# Patient Record
Sex: Female | Born: 1980 | Race: White | Hispanic: No | Marital: Married | Smoking: Former smoker
Health system: Southern US, Community
[De-identification: ages and names within clinical notes are randomized; demographics above are authoritative.]

## PROBLEM LIST (undated history)

## (undated) DIAGNOSIS — T7840XA Allergy, unspecified, initial encounter: Secondary | ICD-10-CM

## (undated) DIAGNOSIS — O039 Complete or unspecified spontaneous abortion without complication: Secondary | ICD-10-CM

## (undated) DIAGNOSIS — F419 Anxiety disorder, unspecified: Secondary | ICD-10-CM

## (undated) DIAGNOSIS — T63331A Toxic effect of venom of brown recluse spider, accidental (unintentional), initial encounter: Secondary | ICD-10-CM

## (undated) DIAGNOSIS — E876 Hypokalemia: Secondary | ICD-10-CM

## (undated) DIAGNOSIS — N838 Other noninflammatory disorders of ovary, fallopian tube and broad ligament: Secondary | ICD-10-CM

## (undated) DIAGNOSIS — Z91018 Allergy to other foods: Secondary | ICD-10-CM

## (undated) DIAGNOSIS — A0472 Enterocolitis due to Clostridium difficile, not specified as recurrent: Secondary | ICD-10-CM

## (undated) DIAGNOSIS — Z3490 Encounter for supervision of normal pregnancy, unspecified, unspecified trimester: Secondary | ICD-10-CM

## (undated) DIAGNOSIS — F32A Depression, unspecified: Secondary | ICD-10-CM

## (undated) DIAGNOSIS — E079 Disorder of thyroid, unspecified: Secondary | ICD-10-CM

## (undated) DIAGNOSIS — I10 Essential (primary) hypertension: Secondary | ICD-10-CM

## (undated) DIAGNOSIS — K219 Gastro-esophageal reflux disease without esophagitis: Secondary | ICD-10-CM

## (undated) HISTORY — DX: Allergy, unspecified, initial encounter: T78.40XA

## (undated) HISTORY — DX: Hypokalemia: E87.6

## (undated) HISTORY — PX: APPENDECTOMY: SHX54

## (undated) HISTORY — DX: Gastro-esophageal reflux disease without esophagitis: K21.9

## (undated) HISTORY — DX: Disorder of thyroid, unspecified: E07.9

## (undated) HISTORY — DX: Allergy to other foods: Z91.018

## (undated) HISTORY — PX: OTHER SURGICAL HISTORY: SHX169

## (undated) HISTORY — DX: Depression, unspecified: F32.A

## (undated) HISTORY — DX: Essential (primary) hypertension: I10

---

## 2010-10-20 ENCOUNTER — Emergency Department (HOSPITAL_COMMUNITY): Admission: EM | Admit: 2010-10-20 | Discharge: 2010-10-20 | Payer: Self-pay | Admitting: Emergency Medicine

## 2011-02-12 LAB — URINE MICROSCOPIC-ADD ON

## 2011-02-12 LAB — DIFFERENTIAL
Basophils Relative: 0 % (ref 0–1)
Eosinophils Absolute: 0.1 10*3/uL (ref 0.0–0.7)
Eosinophils Relative: 1 % (ref 0–5)
Lymphs Abs: 1.7 10*3/uL (ref 0.7–4.0)
Monocytes Absolute: 0.7 10*3/uL (ref 0.1–1.0)
Monocytes Relative: 8 % (ref 3–12)
Neutrophils Relative %: 73 % (ref 43–77)

## 2011-02-12 LAB — URINALYSIS, ROUTINE W REFLEX MICROSCOPIC
Bilirubin Urine: NEGATIVE
Glucose, UA: NEGATIVE mg/dL
Leukocytes, UA: NEGATIVE
Nitrite: NEGATIVE
Specific Gravity, Urine: 1.02 (ref 1.005–1.030)
pH: 5.5 (ref 5.0–8.0)

## 2011-02-12 LAB — BASIC METABOLIC PANEL
CO2: 23 mEq/L (ref 19–32)
Chloride: 109 mEq/L (ref 96–112)
GFR calc Af Amer: >60 mL/min (ref >60)
Glucose, Bld: 85 mg/dL (ref 70–99)
Potassium: 3.4 mEq/L — ABNORMAL LOW (ref 3.5–5.1)
Sodium: 139 mEq/L (ref 135–145)

## 2011-02-12 LAB — CBC
HCT: 38.1 % (ref 36.0–46.0)
Hemoglobin: 12.9 g/dL (ref 12.0–15.0)
MCH: 28.2 pg (ref 26.0–34.0)
MCHC: 33.8 g/dL (ref 30.0–36.0)
MCV: 83.5 fL (ref 78.0–100.0)
RBC: 4.56 MIL/uL (ref 3.87–5.11)

## 2011-02-12 LAB — ABO/RH: ABO/RH(D): O POS

## 2012-07-17 ENCOUNTER — Encounter (HOSPITAL_COMMUNITY): Payer: Self-pay | Admitting: *Deleted

## 2012-07-17 ENCOUNTER — Emergency Department (HOSPITAL_COMMUNITY)
Admission: EM | Admit: 2012-07-17 | Discharge: 2012-07-18 | Disposition: A | Discharge status: Home / Self Care | Payer: Self-pay | Attending: Emergency Medicine | Admitting: Emergency Medicine

## 2012-07-17 DIAGNOSIS — Z91018 Allergy to other foods: Secondary | ICD-10-CM | POA: Insufficient documentation

## 2012-07-17 DIAGNOSIS — O99891 Other specified diseases and conditions complicating pregnancy: Secondary | ICD-10-CM | POA: Insufficient documentation

## 2012-07-17 DIAGNOSIS — M5412 Radiculopathy, cervical region: Secondary | ICD-10-CM

## 2012-07-17 DIAGNOSIS — R109 Unspecified abdominal pain: Secondary | ICD-10-CM | POA: Insufficient documentation

## 2012-07-17 DIAGNOSIS — Z91038 Other insect allergy status: Secondary | ICD-10-CM | POA: Insufficient documentation

## 2012-07-17 DIAGNOSIS — M25519 Pain in unspecified shoulder: Secondary | ICD-10-CM | POA: Insufficient documentation

## 2012-07-17 HISTORY — DX: Encounter for supervision of normal pregnancy, unspecified, unspecified trimester: Z34.90

## 2012-07-17 LAB — URINE MICROSCOPIC-ADD ON

## 2012-07-17 LAB — URINALYSIS, ROUTINE W REFLEX MICROSCOPIC
Bilirubin Urine: NEGATIVE
Nitrite: NEGATIVE
Protein, ur: NEGATIVE mg/dL
Urobilinogen, UA: 0.2 mg/dL (ref 0.0–1.0)

## 2012-07-17 MED ORDER — ACETAMINOPHEN 325 MG PO TABS
650.0000 mg | ORAL_TABLET | Freq: Once | ORAL | Status: AC
  Administered 2012-07-17: 650 mg via ORAL
  Filled 2012-07-17: qty 2

## 2012-07-17 NOTE — ED Notes (Signed)
Abdominal pain , neck and right shoulder pain, states they all hurt at the same time

## 2012-07-17 NOTE — ED Provider Notes (Signed)
History  This chart was scribed for Gwendolyn Nielsen, MD by Erskine Emery. This patient was seen in room APA11/APA11 and the patient's care was started at 22:52.   CSN: [id number]  Arrival date & time 07/17/12  2147   First MD Initiated Contact with Patient 07/17/12 2252      Chief Complaint  Patient presents with  . Abdominal Pain    (Consider location/radiation/quality/duration/timing/severity/associated sxs/prior treatment) HPI Gwendolyn Glass is a 31 y.o. female who presents to the Emergency Department complaining of sudden onset constant shooting pain described as "worse than contractions" from the right shoulder through that arm since yesterday with associated arm numbness and sleep disturbance from the pain. Pt denies any weakness or injury and reports she was doing nothing abnormal when the pain began. Pt has been using a heating pad that mildly relieves the pain, as does lifting the arm. Pt has no h/o episodes of similar symptoms. Pt also reports some abdominal pain, possibly attributable to gas, that "feels like I'm about to explode."  Pt has a daughter who is 90 year old and reports when she was pregnant with her she had arm issues but the pain was different and in the other arm. Pt's LNMP was July 19th but she reports she took a home pregnancy test since then that was positive. Pt is G4P2A2.  Pt has no PCP currently but saw an OB at the [facility] clinic in [place] during her last pregnancy.      Past Medical History  Diagnosis Date  . Pregnant     Past Surgical History  Procedure Date  . Appendectomy   . Cesarean section     No family history on file.  History  Substance Use Topics  . Smoking status: Never Smoker   . Smokeless tobacco: Not on file  . Alcohol Use: No    OB History    Grav Para Term Preterm Abortions TAB SAB Ect Mult Living                  Review of Systems  Constitutional: Negative for fatigue.  HENT: Positive for neck pain. Negative for congestion,  sinus pressure and ear discharge.   Eyes: Negative for discharge.  Respiratory: Negative for cough.   Cardiovascular: Negative for chest pain.  Gastrointestinal: Positive for abdominal pain. Negative for diarrhea.  Genitourinary: Negative for frequency and hematuria.  Musculoskeletal:       Right shoulder and arm pain.   Skin: Negative for rash.  Neurological: Negative for seizures and headaches.  Hematological: Negative.   Psychiatric/Behavioral: Negative for hallucinations.  All other systems reviewed and are negative.    Allergies  Cinnamon and Bee venom  Home Medications   Current Outpatient Rx  Name Route Sig Dispense Refill  . ACETAMINOPHEN 500 MG PO TABS Oral Take 1,000 mg by mouth once as needed.      Triage Vitals: BP 128/81  Pulse 96  Temp 98.2 F (36.8 C)  Resp 20  Ht 5\' 8"  (1.727 m)  Wt 200 lb (90.719 kg)  BMI 30.41 kg/m2  SpO2 100%  LMP 06/19/2012  Physical Exam  Nursing note and vitals reviewed. Constitutional: She is oriented to person, place, and time. She appears well-developed and well-nourished. No distress.  HENT:  Head: Normocephalic and atraumatic.  Eyes: EOM are normal. Pupils are equal, round, and reactive to light.  Neck: Neck supple. No tracheal deviation present.       No deformity or tenderness over AC.  Cardiovascular: Normal rate.   Pulmonary/Chest: Effort normal. No respiratory distress.  Abdominal: Soft. She exhibits no distension.  Musculoskeletal: Normal range of motion. She exhibits no edema.       No cervical spine tenderness. Reproducible arm pain with lateral rotation of neck. Reflexes are intact.   Neurological: She is alert and oriented to person, place, and time.  Skin: Skin is warm and dry.  Psychiatric: She has a normal mood and affect.    ED Course  Procedures (including critical care time) DIAGNOSTIC STUDIES: Oxygen Saturation is 100% on room air, normal by my interpretation.    COORDINATION OF CARE: 23:24--I  evaluated the patient and we discussed a treatment plan including a sling to be worn for comfort to which the pt agreed. We discussed that radiation and medication options are limited by her potential pregnancy. I recommended she use ice and suggested she follow up with her OBGYN or an orthopedic surgeon. I recommended that she take the Tylenol every 6 hours and limit neck movement.  presentation suggests cervical radiculopathy  Ice, tylenol, sling for comfort provided.   Given positive home pregnancy test x2, will treat with Tylenol and avoid NSAIDs otherwise.  Orthopedic referral provided. Patient plans to follow up with women's clinic in ED. Neck pain and radiculopathy precautions verbalizes understood. MDM  Nursing notes reviewed. Patient denies any abdominal pain, vaginal bleeding or pregnancy/ ectopic related symptoms.vital signs reviewed.no deficits or indication for emergent imaging at this time. Plan conservative treatment with OB/GYN followup and orthopedic referral as needed.   I personally performed the services described in this documentation, which was scribed in my presence. The recorded information has been reviewed and considered.        Gwendolyn Nielsen, MD 07/18/12 [phone]

## 2012-07-17 NOTE — ED Notes (Signed)
States she had a positive pregnancy test 2 days ago

## 2012-07-17 NOTE — ED Notes (Signed)
Discharge instructions reviewed with pt, questions answered. Pt verbalized understanding.  

## 2012-07-18 MED ORDER — ACETAMINOPHEN 500 MG PO TABS: 500.0000 mg | ORAL_TABLET | Freq: Four times a day (QID) | ORAL | Status: AC | PRN

## 2012-07-26 ENCOUNTER — Encounter (HOSPITAL_COMMUNITY): Payer: Self-pay | Admitting: Emergency Medicine

## 2012-07-26 ENCOUNTER — Emergency Department (HOSPITAL_COMMUNITY): Payer: BC Managed Care – PPO

## 2012-07-26 ENCOUNTER — Inpatient Hospital Stay (HOSPITAL_COMMUNITY)
Admission: EM | Admit: 2012-07-26 | Discharge: 2012-07-29 | DRG: 494 | Disposition: A | Discharge status: Home / Self Care | Payer: BC Managed Care – PPO | Attending: General Surgery | Admitting: General Surgery

## 2012-07-26 DIAGNOSIS — K8050 Calculus of bile duct without cholangitis or cholecystitis without obstruction: Secondary | ICD-10-CM

## 2012-07-26 DIAGNOSIS — K810 Acute cholecystitis: Principal | ICD-10-CM | POA: Diagnosis present

## 2012-07-26 HISTORY — DX: Complete or unspecified spontaneous abortion without complication: O03.9

## 2012-07-26 LAB — COMPREHENSIVE METABOLIC PANEL
AST: 15 U/L (ref 0–37)
BUN: 7 mg/dL (ref 6–23)
CO2: 24 mEq/L (ref 19–32)
Chloride: 101 mEq/L (ref 96–112)
Creatinine, Ser: 0.68 mg/dL (ref 0.50–1.10)
GFR calc non Af Amer: >90 mL/min (ref >90)
Glucose, Bld: 104 mg/dL — ABNORMAL HIGH (ref 70–99)
Total Bilirubin: 0.2 mg/dL — ABNORMAL LOW (ref 0.3–1.2)

## 2012-07-26 LAB — LIPASE, BLOOD: Lipase: 21 U/L (ref 11–59)

## 2012-07-26 LAB — CBC WITH DIFFERENTIAL/PLATELET
HCT: 42.9 % (ref 36.0–46.0)
Hemoglobin: 14.5 g/dL (ref 12.0–15.0)
Lymphocytes Relative: 21 % (ref 12–46)
Lymphs Abs: 2 10*3/uL (ref 0.7–4.0)
Monocytes Absolute: 0.6 10*3/uL (ref 0.1–1.0)
Monocytes Relative: 6 % (ref 3–12)
Neutro Abs: 6.8 10*3/uL (ref 1.7–7.7)
WBC: 9.6 10*3/uL (ref 4.0–10.5)

## 2012-07-26 LAB — HCG, QUANTITATIVE, PREGNANCY: hCG, Beta Chain, Quant, S: 277 m[IU]/mL — ABNORMAL HIGH (ref <5)

## 2012-07-26 MED ORDER — SODIUM CHLORIDE 0.9 % IV SOLN: 1000.0000 mL | Freq: Once | INTRAVENOUS | Status: DC

## 2012-07-26 MED ORDER — FENTANYL CITRATE 0.05 MG/ML IJ SOLN
100.0000 ug | Freq: Once | INTRAMUSCULAR | Status: AC
  Administered 2012-07-26: 100 ug via INTRAVENOUS
  Filled 2012-07-26: qty 2

## 2012-07-26 MED ORDER — LACTATED RINGERS IV SOLN
INTRAVENOUS | Status: DC
  Administered 2012-07-26 – 2012-07-27 (×4): via INTRAVENOUS

## 2012-07-26 MED ORDER — SODIUM CHLORIDE 0.9 % IV SOLN: 1000.0000 mL | INTRAVENOUS | Status: DC

## 2012-07-26 MED ORDER — HYDROMORPHONE HCL PF 1 MG/ML IJ SOLN
1.0000 mg | Freq: Once | INTRAMUSCULAR | Status: AC
  Administered 2012-07-26: 1 mg via INTRAVENOUS
  Filled 2012-07-26: qty 1

## 2012-07-26 MED ORDER — ONDANSETRON HCL 4 MG/2ML IJ SOLN
4.0000 mg | Freq: Once | INTRAMUSCULAR | Status: AC
  Administered 2012-07-26: 4 mg via INTRAVENOUS
  Filled 2012-07-26: qty 2

## 2012-07-26 MED ORDER — SODIUM CHLORIDE 0.9 % IV SOLN
1000.0000 mL | Freq: Once | INTRAVENOUS | Status: AC
  Administered 2012-07-26: 1000 mL via INTRAVENOUS

## 2012-07-26 MED ORDER — SODIUM CHLORIDE 0.9 % IJ SOLN
INTRAMUSCULAR | Status: AC
  Administered 2012-07-26: 22:00:00
  Filled 2012-07-26: qty 3

## 2012-07-26 MED ORDER — HYDROMORPHONE HCL PF 1 MG/ML IJ SOLN
1.0000 mg | INTRAMUSCULAR | Status: DC | PRN
  Administered 2012-07-26 – 2012-07-27 (×3): 2 mg via INTRAVENOUS
  Filled 2012-07-26 (×3): qty 2

## 2012-07-26 MED ORDER — SODIUM CHLORIDE 0.9 % IV BOLUS (SEPSIS)
500.0000 mL | Freq: Once | INTRAVENOUS | Status: AC
  Administered 2012-07-26: 500 mL via INTRAVENOUS

## 2012-07-26 MED ORDER — DIPHENHYDRAMINE HCL 50 MG/ML IJ SOLN
25.0000 mg | Freq: Once | INTRAMUSCULAR | Status: AC
  Administered 2012-07-26: 25 mg via INTRAVENOUS
  Filled 2012-07-26: qty 1

## 2012-07-26 MED ORDER — ENOXAPARIN SODIUM 40 MG/0.4ML ~~LOC~~ SOLN
40.0000 mg | SUBCUTANEOUS | Status: DC
  Administered 2012-07-27 – 2012-07-29 (×3): 40 mg via SUBCUTANEOUS
  Filled 2012-07-26 (×3): qty 0.4

## 2012-07-26 MED ORDER — PANTOPRAZOLE SODIUM 40 MG IV SOLR
40.0000 mg | Freq: Every day | INTRAVENOUS | Status: DC
  Administered 2012-07-26 – 2012-07-28 (×3): 40 mg via INTRAVENOUS
  Filled 2012-07-26 (×3): qty 40

## 2012-07-26 MED ORDER — ONDANSETRON HCL 4 MG/2ML IJ SOLN
4.0000 mg | Freq: Four times a day (QID) | INTRAMUSCULAR | Status: DC | PRN
  Administered 2012-07-27 – 2012-07-28 (×2): 4 mg via INTRAVENOUS
  Filled 2012-07-26 (×3): qty 2

## 2012-07-26 MED ORDER — SODIUM CHLORIDE 0.9 % IV SOLN: INTRAVENOUS | Status: AC

## 2012-07-26 NOTE — ED Notes (Signed)
Patient ambulatory to restroom  ?

## 2012-07-26 NOTE — ED Notes (Signed)
Records faxed from [place], given to Dr. Rosalia Hammers

## 2012-07-26 NOTE — ED Provider Notes (Signed)
History   This chart was scribed for Hilario Quarry, MD by Charolett Bumpers . The patient was seen in room APA14/APA14. Patient's care was started at 1534.    CSN: [id number]  Arrival date & time 07/26/12  1447   First MD Initiated Contact with Patient 07/26/12 1534      Chief Complaint  Patient presents with  . Abdominal Pain    (Consider location/radiation/quality/duration/timing/severity/associated sxs/prior treatment) HPI Gwendolyn Glass is a 31 y.o. female who presents to the Emergency Department complaining of constant, moderate RUQ abdominal pain for the past 1.5 weeks. Pt also complains of right shoulder pain that started just prior to the abdominal pain. Pt reports associated nausea, decreased appetite, fevers and chills. Pt denies any vomiting. Pt denies any modifying factors. Pt states that she has been evaluated here at [facility] ED and at [facility] where she was told she was pregnant and had gall stones. Pt was to told to f/u with OB?GYN but states they would no see her until blood work was obtained. Pt reports that she had blood work and an ultrasound on 07/21/12. Pt states that she had another ultrasound at [facility] and repeat blood work in which she found out she had miscarriage. Pt reports associated vaginal bleeding for the past 2 days that continues. Pt denies any vaginal discharge. Pt denies any other prior medical hx. OB hx: P:2 G:0 A:2   Past Medical History  Diagnosis Date  . Pregnant     Past Surgical History  Procedure Date  . Appendectomy   . Cesarean section     No family history on file.  History  Substance Use Topics  . Smoking status: Never Smoker   . Smokeless tobacco: Not on file  . Alcohol Use: No    OB History    Grav Para Term Preterm Abortions TAB SAB Ect Mult Living                  Review of Systems  Constitutional: Positive for fever, chills and appetite change.  Gastrointestinal: Positive for nausea and abdominal  pain. Negative for vomiting.  Genitourinary: Positive for vaginal bleeding. Negative for vaginal discharge.  Musculoskeletal: Positive for myalgias.       Right shoulder pain.   All other systems reviewed and are negative.    Allergies  Cinnamon and Bee venom  Home Medications   Current Outpatient Rx  Name Route Sig Dispense Refill  . ACETAMINOPHEN 500 MG PO TABS Oral Take 1,000 mg by mouth once as needed.    . ACETAMINOPHEN 500 MG PO TABS Oral Take 1 tablet (500 mg total) by mouth every 6 (six) hours as needed for pain. 30 tablet 0    BP 144/99  Pulse 120  Temp 98.7 F (37.1 C) (Oral)  Resp 18  Ht 5\' 8"  (1.727 m)  Wt 200 lb (90.719 kg)  BMI 30.41 kg/m2  SpO2 100%  LMP 06/19/2012  Physical Exam  Nursing note and vitals reviewed. Constitutional: She is oriented to person, place, and time. She appears well-developed and well-nourished. No distress.  HENT:  Head: Normocephalic and atraumatic.  Eyes: EOM are normal. Pupils are equal, round, and reactive to light.  Neck: Normal range of motion. Neck supple. No tracheal deviation present.  Cardiovascular: Normal rate, regular rhythm and normal heart sounds.   Pulmonary/Chest: Effort normal and breath sounds normal. No respiratory distress.  Abdominal: Soft. Bowel sounds are normal. She exhibits no distension. There is tenderness.  Tenderness in RUQ.   Musculoskeletal: Normal range of motion. She exhibits no edema.  Neurological: She is alert and oriented to person, place, and time. No sensory deficit.  Skin: Skin is warm and dry.  Psychiatric: She has a normal mood and affect. Her behavior is normal.    ED Course  Procedures (including critical care time)  DIAGNOSTIC STUDIES: Oxygen Saturation is 100% on room air, normal by my interpretation.    COORDINATION OF CARE:  15:44-Discussed planned course of treatment with the patient, who is agreeable at this time. Will obtain records from [place]. Pt agrees with  plan.   16:00-Medication Orders: Ondansetron (Zofran) injection 4 mg-once; Hydromorphone (Dilaudid) injection 1 mg-once; Sodium chloride 0.9% bolus 500 mL-once.   Labs Reviewed - No data to display No results found.   No diagnosis found.  Previous records reviewed from [facility]. He did have a abdominal ultrasound on 820 that showed an 8 mm stone in the gallbladder but no evidence for acute cholecystitis. She was seen there on several occasions and had decreasing quantitative beta beta hCGs treatment decreasing from a high of 377 down to 325 and today is 250. She had an ultrasound that did not show any evidence of intrauterine pregnancy and has since began having vaginal bleeding consistent with miscarriage. She denies any lower abdominal pain.  MDM  Patient care discussed with Dr. Leticia Penna. I do not find any other etiology for the right shoulder plain pain and right upper quadrant pain including evidence for pulmonary embolism, pneumothorax, or pneumonia. Dr. Leticia Penna will hospitalize the patient in preparation for cholecystectomy.  I personally performed the services described in this documentation, which was scribed in my presence. The recorded information has been reviewed and considered.       Hilario Quarry, MD 07/26/12 [phone]

## 2012-07-26 NOTE — ED Notes (Signed)
Pt right arm pain that started about a week and half ago, right upper quad abd pain that started a week ago, states that she has problems with gallstones but since she found out she was pregnant she is not able to follow up with GI.

## 2012-07-26 NOTE — ED Notes (Signed)
Pt returned from xray, states that she is feeling better, pt assisted to restroom, tolerated well

## 2012-07-26 NOTE — ED Notes (Addendum)
Pt c/o small amount of itching, no redness or sob noted, pt states that the itching was only for a few minutes after getting the dilaudid. Pt also requesting more pain medication, Dr. Rosalia Hammers notified, additional orders given

## 2012-07-26 NOTE — ED Notes (Signed)
Patient with c/o RUQ abdominal pain for several weeks. H/o gallstone in bile duct recently. Patient attempted to follow up with GI, unable to due so due to pregnancy. Has seen OB for pregnancy. Patient reports she is "in the middle of a miscarriage", vaginal bleeding with clots at present.

## 2012-07-26 NOTE — ED Notes (Signed)
Patient informed that she is to be NPO

## 2012-07-27 LAB — COMPREHENSIVE METABOLIC PANEL
Alkaline Phosphatase: 71 U/L (ref 39–117)
BUN: 6 mg/dL (ref 6–23)
Chloride: 107 mEq/L (ref 96–112)
GFR calc Af Amer: >90 mL/min (ref >90)
Glucose, Bld: 94 mg/dL (ref 70–99)
Potassium: 3.8 mEq/L (ref 3.5–5.1)
Total Bilirubin: 0.3 mg/dL (ref 0.3–1.2)

## 2012-07-27 LAB — CBC
Platelets: 177 10*3/uL (ref 150–400)
RDW: 13.6 % (ref 11.5–15.5)
WBC: 7.5 10*3/uL (ref 4.0–10.5)

## 2012-07-27 LAB — SURGICAL PCR SCREEN
MRSA, PCR: NEGATIVE
Staphylococcus aureus: POSITIVE — AB

## 2012-07-27 MED ORDER — MORPHINE SULFATE 4 MG/ML IJ SOLN
1.0000 mg | INTRAMUSCULAR | Status: DC | PRN
  Administered 2012-07-27 – 2012-07-28 (×5): 4 mg via INTRAVENOUS
  Filled 2012-07-27 (×6): qty 1

## 2012-07-27 MED ORDER — HYDROMORPHONE HCL PF 1 MG/ML IJ SOLN
1.0000 mg | INTRAMUSCULAR | Status: DC | PRN
  Administered 2012-07-27 (×2): 2 mg via INTRAVENOUS
  Filled 2012-07-27 (×2): qty 2

## 2012-07-27 MED ORDER — SODIUM CHLORIDE 0.9 % IJ SOLN
INTRAMUSCULAR | Status: AC
  Administered 2012-07-27
  Filled 2012-07-27: qty 3

## 2012-07-27 MED ORDER — CEFAZOLIN SODIUM-DEXTROSE 2-3 GM-% IV SOLR
2.0000 g | INTRAVENOUS | Status: DC
  Filled 2012-07-27: qty 50

## 2012-07-27 NOTE — H&P (Signed)
Gwendolyn Glass is an 31 y.o. female.   Chief Complaint: Right upper quadrant abdominal pain. HPI: Patient presented to [facility] ED with over 1 week of Upper abdominal and shoulder pain.  Patient states that the pain has been persistent. Patient states increased pain with PO intake.  + fever and chills.  Associated nausea but no emesis.  No change with BM.  No melena or hematochezia.  No similar symptoms in the past.  + family history of biliary disease.  No jaundice. Recent miss carage.    Past Medical History  Diagnosis Date  . Pregnant     Past Surgical History  Procedure Date  . Appendectomy   . Cesarean section     History reviewed. No pertinent family history. Social History:  reports that she has never smoked. She does not have any smokeless tobacco history on file. She reports that she drinks alcohol. She reports that she does not use illicit drugs.  Allergies:  Allergies  Allergen Reactions  . Cinnamon Swelling and Other (See Comments)    REACTION: throat swells  . Bee Venom Swelling and Other (See Comments)    REACTION: All over body swelling    Medications Prior to Admission  Medication Sig Dispense Refill  . acetaminophen (TYLENOL) 500 MG tablet Take 1 tablet (500 mg total) by mouth every 6 (six) hours as needed for pain.  30 tablet  0  . HYDROcodone-acetaminophen (NORCO/VICODIN) 5-325 MG per tablet Take 1 tablet by mouth every 6 (six) hours as needed. pain      . Multiple Vitamin (MULTIVITAMIN) tablet Take 1 tablet by mouth daily.        Results for orders placed during the hospital encounter of 07/26/12 (from the past 48 hour(s))  CBC WITH DIFFERENTIAL     Status: Abnormal   Collection Time   07/26/12  4:10 PM      Component Value Range Comment   WBC 9.6  4.0 - 10.5 K/uL    RBC 5.15 (*) 3.87 - 5.11 MIL/uL    Hemoglobin 14.5  12.0 - 15.0 g/dL    HCT 16.1  09.6 - 04.5 %    MCV 83.3  78.0 - 100.0 fL    MCH 28.2  26.0 - 34.0 pg    MCHC 33.8  30.0 - 36.0 g/dL    RDW  40.9  81.1 - 91.4 %    Platelets 199  150 - 400 K/uL    Neutrophils Relative 71  43 - 77 %    Neutro Abs 6.8  1.7 - 7.7 K/uL    Lymphocytes Relative 21  12 - 46 %    Lymphs Abs 2.0  0.7 - 4.0 K/uL    Monocytes Relative 6  3 - 12 %    Monocytes Absolute 0.6  0.1 - 1.0 K/uL    Eosinophils Relative 1  0 - 5 %    Eosinophils Absolute 0.1  0.0 - 0.7 K/uL    Basophils Relative 0  0 - 1 %    Basophils Absolute 0.0  0.0 - 0.1 K/uL   COMPREHENSIVE METABOLIC PANEL     Status: Abnormal   Collection Time   07/26/12  4:10 PM      Component Value Range Comment   Sodium 136  135 - 145 mEq/L    Potassium 3.9  3.5 - 5.1 mEq/L    Chloride 101  96 - 112 mEq/L    CO2 24  19 - 32 mEq/L  Glucose, Bld 104 (*) 70 - 99 mg/dL    BUN 7  6 - 23 mg/dL    Creatinine, Ser 1.61  0.50 - 1.10 mg/dL    Calcium 9.8  8.4 - 09.6 mg/dL    Total Protein 7.2  6.0 - 8.3 g/dL    Albumin 4.2  3.5 - 5.2 g/dL    AST 15  0 - 37 U/L    ALT 15  0 - 35 U/L    Alkaline Phosphatase 85  39 - 117 U/L    Total Bilirubin 0.2 (*) 0.3 - 1.2 mg/dL    GFR calc non Af Amer >90  >90 mL/min    GFR calc Af Amer >90  >90 mL/min   LIPASE, BLOOD     Status: Normal   Collection Time   07/26/12  4:10 PM      Component Value Range Comment   Lipase 21  11 - 59 U/L   HCG, QUANTITATIVE, PREGNANCY     Status: Abnormal   Collection Time   07/26/12  4:10 PM      Component Value Range Comment   hCG, Beta Chain, Quant, S 277 (*) <5 mIU/mL   D-DIMER, QUANTITATIVE     Status: Normal   Collection Time   07/26/12  4:33 PM      Component Value Range Comment   D-Dimer, Quant 0.39  0.00 - 0.48 ug/mL-FEU   SURGICAL PCR SCREEN     Status: Abnormal   Collection Time   07/26/12 11:41 PM      Component Value Range Comment   MRSA, PCR NEGATIVE  NEGATIVE    Staphylococcus aureus POSITIVE (*) NEGATIVE   COMPREHENSIVE METABOLIC PANEL     Status: Normal   Collection Time   07/27/12  5:00 AM      Component Value Range Comment   Sodium 139  135 - 145 mEq/L      Potassium 3.8  3.5 - 5.1 mEq/L    Chloride 107  96 - 112 mEq/L    CO2 27  19 - 32 mEq/L    Glucose, Bld 94  70 - 99 mg/dL    BUN 6  6 - 23 mg/dL    Creatinine, Ser 0.45  0.50 - 1.10 mg/dL    Calcium 9.1  8.4 - 40.9 mg/dL    Total Protein 6.3  6.0 - 8.3 g/dL    Albumin 3.5  3.5 - 5.2 g/dL    AST 15  0 - 37 U/L    ALT 13  0 - 35 U/L    Alkaline Phosphatase 71  39 - 117 U/L    Total Bilirubin 0.3  0.3 - 1.2 mg/dL    GFR calc non Af Amer >90  >90 mL/min    GFR calc Af Amer >90  >90 mL/min   CBC     Status: Normal   Collection Time   07/27/12  5:00 AM      Component Value Range Comment   WBC 7.5  4.0 - 10.5 K/uL    RBC 4.77  3.87 - 5.11 MIL/uL    Hemoglobin 13.3  12.0 - 15.0 g/dL    HCT 81.1  91.4 - 78.2 %    MCV 84.7  78.0 - 100.0 fL    MCH 27.9  26.0 - 34.0 pg    MCHC 32.9  30.0 - 36.0 g/dL    RDW 95.6  21.3 - 08.6 %    Platelets 177  150 -  400 K/uL    Dg Chest 2 View  07/26/2012  *RADIOLOGY REPORT*  Clinical Data: Chest pain and tachycardia.  CHEST - 2 VIEW  Comparison: 07/20/2012.  Findings: The cardiac silhouette, mediastinal and hilar contours are within normal limits and stable. The lungs are clear.  No pleural effusions.  The bony thorax is intact.  IMPRESSION: Normal chest x-ray.   Original Report Authenticated By: P. Loralie Champagne, M.D.     Review of Systems  Constitutional: Positive for fever, chills and malaise/fatigue.  HENT: Negative.   Eyes: Negative.   Respiratory: Negative.   Cardiovascular: Negative.   Gastrointestinal: Positive for heartburn, nausea and abdominal pain (RUQ). Negative for vomiting, diarrhea, constipation, blood in stool and melena.  Genitourinary: Negative.   Musculoskeletal: Negative.   Skin: Negative for itching and rash.  Neurological: Negative.   Endo/Heme/Allergies: Negative.   Psychiatric/Behavioral: Negative.     Blood pressure 99/67, pulse 82, temperature 98 F (36.7 C), temperature source Oral, resp. rate 20, height 5\' 8"   (1.727 m), weight 99.7 kg (219 lb 12.8 oz), last menstrual period 06/19/2012, SpO2 97.00%. Physical Exam  Constitutional: She is oriented to person, place, and time. She appears well-developed and well-nourished. No distress.  HENT:  Head: Normocephalic and atraumatic.  Eyes: Conjunctivae and EOM are normal. Pupils are equal, round, and reactive to light. No scleral icterus.  Neck: Normal range of motion. Neck supple. No tracheal deviation present. No thyromegaly present.  Cardiovascular: Normal rate, regular rhythm and normal heart sounds.   Respiratory: Effort normal. No respiratory distress. She has no wheezes.  GI: Soft. Bowel sounds are normal. She exhibits no distension and no mass. There is tenderness (positive right upper quadrant abdominal tenderness. Positive Murphy sign.). There is rebound. There is no guarding.  Musculoskeletal: Normal range of motion.  Lymphadenopathy:    She has no cervical adenopathy.  Neurological: She is alert and oriented to person, place, and time.  Skin: Skin is warm and dry.     Assessment/Plan Acute cholecystitis. Risks benefits and alternatives to surgical intervention were discussed. Patient would be admitted for continued monitoring and IV fluid hydration. We'll plan to proceed to the apocrine the next 24 hours with planned laparoscopic possible open cholecystectomy. Her questions and concerns were addressed. Chalene Treu C 07/27/2012, 11:54 AM

## 2012-07-27 NOTE — Care Management Note (Unsigned)
    Page 1 of 1   07/27/2012     3:05:57 PM   CARE MANAGEMENT NOTE 07/27/2012  Patient:  Gwendolyn Glass, Gwendolyn Glass   Account Number:  [id number]  Date Initiated:  07/27/2012  Documentation initiated by:  Sharrie Rothman  Subjective/Objective Assessment:   Pt admitted from home with abd pain. Pt scheduled for surgery on 07/28/12. Pt lives husband and will return home with him at discharge. Pt is independent with ADL's.     Action/Plan:   No CM/HH needs noted.   Anticipated DC Date:  07/29/2012   Anticipated DC Plan:  HOME/SELF CARE      DC Planning Services  CM consult      Choice offered to / List presented to:             Status of service:  In process, will continue to follow Medicare Important Message given?   (If response is "NO", the following Medicare IM given date fields will be blank) Date Medicare IM given:   Date Additional Medicare IM given:    Discharge Disposition:    Per UR Regulation:    If discussed at Long Length of Stay Meetings, dates discussed:    Comments:  07/27/12 1505 Arlyss Queen, RN BSN CM

## 2012-07-28 ENCOUNTER — Encounter (HOSPITAL_COMMUNITY): Payer: Self-pay | Admitting: *Deleted

## 2012-07-28 ENCOUNTER — Inpatient Hospital Stay (HOSPITAL_COMMUNITY): Payer: BC Managed Care – PPO | Admitting: Anesthesiology

## 2012-07-28 ENCOUNTER — Encounter (HOSPITAL_COMMUNITY)
Admission: EM | Disposition: A | Discharge status: Home / Self Care | Payer: Self-pay | Source: Home / Self Care | Attending: General Surgery

## 2012-07-28 ENCOUNTER — Encounter (HOSPITAL_COMMUNITY): Payer: Self-pay | Admitting: Anesthesiology

## 2012-07-28 HISTORY — PX: CHOLECYSTECTOMY: SHX55

## 2012-07-28 LAB — BASIC METABOLIC PANEL
Chloride: 106 mEq/L (ref 96–112)
Creatinine, Ser: 0.73 mg/dL (ref 0.50–1.10)
GFR calc Af Amer: >90 mL/min (ref >90)
GFR calc non Af Amer: >90 mL/min (ref >90)

## 2012-07-28 LAB — CBC WITH DIFFERENTIAL/PLATELET
Basophils Absolute: 0 10*3/uL (ref 0.0–0.1)
Basophils Relative: 0 % (ref 0–1)
Eosinophils Absolute: 0.1 10*3/uL (ref 0.0–0.7)
HCT: 39 % (ref 36.0–46.0)
MCH: 28 pg (ref 26.0–34.0)
MCHC: 33.1 g/dL (ref 30.0–36.0)
Monocytes Absolute: 0.5 10*3/uL (ref 0.1–1.0)
Neutro Abs: 3.5 10*3/uL (ref 1.7–7.7)
Neutrophils Relative %: 59 % (ref 43–77)
RDW: 13.5 % (ref 11.5–15.5)

## 2012-07-28 SURGERY — LAPAROSCOPIC CHOLECYSTECTOMY
Anesthesia: General | Wound class: Contaminated

## 2012-07-28 MED ORDER — ROCURONIUM BROMIDE 100 MG/10ML IV SOLN
INTRAVENOUS | Status: DC | PRN
  Administered 2012-07-28: 25 mg via INTRAVENOUS
  Administered 2012-07-28: 5 mg via INTRAVENOUS

## 2012-07-28 MED ORDER — FENTANYL CITRATE 0.05 MG/ML IJ SOLN
25.0000 ug | INTRAMUSCULAR | Status: DC | PRN
  Administered 2012-07-28 (×4): 50 ug via INTRAVENOUS

## 2012-07-28 MED ORDER — SUCCINYLCHOLINE CHLORIDE 20 MG/ML IJ SOLN
INTRAMUSCULAR | Status: DC | PRN
Start: 2012-07-28 — End: 2012-07-28
  Administered 2012-07-28: 120 mg via INTRAVENOUS

## 2012-07-28 MED ORDER — SODIUM CHLORIDE 0.9 % IR SOLN
Status: DC | PRN
  Administered 2012-07-28: 3000 mL

## 2012-07-28 MED ORDER — BUPIVACAINE HCL (PF) 0.5 % IJ SOLN
INTRAMUSCULAR | Status: DC | PRN
  Administered 2012-07-28: 10 mL

## 2012-07-28 MED ORDER — HYDROCODONE-ACETAMINOPHEN 5-325 MG PO TABS
1.0000 | ORAL_TABLET | Freq: Four times a day (QID) | ORAL | Status: DC | PRN
  Administered 2012-07-28 – 2012-07-29 (×2): 2 via ORAL
  Filled 2012-07-28 (×2): qty 2

## 2012-07-28 MED ORDER — NEOSTIGMINE METHYLSULFATE 1 MG/ML IJ SOLN
INTRAMUSCULAR | Status: DC | PRN
  Administered 2012-07-28: 4 mg via INTRAVENOUS

## 2012-07-28 MED ORDER — MUPIROCIN 2 % EX OINT
TOPICAL_OINTMENT | Freq: Two times a day (BID) | CUTANEOUS | Status: DC
  Administered 2012-07-28: 22:00:00 via NASAL

## 2012-07-28 MED ORDER — FENTANYL CITRATE 0.05 MG/ML IJ SOLN
INTRAMUSCULAR | Status: AC
  Filled 2012-07-28: qty 2

## 2012-07-28 MED ORDER — CELECOXIB 100 MG PO CAPS
200.0000 mg | ORAL_CAPSULE | Freq: Two times a day (BID) | ORAL | Status: DC
  Administered 2012-07-28 – 2012-07-29 (×3): 200 mg via ORAL
  Filled 2012-07-28 (×3): qty 2

## 2012-07-28 MED ORDER — GLYCOPYRROLATE 0.2 MG/ML IJ SOLN
INTRAMUSCULAR | Status: DC | PRN
  Administered 2012-07-28: 0.6 mg via INTRAVENOUS

## 2012-07-28 MED ORDER — MIDAZOLAM HCL 2 MG/2ML IJ SOLN
1.0000 mg | INTRAMUSCULAR | Status: DC | PRN
  Administered 2012-07-28: 2 mg via INTRAVENOUS

## 2012-07-28 MED ORDER — LIDOCAINE HCL (CARDIAC) 10 MG/ML IV SOLN
INTRAVENOUS | Status: DC | PRN
  Administered 2012-07-28: 50 mg via INTRAVENOUS

## 2012-07-28 MED ORDER — MORPHINE SULFATE 2 MG/ML IJ SOLN
1.0000 mg | INTRAMUSCULAR | Status: DC | PRN
  Administered 2012-07-28 – 2012-07-29 (×4): 4 mg via INTRAVENOUS
  Filled 2012-07-28 (×4): qty 2

## 2012-07-28 MED ORDER — FENTANYL CITRATE 0.05 MG/ML IJ SOLN
INTRAMUSCULAR | Status: DC | PRN
  Administered 2012-07-28 (×4): 50 ug via INTRAVENOUS
  Administered 2012-07-28 (×2): 100 ug via INTRAVENOUS
  Administered 2012-07-28: 50 ug via INTRAVENOUS

## 2012-07-28 MED ORDER — ONDANSETRON HCL 4 MG/2ML IJ SOLN
INTRAMUSCULAR | Status: AC
  Filled 2012-07-28: qty 2

## 2012-07-28 MED ORDER — SODIUM CHLORIDE 0.9 % IJ SOLN
INTRAMUSCULAR | Status: AC
  Administered 2012-07-28: 3 mL
  Filled 2012-07-28: qty 3

## 2012-07-28 MED ORDER — CEFAZOLIN SODIUM 1-5 GM-% IV SOLN
INTRAVENOUS | Status: DC | PRN
  Administered 2012-07-28: 2 g via INTRAVENOUS

## 2012-07-28 MED ORDER — LACTATED RINGERS IV SOLN
INTRAVENOUS | Status: DC
  Administered 2012-07-28: 12:00:00 via INTRAVENOUS

## 2012-07-28 MED ORDER — SODIUM CHLORIDE 0.9 % IR SOLN
Status: DC | PRN
  Administered 2012-07-28: 1000 mL

## 2012-07-28 MED ORDER — LACTATED RINGERS IV SOLN
INTRAVENOUS | Status: DC | PRN
  Administered 2012-07-28 (×2): via INTRAVENOUS

## 2012-07-28 MED ORDER — PROPOFOL 10 MG/ML IV EMUL
INTRAVENOUS | Status: DC | PRN
  Administered 2012-07-28: 150 mg via INTRAVENOUS

## 2012-07-28 MED ORDER — ONDANSETRON HCL 4 MG/2ML IJ SOLN
4.0000 mg | Freq: Once | INTRAMUSCULAR | Status: AC
  Administered 2012-07-28: 4 mg via INTRAVENOUS

## 2012-07-28 MED ORDER — ONDANSETRON HCL 4 MG/2ML IJ SOLN: 4.0000 mg | Freq: Once | INTRAMUSCULAR | Status: DC | PRN

## 2012-07-28 SURGICAL SUPPLY — 37 items
APPLIER CLIP UNV 5X34 EPIX (ENDOMECHANICALS) ×2 IMPLANT
BAG HAMPER (MISCELLANEOUS) ×2 IMPLANT
BENZOIN TINCTURE PRP APPL 2/3 (GAUZE/BANDAGES/DRESSINGS) ×2 IMPLANT
CLOTH BEACON ORANGE TIMEOUT ST (SAFETY) ×2 IMPLANT
COVER LIGHT HANDLE STERIS (MISCELLANEOUS) ×4 IMPLANT
DECANTER SPIKE VIAL GLASS SM (MISCELLANEOUS) ×2 IMPLANT
DEVICE TROCAR PUNCTURE CLOSURE (ENDOMECHANICALS) ×2 IMPLANT
DURAPREP 26ML APPLICATOR (WOUND CARE) ×2 IMPLANT
ELECT REM PT RETURN 9FT ADLT (ELECTROSURGICAL) ×2
ELECTRODE REM PT RTRN 9FT ADLT (ELECTROSURGICAL) ×1 IMPLANT
FILTER SMOKE EVAC LAPAROSHD (FILTER) ×2 IMPLANT
FORMALIN 10 PREFIL 120ML (MISCELLANEOUS) ×2 IMPLANT
GLOVE BIOGEL PI IND STRL 7.0 (GLOVE) ×3 IMPLANT
GLOVE BIOGEL PI IND STRL 7.5 (GLOVE) ×1 IMPLANT
GLOVE BIOGEL PI INDICATOR 7.0 (GLOVE) ×3
GLOVE BIOGEL PI INDICATOR 7.5 (GLOVE) ×1
GLOVE ECLIPSE 6.5 STRL STRAW (GLOVE) ×4 IMPLANT
GLOVE ECLIPSE 7.0 STRL STRAW (GLOVE) ×2 IMPLANT
GOWN STRL REIN XL XLG (GOWN DISPOSABLE) ×8 IMPLANT
HEMOSTAT SNOW SURGICEL 2X4 (HEMOSTASIS) IMPLANT
INST SET LAPROSCOPIC AP (KITS) ×2 IMPLANT
IV NS IRRIG 3000ML ARTHROMATIC (IV SOLUTION) ×2 IMPLANT
KIT ROOM TURNOVER APOR (KITS) ×2 IMPLANT
MANIFOLD NEPTUNE II (INSTRUMENTS) ×2 IMPLANT
NEEDLE INSUFFLATION 14GA 120MM (NEEDLE) ×2 IMPLANT
PACK LAP CHOLE LZT030E (CUSTOM PROCEDURE TRAY) ×4 IMPLANT
PAD ARMBOARD 7.5X6 YLW CONV (MISCELLANEOUS) ×2 IMPLANT
POUCH SPECIMEN RETRIEVAL 10MM (ENDOMECHANICALS) ×2 IMPLANT
SET BASIN LINEN APH (SET/KITS/TRAYS/PACK) ×2 IMPLANT
SET TUBE IRRIG SUCTION NO TIP (IRRIGATION / IRRIGATOR) ×2 IMPLANT
SLEEVE Z-THREAD 5X100MM (TROCAR) ×4 IMPLANT
STRIP CLOSURE SKIN 1/2X4 (GAUZE/BANDAGES/DRESSINGS) ×2 IMPLANT
SUT MNCRL AB 4-0 PS2 18 (SUTURE) ×4 IMPLANT
SUT VIC AB 2-0 CT2 27 (SUTURE) ×2 IMPLANT
TROCAR Z-THRD FIOS HNDL 11X100 (TROCAR) ×2 IMPLANT
TROCAR Z-THREAD FIOS 5X100MM (TROCAR) ×2 IMPLANT
WARMER LAPAROSCOPE (MISCELLANEOUS) ×2 IMPLANT

## 2012-07-28 NOTE — Anesthesia Preprocedure Evaluation (Addendum)
Anesthesia Evaluation  Patient identified by MRN, date of birth, ID band Patient awake    Reviewed: Allergy & Precautions, H&P , NPO status , Patient's Chart, lab work & pertinent test results  History of Anesthesia Complications Negative for: history of anesthetic complications  Airway Mallampati: II TM Distance: >3 FB     Dental  (+) Teeth Intact and Poor Dentition   Pulmonary neg pulmonary ROS,  breath sounds clear to auscultation        Cardiovascular negative cardio ROS  Rhythm:Regular Rate:Tachycardia     Neuro/Psych    GI/Hepatic GERD- (with nausea.)  ,  Endo/Other    Renal/GU      Musculoskeletal   Abdominal   Peds  Hematology   Anesthesia Other Findings Aborted pregnancy in progress, still with some vaginal bleeding/spotting.  Reproductive/Obstetrics                           Anesthesia Physical Anesthesia Plan  ASA: II  Anesthesia Plan: General   Post-op Pain Management:    Induction: Intravenous, Rapid sequence and Cricoid pressure planned  Airway Management Planned: Oral ETT  Additional Equipment:   Intra-op Plan:   Post-operative Plan: Extubation in OR  Informed Consent: I have reviewed the patients History and Physical, chart, labs and discussed the procedure including the risks, benefits and alternatives for the proposed anesthesia with the patient or authorized representative who has indicated his/her understanding and acceptance.     Plan Discussed with:   Anesthesia Plan Comments:         Anesthesia Quick Evaluation

## 2012-07-28 NOTE — Progress Notes (Signed)
No increase in red vag drainage on peri pad.

## 2012-07-28 NOTE — Transfer of Care (Signed)
Immediate Anesthesia Transfer of Care Note  Patient: Gwendolyn Glass  Procedure(s) Performed: Procedure(s) (LRB): LAPAROSCOPIC CHOLECYSTECTOMY (N/A)  Patient Location: PACU  Anesthesia Type: General  Level of Consciousness: awake, alert , oriented and patient cooperative  Airway & Oxygen Therapy: Patient Spontanous Breathing and Patient connected to face mask oxygen  Post-op Assessment: Report given to PACU RN and Post -op Vital signs reviewed and stable  Post vital signs: Reviewed and stable  Complications: No apparent anesthesia complications

## 2012-07-28 NOTE — Anesthesia Procedure Notes (Signed)
Procedure Name: Intubation Date/Time: 07/28/2012 12:32 PM Performed by: Carolyne Littles, AMY L Pre-anesthesia Checklist: Patient identified, Patient being monitored, Timeout performed, Emergency Drugs available and Suction available Patient Re-evaluated:Patient Re-evaluated prior to inductionOxygen Delivery Method: Circle System Utilized Preoxygenation: Pre-oxygenation with 100% oxygen Intubation Type: IV induction, Rapid sequence and Cricoid Pressure applied Laryngoscope Size: 3 and Miller Grade View: Grade I Tube type: Oral Tube size: 7.0 mm Number of attempts: 1 Airway Equipment and Method: stylet Placement Confirmation: ETT inserted through vocal cords under direct vision,  positive ETCO2 and breath sounds checked- equal and bilateral Secured at: 21 cm Tube secured with: Tape Dental Injury: Teeth and Oropharynx as per pre-operative assessment

## 2012-07-28 NOTE — Preoperative (Signed)
Beta Blockers   Reason not to administer Beta Blockers:Not Applicable 

## 2012-07-28 NOTE — Progress Notes (Signed)
Day of Surgery  Subjective: No significant change. Still with significant right upper quadrant bowel pain. No questions.  Objective: Vital signs in last 24 hours: Temp:  [97.9 F (36.6 C)-98.9 F (37.2 C)] 98.9 F (37.2 C) (08/27 1132) Pulse Rate:  [81-92] 92  (08/27 1132) Resp:  [9-24] 24  (08/27 1155) BP: (104-138)/(70-90) 126/84 mmHg (08/27 1155) SpO2:  [98 %-100 %] 100 % (08/27 1155) Last BM Date: 07/25/12  Intake/Output from previous day: 08/26 0701 - 08/27 0700 In: 480 [P.O.:480] Out: -  Intake/Output this shift:    General appearance: alert and no distress Resp: clear to auscultation bilaterally GI: Positive bowel sounds, soft, positive right upper quadrant dominant pain. Positive Murphy sign. No diffuse peritoneal signs.  Lab Results:   Basename 07/28/12 0535 07/27/12 0500  WBC 5.9 7.5  HGB 12.9 13.3  HCT 39.0 40.4  PLT 163 177   BMET  Basename 07/28/12 0535 07/27/12 0500  NA 141 139  K 3.8 3.8  CL 106 107  CO2 28 27  GLUCOSE 92 94  BUN 5* 6  CREATININE 0.73 0.74  CALCIUM 9.3 9.1   PT/INR No results found for this basename: LABPROT:2,INR:2 in the last 72 hours ABG No results found for this basename: PHART:2,PCO2:2,PO2:2,HCO3:2 in the last 72 hours  Studies/Results: Dg Chest 2 View  07/26/2012  *RADIOLOGY REPORT*  Clinical Data: Chest pain and tachycardia.  CHEST - 2 VIEW  Comparison: 07/20/2012.  Findings: The cardiac silhouette, mediastinal and hilar contours are within normal limits and stable. The lungs are clear.  No pleural effusions.  The bony thorax is intact.  IMPRESSION: Normal chest x-ray.   Original Report Authenticated By: P. Loralie Champagne, M.D.     Anti-infectives: Anti-infectives     Start     Dose/Rate Route Frequency Ordered Stop   07/27/12 1130   ceFAZolin (ANCEF) IVPB 2 g/50 mL premix        2 g 100 mL/hr over 30 Minutes Intravenous 60 min pre-op 07/27/12 1130            Assessment/Plan: s/p Procedure(s)  (LRB): LAPAROSCOPIC CHOLECYSTECTOMY (N/A) Acute cholecystitis. Again discussed risks benefits alternatives. We'll plan to proceed to the operating room for planned laparoscopic possible open cholecystectomy as previously discussed with patient. Her questions and concerns were addressed.  LOS: 2 days    Blen Ransome C 07/28/2012

## 2012-07-28 NOTE — Progress Notes (Signed)
Small amt red vag drainage on peri pad.

## 2012-07-28 NOTE — Anesthesia Postprocedure Evaluation (Signed)
  Anesthesia Post-op Note  Patient: Gwendolyn Glass  Procedure(s) Performed: Procedure(s) (LRB): LAPAROSCOPIC CHOLECYSTECTOMY (N/A)  Patient Location: PACU  Anesthesia Type: General  Level of Consciousness: awake, alert , oriented and patient cooperative  Airway and Oxygen Therapy: Patient Spontanous Breathing and Patient connected to face mask oxygen  Post-op Pain: moderate  Post-op Assessment: Post-op Vital signs reviewed, Patient's Cardiovascular Status Stable, Respiratory Function Stable, Patent Airway and No signs of Nausea or vomiting  Post-op Vital Signs: Reviewed and stable  Complications: No apparent anesthesia complications

## 2012-07-28 NOTE — Op Note (Signed)
Patient:  Gwendolyn Glass  DOB:  1981/04/18  MRN:  [id number]   Preop Diagnosis:  Acute cholecystitis  Postop Diagnosis:  Same  Procedure:  Laparoscopic cholecystectomy  Surgeon:  Dr. Tilford Pillar  Anes:  General endotracheal, 0.5% Sensorcaine plain for local  Indications:  Patient is a 31 year old female presented to [facility] with right upper quadrant abdominal pain. Workup and evaluation was consistent for acute cholecystitis. Risks benefits alternatives a laparoscopic possible open cholecystectomy were discussed at length patient including but not limited to risk of bleeding, infection, bile leak, small bowel injury, common bladder injury, intraoperative cardiac and pulmonary events. Patient's questions and concerns are addressed the patient was consented for planned procedure.  Procedure note:  Patient was taken to the operator is placed in supine position the or table time the general anesthetic is a Optician, dispensing. Once patient was asleep she was endotracheally intubated by the nurse anesthetist. At this point her abdomen is prepped with DuraPrep solution and draped in standard fashion. Stab incision was created at the umbilical 11 blade scalpel with additional dissection down to subcuticular tissue carried out using a Coker clamp which he utilized grasp the anterior normal fascia and lift his anteriorly. A Veress needle is inserted saline drop test is utilized confirm intraperitoneal placement and then pneumoperitoneum was initiated. Once sufficient pneumoperitoneum was obtained an 11 mm trochars inserted over laparoscopic allowing visualization the trocar entering into the peritoneal cavity. At this point the inner cannula was removed the laparoscope was reinserted there is no evidence of any trocar or Veress needle placement injury. At this time the remaining trochars replaced with a 5 mm can epigastrium, 5 mm in the midline, and a 5 mm in the right lateral abdominal wall. Patient's  placed into a reverse Trendelenburg left lateral decubitus position. The fundus of the dollars grasped and lifted up and over the right lobe liver. Some omental adhesions to the gallbladder body or bulge stripped using a Art gallery manager. The peritoneal flexion onto the infundibulum was bluntly stripped using a [place] dissector to expose both the cystic duct and cystic artery is entered into the infundibulum. A window was created by both the structures. 3 clips placed proximally one distally and the cystic duct and the cystic duct was divided between 2 most clips. 2 endoclips placed proximally one distally and the cystic artery and cystic arteries divided between 2 most distal clips. At this point a letter cautery utilized dissect the collar free from the gallbladder fossa. During this dissection a small cholecystotomy was created with some bile spillage which was quickly controlled. Once gallbladder was free it was placed into an Endo Catch bag and placed up and over the right lobe the liver. This was facilitated by exchanging the 10 mm scope for a 5 mm scope. At this point did copiously irrigate the surgical field with saline using a suction irrigator. Returning aspirate was clear. Inspection the gallbladder fossa indicate excellent hemostasis having been attainable after cautery. The endoclips were inspected there is no evidence of any bleeding or bile leak. At this time her my attention to closure.  Using an Endo Close suture passing device a 2-0 Vicryl suture was passed to the umbilical trocar site. With this suture and placed the gallbladder was retrieved was removed through the endoclips trocar site and intact Endo Catch bag. It was placed in the back table and sent as a perm specimen to pathology. At this time the pneumoperitoneum was evacuated. Trochars were removed. The Vicryl  suture was secured. Local anesthetic is instilled. A 4-0 Monocryl utilized reapproximate the skin edges at all 4 trocar  sites. The skin was washed dried moist dry towel. Benzoin is applied around incision. Half-inch are suture placed. The drapes removed patient left come out of general anesthetic and stretcher back in stable condition. At the conclusion of procedure all instrument, sponge, needle counts are correct. Patient tolerated procedure extremely well.  Complications:   None  EBL:  Minimal  Specimen:  Gallbladder

## 2012-07-29 MED ORDER — MIDAZOLAM HCL 2 MG/2ML IJ SOLN
INTRAMUSCULAR | Status: AC
  Filled 2012-07-29: qty 2

## 2012-07-29 MED ORDER — ONDANSETRON HCL 4 MG/2ML IJ SOLN
INTRAMUSCULAR | Status: AC
  Filled 2012-07-29: qty 2

## 2012-07-29 MED ORDER — CHLORHEXIDINE GLUCONATE CLOTH 2 % EX PADS
6.0000 | MEDICATED_PAD | Freq: Every day | CUTANEOUS | Status: DC
  Administered 2012-07-29: 6 via TOPICAL

## 2012-07-29 MED ORDER — FENTANYL CITRATE 0.05 MG/ML IJ SOLN
INTRAMUSCULAR | Status: AC
  Filled 2012-07-29: qty 2

## 2012-07-29 MED ORDER — MUPIROCIN 2 % EX OINT
1.0000 "application " | TOPICAL_OINTMENT | Freq: Two times a day (BID) | CUTANEOUS | Status: DC
  Administered 2012-07-29: 1 via NASAL

## 2012-07-29 MED ORDER — OXYCODONE-ACETAMINOPHEN 5-325 MG PO TABS
1.0000 | ORAL_TABLET | ORAL | Status: DC | PRN
  Administered 2012-07-29 (×2): 2 via ORAL
  Filled 2012-07-29 (×2): qty 2

## 2012-07-29 NOTE — Addendum Note (Signed)
Addendum  created 07/29/12 0909 by Marolyn Hammock, CRNA   Modules edited:Notes Section

## 2012-07-29 NOTE — Anesthesia Postprocedure Evaluation (Signed)
  Anesthesia Post-op Note  Patient: Gwendolyn Glass  Procedure(s) Performed: Procedure(s) (LRB): LAPAROSCOPIC CHOLECYSTECTOMY (N/A)  Patient Location:Room 324  Anesthesia Type: General  Level of Consciousness: awake, alert , oriented and patient cooperative  Airway and Oxygen Therapy: Patient Spontanous Breathing  Post-op Pain: mild  Post-op Assessment: Post-op Vital signs reviewed, Patient's Cardiovascular Status Stable, Respiratory Function Stable, Patent Airway and No signs of Nausea or vomiting  Post-op Vital Signs: Reviewed and stable  Complications: No apparent anesthesia complications

## 2012-07-29 NOTE — Progress Notes (Signed)
1 Day Post-Op  Subjective: Pain poorly controlled. Tolerating diet. Some ambulation  Objective: Vital signs in last 24 hours: Temp:  [97.7 F (36.5 C)-98.6 F (37 C)] 98.4 F (36.9 C) (08/28 1008) Pulse Rate:  [73-105] 89  (08/28 1008) Resp:  [12-24] 18  (08/28 1008) BP: (107-144)/(70-85) 122/72 mmHg (08/28 1008) SpO2:  [93 %-100 %] 97 % (08/28 1008) Weight:  [103.2 kg (227 lb 8.2 oz)] 103.2 kg (227 lb 8.2 oz) (08/28 0520) Last BM Date: 07/25/12  Intake/Output from previous day: 08/27 0701 - 08/28 0700 In: 4753.9 [P.O.:240; I.V.:4513.9] Out: 5 [Blood:5] Intake/Output this shift: Total I/O In: 480 [P.O.:480] Out: -   General appearance: alert and no distress Resp: clear to auscultation bilaterally Cardio: regular rate and rhythm GI: Positive bowel sounds, soft, moderate expected postoperative tenderness. Incisions are clean dry and intact. No peritoneal signs  Lab Results:   Basename 07/28/12 0535 07/27/12 0500  WBC 5.9 7.5  HGB 12.9 13.3  HCT 39.0 40.4  PLT 163 177   BMET  Basename 07/28/12 0535 07/27/12 0500  NA 141 139  K 3.8 3.8  CL 106 107  CO2 28 27  GLUCOSE 92 94  BUN 5* 6  CREATININE 0.73 0.74  CALCIUM 9.3 9.1   PT/INR No results found for this basename: LABPROT:2,INR:2 in the last 72 hours ABG No results found for this basename: PHART:2,PCO2:2,PO2:2,HCO3:2 in the last 72 hours  Studies/Results: No results found.  Anti-infectives: Anti-infectives     Start     Dose/Rate Route Frequency Ordered Stop   07/27/12 1130   ceFAZolin (ANCEF) IVPB 2 g/50 mL premix  Status:  Discontinued        2 g 100 mL/hr over 30 Minutes Intravenous 60 min pre-op 07/27/12 1130 07/28/12 1847          Assessment/Plan: s/p Procedure(s) (LRB): LAPAROSCOPIC CHOLECYSTECTOMY (N/A) Overall patient is doing well. Continued activity as tolerated. I will switch the patient Percocet to see if she has better coverage. Potentially if patient continues to demonstrate  improvement discharge may be considered later this afternoon  LOS: 3 days    Myking Sar C 07/29/2012

## 2012-07-29 NOTE — Progress Notes (Signed)
Pt discharging to home. Went over Pt discharge instructions. Pt was able to verbalize instructions back to me after education was complete. Pt understood instructions and did not have any further questions. Gwendolyn Glass D  07/29/2012

## 2012-07-30 ENCOUNTER — Emergency Department (HOSPITAL_COMMUNITY)
Admission: EM | Admit: 2012-07-30 | Discharge: 2012-07-30 | Disposition: A | Discharge status: Home / Self Care | Payer: BC Managed Care – PPO | Attending: Emergency Medicine | Admitting: Emergency Medicine

## 2012-07-30 ENCOUNTER — Encounter (HOSPITAL_COMMUNITY): Payer: Self-pay | Admitting: Emergency Medicine

## 2012-07-30 DIAGNOSIS — Z139 Encounter for screening, unspecified: Secondary | ICD-10-CM

## 2012-07-30 DIAGNOSIS — Z9889 Other specified postprocedural states: Secondary | ICD-10-CM

## 2012-07-30 DIAGNOSIS — Z4801 Encounter for change or removal of surgical wound dressing: Secondary | ICD-10-CM | POA: Insufficient documentation

## 2012-07-30 NOTE — ED Provider Notes (Signed)
History     CSN: [id number]  Arrival date & time 07/30/12  1221   First MD Initiated Contact with Patient 07/30/12 1312      Chief Complaint  Patient presents with  . Post-op Problem     HPI Pt was seen at 1325.  Per pt, c/o gradual onset and persistence of constant request for post-op check today.  Pt is s/p laparoscopic cholecystectomy 2 days ago.  States today she noticed the top incision steri-strips were "stuck" to her shirt; when she bent over to look at this area, a small amount of "clear fluid" drained from the bottom of the steri-strip area and she noticed "3 small worms" just proximal to the top of her steri-strips.  Pt states the other incisions are "fine."  Pt states she has not showered yet since the operation 2 days ago.  Denies abd pain, no N/V/D, no back pain, no fevers, no rash.    Past Medical History  Diagnosis Date  . Pregnant   . Miscarriage     pt. states shes  spotting  and wearing a tampon    Past Surgical History  Procedure Date  . Appendectomy   . Cesarean section   . Cholecystectomy     History  Substance Use Topics  . Smoking status: Never Smoker   . Smokeless tobacco: Not on file  . Alcohol Use: Yes    Review of Systems ROS: Statement: All systems negative except as marked or noted in the HPI; Constitutional: Negative for fever and chills. ; ; Eyes: Negative for eye pain, redness and discharge. ; ; ENMT: Negative for ear pain, hoarseness, nasal congestion, sinus pressure and sore throat. ; ; Cardiovascular: Negative for chest pain, palpitations, diaphoresis, dyspnea and peripheral edema. ; ; Respiratory: Negative for cough, wheezing and stridor. ; ; Gastrointestinal: Negative for nausea, vomiting, diarrhea, abdominal pain, blood in stool, hematemesis, jaundice and rectal bleeding. . ; ; Genitourinary: Negative for dysuria, flank pain and hematuria. ; ; Musculoskeletal: Negative for back pain and neck pain. Negative for swelling and trauma.; ; Skin:  Negative for pruritus, rash, abrasions, blisters, bruising and skin lesion.; ; Neuro: Negative for headache, lightheadedness and neck stiffness. Negative for weakness, altered level of consciousness , altered mental status, extremity weakness, paresthesias, involuntary movement, seizure and syncope.     Allergies  Cinnamon and Bee venom  Home Medications   Current Outpatient Rx  Name Route Sig Dispense Refill  . HYDROCODONE-ACETAMINOPHEN 5-325 MG PO TABS Oral Take 1 tablet by mouth every 6 (six) hours as needed. pain    . ONE-DAILY MULTI VITAMINS PO TABS Oral Take 1 tablet by mouth daily.      BP 122/81  Pulse 86  Temp 98.4 F (36.9 C) (Oral)  Resp 18  Ht 5\' 8"  (1.727 m)  Wt 200 lb (90.719 kg)  BMI 30.41 kg/m2  SpO2 100%  LMP 06/19/2012  Physical Exam 1330: Physical examination:  Nursing notes reviewed; Vital signs and O2 SAT reviewed;  Constitutional: Well developed, Well nourished, Well hydrated, In no acute distress; Head:  Normocephalic, atraumatic; Eyes: EOMI, PERRL, No scleral icterus; ENMT: Mouth and pharynx normal, Mucous membranes moist; Neck: Supple, Full range of motion, No lymphadenopathy; Cardiovascular: Regular rate and rhythm, No gallop; Respiratory: Breath sounds clear & equal bilaterally, No wheezes.  Speaking full sentences with ease, Normal respiratory effort/excursion; Chest: Nontender, Movement normal; Abdomen: Soft, Nontender, Nondistended, Normal bowel sounds; Extremities: Pulses normal, No tenderness, No edema, No calf edema or asymmetry.;  Neuro: AA&Ox3, Major CN grossly intact.  Speech clear. No gross focal motor or sensory deficits in extremities.; Skin: Color normal, Warm, Dry, +multiple healing surgical wounds on abd wall.  No drainage, no erythema, no edema, no ecchymosis.   ED Course  Procedures   MDM  MDM Reviewed: nursing note, vitals and previous chart    1345:  Pt has 2-3 very small thin hair-like black worms in a baggie.  Steri-strips in "a  cross" configuration removed from approx 1cm horizontal linear surgical wound at proximal abd wall; they have dried serosanguinous drainage on them, no purulence, no "worms."  Wound edges are well approximated. This top abd wound is not actively draining fluid and drainage cannot be manually expressed.  There is no surrounding erythema, edema, or fluctuance.  No evidence of "worms" in or around wound site.  T/C to General Surgeon Dr. Leticia Penna, case discussed, including:  HPI, pertinent PM/SHx, VS/PE, dx testing, ED course and treatment:  Agrees that it does not sound like "worms" were in the incision, requests not to start abx for now since no hard signs of infection, aware I removed the steri-strips from the upper abd wound site and it is OK to not replace them as wound is not likely to dehisce at this point in healing, have pt bathe today, f/u in ofc.  Dx, as well as d/w Dr. Leticia Penna, d/w pt and family.  Questions answered.  Verb understanding, agreeable to d/c home with outpt f/u.           Laray Anger, DO 08/01/12 1031

## 2012-07-30 NOTE — ED Notes (Signed)
Discharge home instructions reviewed.

## 2012-07-30 NOTE — ED Notes (Signed)
Pt states she had drainage in the top incision and then noticed 3 worms around the incision. Pt states she came home from the hospital yesterday from gallbladder surgery. Pt has the worms in a ziplock bag.

## 2012-07-31 ENCOUNTER — Encounter (HOSPITAL_COMMUNITY): Payer: Self-pay | Admitting: General Surgery

## 2012-07-31 NOTE — Progress Notes (Signed)
UR Chart Review Completed  

## 2012-08-05 NOTE — Discharge Summary (Signed)
Physician Discharge Summary  Patient ID: Gwendolyn Glass MRN: [id number] DOB/AGE: 1981/02/20 31 y.o.  Admit date: 07/26/2012 Discharge date: 07/29/2012  Admission Diagnoses: Acute cholecystitis  Discharge Diagnoses: Same Active Problems:  * No active hospital problems. *    Discharged Condition: stable  Hospital Course: Patient presented to [facility] with epigastric abdominal pain. Workup and evaluation was consistent for acute cholecystitis. She was taken to the operating room for a laparoscopic cholecystectomy. She tolerated this well. Was advanced on her diet. Patient's [place]. Patient's pain is controlled oral analgesia. Patient was made ready for discharge to home.  Consults: None  Significant Diagnostic Studies: labs: CBC,bmet  Treatments: surgery: Laparoscopic cholecystectomy  Discharge Exam: Blood pressure 110/71, pulse 88, temperature 98 F (36.7 C), temperature source Oral, resp. rate 18, height 5\' 8"  (1.727 m), weight 103.2 kg (227 lb 8.2 oz), last menstrual period 06/19/2012, SpO2 97.00%. General appearance: alert and no distress Resp: clear to auscultation bilaterally Cardio: regular rate and rhythm GI: Positive bowel sounds, soft, flat, expected postoperative tenderness. No masses or hernias.  Disposition: 01-Home or Self Care  Discharge Orders    Future Orders Please Complete By Expires   Diet - low sodium heart healthy      Increase activity slowly      Discharge instructions      Comments:   Increase activity as tolerated. May place ice pack for comfort.  Alternate an anti-inflammatory such as ibuprofen (Motrin, Advil) 400-600mg  every 6 hours with the prescribed pain medication.   Do not take any additional acetaminophen as there is Tylenol in the pain medication.   Driving Restrictions      Comments:   No driving while on pain medications.   Lifting restrictions      Comments:   No lifting over 20lbs for 4-5 weeks post-op.   Discharge wound  care:      Comments:   Clean surgical sites with soap and water.  May shower the morning after surgery unless instructed by Dr. Leticia Penna otherwise.  No soaking for 2-3 weeks.    If adhesive strips are in place, they may be removed in 1-2 weeks while in the shower.   Call MD for:  temperature >100.4      Call MD for:  severe uncontrolled pain      Call MD for:  persistant nausea and vomiting      Call MD for:  redness, tenderness, or signs of infection (pain, swelling, redness, odor or green/yellow discharge around incision site)        Medication List  As of 08/05/2012  9:30 AM   STOP taking these medications         acetaminophen 500 MG tablet         TAKE these medications         HYDROcodone-acetaminophen 5-325 MG per tablet   Commonly known as: NORCO/VICODIN   Take 1 tablet by mouth every 6 (six) hours as needed. pain      multivitamin tablet   Take 1 tablet by mouth daily.             Signed: Courtenay Hirth C 08/05/2012, 9:30 AM

## 2012-09-07 DIAGNOSIS — Z01810 Encounter for preprocedural cardiovascular examination: Secondary | ICD-10-CM

## 2012-09-09 ENCOUNTER — Observation Stay (HOSPITAL_COMMUNITY)
Admission: EM | Admit: 2012-09-09 | Discharge: 2012-09-10 | Disposition: A | Discharge status: Home / Self Care | Payer: BC Managed Care – PPO | Attending: Obstetrics and Gynecology | Admitting: Obstetrics and Gynecology

## 2012-09-09 ENCOUNTER — Other Ambulatory Visit: Payer: Self-pay | Admitting: Obstetrics and Gynecology

## 2012-09-09 ENCOUNTER — Encounter (HOSPITAL_COMMUNITY): Payer: Self-pay

## 2012-09-09 ENCOUNTER — Emergency Department (HOSPITAL_COMMUNITY): Payer: BC Managed Care – PPO

## 2012-09-09 DIAGNOSIS — O009 Unspecified ectopic pregnancy without intrauterine pregnancy: Secondary | ICD-10-CM

## 2012-09-09 DIAGNOSIS — O00109 Unspecified tubal pregnancy without intrauterine pregnancy: Principal | ICD-10-CM | POA: Diagnosis present

## 2012-09-09 LAB — URINE MICROSCOPIC-ADD ON

## 2012-09-09 LAB — URINALYSIS, ROUTINE W REFLEX MICROSCOPIC
Nitrite: POSITIVE — AB
Protein, ur: 100 mg/dL — AB
Urobilinogen, UA: 1 mg/dL (ref 0.0–1.0)

## 2012-09-09 LAB — TYPE AND SCREEN: ABO/RH(D): O POS

## 2012-09-09 LAB — CBC WITH DIFFERENTIAL/PLATELET
Basophils Absolute: 0 10*3/uL (ref 0.0–0.1)
Lymphocytes Relative: 17 % (ref 12–46)
Neutro Abs: 8.2 10*3/uL — ABNORMAL HIGH (ref 1.7–7.7)
Platelets: 187 10*3/uL (ref 150–400)
RDW: 14.1 % (ref 11.5–15.5)
WBC: 10.8 10*3/uL — ABNORMAL HIGH (ref 4.0–10.5)

## 2012-09-09 LAB — HCG, QUANTITATIVE, PREGNANCY: hCG, Beta Chain, Quant, S: 1193 m[IU]/mL — ABNORMAL HIGH (ref <5)

## 2012-09-09 LAB — BASIC METABOLIC PANEL
CO2: 22 mEq/L (ref 19–32)
Calcium: 9.3 mg/dL (ref 8.4–10.5)
Chloride: 106 mEq/L (ref 96–112)
Sodium: 137 mEq/L (ref 135–145)

## 2012-09-09 LAB — POCT PREGNANCY, URINE: Preg Test, Ur: POSITIVE — AB

## 2012-09-09 MED ORDER — SODIUM CHLORIDE 0.9 % IV SOLN: Freq: Once | INTRAVENOUS | Status: DC

## 2012-09-09 MED ORDER — HYDROCODONE-ACETAMINOPHEN 5-325 MG PO TABS
1.0000 | ORAL_TABLET | ORAL | Status: DC | PRN
  Filled 2012-09-09: qty 2

## 2012-09-09 MED ORDER — ONDANSETRON HCL 4 MG/2ML IJ SOLN
4.0000 mg | Freq: Once | INTRAMUSCULAR | Status: AC
  Administered 2012-09-09: 4 mg via INTRAVENOUS
  Filled 2012-09-09: qty 2

## 2012-09-09 MED ORDER — DEXTROSE 5 % IV SOLN
1.0000 g | Freq: Once | INTRAVENOUS | Status: AC
  Administered 2012-09-09: 1 g via INTRAVENOUS
  Filled 2012-09-09: qty 10

## 2012-09-09 MED ORDER — ONDANSETRON 8 MG PO TBDP: 8.0000 mg | ORAL_TABLET | Freq: Once | ORAL | Status: DC

## 2012-09-09 MED ORDER — MORPHINE SULFATE 2 MG/ML IJ SOLN
2.0000 mg | Freq: Once | INTRAMUSCULAR | Status: AC
  Administered 2012-09-09: 2 mg via INTRAVENOUS
  Filled 2012-09-09: qty 1

## 2012-09-09 MED ORDER — PRENATAL PLUS 27-1 MG PO TABS
1.0000 | ORAL_TABLET | Freq: Every day | ORAL | Status: DC
  Filled 2012-09-09 (×4): qty 1

## 2012-09-09 MED ORDER — SODIUM CHLORIDE 0.9 % IV SOLN
Freq: Once | INTRAVENOUS | Status: AC
  Administered 2012-09-10: via INTRAVENOUS

## 2012-09-09 MED ORDER — HYDROMORPHONE HCL PF 1 MG/ML IJ SOLN
1.0000 mg | INTRAMUSCULAR | Status: DC | PRN
  Administered 2012-09-09 – 2012-09-10 (×3): 1 mg via INTRAVENOUS
  Filled 2012-09-09 (×4): qty 1

## 2012-09-09 MED ORDER — SODIUM CHLORIDE 0.9 % IV BOLUS (SEPSIS)
500.0000 mL | Freq: Once | INTRAVENOUS | Status: AC
  Administered 2012-09-09: 500 mL via INTRAVENOUS

## 2012-09-09 MED ORDER — MORPHINE SULFATE 4 MG/ML IJ SOLN
4.0000 mg | Freq: Once | INTRAMUSCULAR | Status: AC
  Administered 2012-09-09: 4 mg via INTRAVENOUS
  Filled 2012-09-09: qty 1

## 2012-09-09 NOTE — ED Provider Notes (Signed)
History    This chart was scribed for Joya Gaskins, MD by Albertha Ghee Rifaie. This patient was seen in room APA01/AP[facility]01 and the patient's care was started at 6:31 PM. CSN: [id number]  Arrival date & time 09/09/12  1811   None     Chief Complaint  Patient presents with  . Abdominal Pain  . Morning Sickness  . Emesis During Pregnancy    The history is provided by the patient. No language interpreter was used.   Gwendolyn Glass is a 31 y.o. female who presents to the Emergency Department complaining of 2 hrs of gradually worsening, gradual onset, constat LLQ abd pain described as sharp that radiates to her back. Pt reports vaginal bleeding. she denies fever, chills, and CP. Pt reports being 3-[redacted] weeks pregnant that is associated with nausea. She also reports having a h/o miscarriage. Pt denies smoking and alcohol use. She denies h/o ectopic No dysuria is reported   Past Medical History  Diagnosis Date  . Pregnant   . Miscarriage     pt. states shes  spotting  and wearing a tampon    Past Surgical History  Procedure Date  . Appendectomy   . Cesarean section   . Cholecystectomy   . Cholecystectomy 07/28/2012    Procedure: LAPAROSCOPIC CHOLECYSTECTOMY;  Surgeon: Fabio Bering, MD;  Location: [facility];  Service: General;  Laterality: N/A;    No family history on file.  History  Substance Use Topics  . Smoking status: Never Smoker   . Smokeless tobacco: Not on file  . Alcohol Use: No    No OB history provided  Review of Systems  Constitutional: Negative for fever.  Genitourinary: Positive for vaginal bleeding.  All other systems reviewed and are negative.    Allergies  Cinnamon; Bee venom; and Toradol  Home Medications   Current Outpatient Rx  Name Route Sig Dispense Refill  . HYDROCODONE-ACETAMINOPHEN 5-325 MG PO TABS Oral Take 1 tablet by mouth every 6 (six) hours as needed. pain    . ONE-DAILY MULTI VITAMINS PO TABS Oral Take 1 tablet by mouth daily.        BP 122/75  Pulse 119  Temp 98.3 F (36.8 C) (Oral)  Resp 20  Ht 5\' 8"  (1.727 m)  Wt 200 lb (90.719 kg)  BMI 30.41 kg/m2  SpO2 100%  LMP 06/17/2012  Physical Exam  CONSTITUTIONAL: Well developed/well nourished HEAD AND FACE: Normocephalic/atraumatic EYES: EOMI/PERRL ENMT: Mucous membranes moist NECK: supple no meningeal signs SPINE:entire spine nontender CV: S1/S2 noted, no murmurs/rubs/gallops noted LUNGS: Lungs are clear to auscultation bilaterally, no apparent distress ABDOMEN: soft, LLQ tenderness, no rebound or guarding GU:no cva tenderness Os closed.  Blood noted in vaginal vault.  No products of conception.  Chaperone present.  Mild left adnexal tenderness noted no mass noted.  No cmt NEURO: Pt is awake/alert, moves all extremitiesx4 EXTREMITIES: pulses normal, full ROM SKIN: warm, color normal PSYCH: no abnormalities of mood noted  ED Course  Procedures DIAGNOSTIC STUDIES: Oxygen Saturation is 100% on room air, normal by my interpretation.    COORDINATION OF CARE:  6:52 PM Discussed treatment plan with pt at bedside and pt agreed to plan. Pt kept NPO.  She is pregnant.  1st trimester [place] ordered to evaluate for IUP IV fluids given and also IV pain meds due to severe pain Abdomen soft, does not have surgical abdomen at this time  9:31 PM [place] results noted D/w dr Emelda Fear with gyn, will see  patient in ED.  She may have early ectopic pregnancy 10:22 PM Dr Emelda Fear to admit for OR Pt stabilized in the ED   MDM  Nursing notes including past medical history and social history reviewed and considered in documentation Labs/vital reviewed and considered       I personally performed the services described in this documentation, which was scribed in my presence. The recorded information has been reviewed and considered.      Joya Gaskins, MD 09/09/12 2222

## 2012-09-09 NOTE — ED Notes (Signed)
Gravida 5 Para 2 Abortus 2

## 2012-09-09 NOTE — ED Notes (Signed)
Pt reports being 3-[redacted] weeks pregnant.  Pt reports getting a sharp pain to her lower left abd today that radiates to her back.  Pt reports n/v and some vaginal bleeding.  Pt reports a hx of miscarriage.

## 2012-09-09 NOTE — ED Notes (Signed)
Patient states that the MD told her they seen something on ultrasound in her tube, there was nothing in her uterus.

## 2012-09-09 NOTE — H&P (Signed)
Gwendolyn Glass is an 31 y.o. femalewho presents to [facility] for evaluation to rule out ectopic, after being followed since last week at [facility] [place], with slowly progressing QHCG from first value in 100's to 300 to 700(monday), now at 1100+ with ultrasound not able to identify an intrauterine pregnancy, and non-diagnostic abnormalities noted in left adnexa separate from the ovary.No cul de sac fluid.  Patient has had progressive significant increase in LLQ pain today, and distinct differences in the pain, compared to prior miscarriages.  Treatment options reviewed in detail with the patient and her husband/partner, and she declines consideration of following up Friday for serial qhcg and ultrasound, based on the degree of discomfort. She prefers to proceed to surgical evaluation, so is observed overnight, with plans for diagnostic laparoscopy, possible left salpingectomy, possible left salpingostomy tomorrow. Pt is aware that absolute certainty of location of pregnancy cannot be determined at this time, and in the unlikely chance of an intrauterine pregnancy, the effects of Anesthesia and surgery on intrauterine pregnancy are uncertain. Pertinent Gynecological History: Menses: flow is light Bleeding: light spotting x 2 days Contraception: none DES exposure: unknown Blood transfusions: none Sexually transmitted diseases: no past history she does report that prior ObGyn suggested that she had left sided adhesions and scar tissue. Previous GYN Procedures: DNC  Last mammogram:  Date:  Last pap:  Date:  OB History: G5, P2022   Menstrual History: Menarche age:  Patient's last menstrual period was 06/17/2012.    Past Medical History  Diagnosis Date  . Pregnant   . Miscarriage     pt. states shes  spotting  and wearing a tampon    Past Surgical History  Procedure Date  . Appendectomy   . Cesarean section   . Cholecystectomy   . Cholecystectomy 07/28/2012    Procedure:  LAPAROSCOPIC CHOLECYSTECTOMY;  Surgeon: Fabio Bering, MD;  Location: [facility];  Service: General;  Laterality: N/A;    No family history on file.  Social History:  reports that she has never smoked. She does not have any smokeless tobacco history on file. She reports that she does not drink alcohol or use illicit drugs.  Allergies:  Allergies  Allergen Reactions  . Cinnamon Swelling and Other (See Comments)    REACTION: throat swells  . Bee Venom Swelling and Other (See Comments)    REACTION: All over body swelling  . Toradol (Ketorolac Tromethamine)     Hives, and chest pain     (Not in a hospital admission)  Review of Systems  Gastrointestinal: Positive for abdominal pain.       Llq abd pain, worsened with activity, walking   Genitourinary: Negative for dysuria.       Light spotting x 2 days with progressive LLQ pain  Psychiatric/Behavioral: Positive for substance abuse. Negative for depression.    Blood pressure 121/60, pulse 106, temperature 98.3 F (36.8 C), temperature source Oral, resp. rate 20, height 5\' 8"  (1.727 m), weight 90.719 kg (200 lb), last menstrual period 06/17/2012, SpO2 100.00%. Physical Exam  Constitutional: She is oriented to person, place, and time. She appears well-developed and well-nourished.  HENT:  Head: Normocephalic.  Eyes: Pupils are equal, round, and reactive to light.  Neck: Normal range of motion. Neck supple.  Cardiovascular: Normal rate and regular rhythm.   Respiratory: Effort normal.  GI: Soft. Bowel sounds are normal. There is tenderness.       On the left  Neurological: She is alert and  oriented to person, place, and time.  Psychiatric: She has a normal mood and affect. Her behavior is normal. Judgment and thought content normal.    Results for orders placed during the hospital encounter of 09/09/12 (from the past 24 hour(s))  CBC WITH DIFFERENTIAL     Status: Abnormal   Collection Time   09/09/12  6:55 PM      Component Value  Range   WBC 10.8 (*) 4.0 - 10.5 K/uL   RBC 4.85  3.87 - 5.11 MIL/uL   Hemoglobin 13.8  12.0 - 15.0 g/dL   HCT 40.9  81.1 - 91.4 %   MCV 83.5  78.0 - 100.0 fL   MCH 28.5  26.0 - 34.0 pg   MCHC 34.1  30.0 - 36.0 g/dL   RDW 78.2  95.6 - 21.3 %   Platelets 187  150 - 400 K/uL   Neutrophils Relative 76  43 - 77 %   Neutro Abs 8.2 (*) 1.7 - 7.7 K/uL   Lymphocytes Relative 17  12 - 46 %   Lymphs Abs 1.9  0.7 - 4.0 K/uL   Monocytes Relative 6  3 - 12 %   Monocytes Absolute 0.6  0.1 - 1.0 K/uL   Eosinophils Relative 1  0 - 5 %   Eosinophils Absolute 0.1  0.0 - 0.7 K/uL   Basophils Relative 0  0 - 1 %   Basophils Absolute 0.0  0.0 - 0.1 K/uL  BASIC METABOLIC PANEL     Status: Abnormal   Collection Time   09/09/12  6:55 PM      Component Value Range   Sodium 137  135 - 145 mEq/L   Potassium 3.9  3.5 - 5.1 mEq/L   Chloride 106  96 - 112 mEq/L   CO2 22  19 - 32 mEq/L   Glucose, Bld 104 (*) 70 - 99 mg/dL   BUN 7  6 - 23 mg/dL   Creatinine, Ser 0.86  0.50 - 1.10 mg/dL   Calcium 9.3  8.4 - 57.8 mg/dL   GFR calc non Af Amer >90  >90 mL/min   GFR calc Af Amer >90  >90 mL/min  URINALYSIS, ROUTINE W REFLEX MICROSCOPIC     Status: Abnormal   Collection Time   09/09/12  7:03 PM      Component Value Range   Color, Urine AMBER (*) YELLOW   APPearance CLEAR  CLEAR   Specific Gravity, Urine >1.030 (*) 1.005 - 1.030   pH 6.5  5.0 - 8.0   Glucose, UA NEGATIVE  NEGATIVE mg/dL   Hgb urine dipstick LARGE (*) NEGATIVE   Bilirubin Urine SMALL (*) NEGATIVE   Ketones, ur TRACE (*) NEGATIVE mg/dL   Protein, ur 469 (*) NEGATIVE mg/dL   Urobilinogen, UA 1.0  0.0 - 1.0 mg/dL   Nitrite POSITIVE (*) NEGATIVE   Leukocytes, UA TRACE (*) NEGATIVE  URINE MICROSCOPIC-ADD ON     Status: Abnormal   Collection Time   09/09/12  7:03 PM      Component Value Range   Squamous Epithelial / LPF RARE  RARE   WBC, UA 3-6  <3 WBC/hpf   RBC / HPF TOO NUMEROUS TO COUNT  <3 RBC/hpf   Bacteria, UA MANY (*) RARE  POCT  PREGNANCY, URINE     Status: Abnormal   Collection Time   09/09/12  7:06 PM      Component Value Range   Preg Test, Ur POSITIVE (*) NEGATIVE  HCG, QUANTITATIVE, PREGNANCY     Status: Abnormal   Collection Time   09/09/12  7:13 PM      Component Value Range   hCG, Beta Chain, Quant, S 1193 (*) <5 mIU/mL  TYPE AND SCREEN     Status: Normal   Collection Time   09/09/12  7:14 PM      Component Value Range   ABO/RH(D) O POS     Antibody Screen NEG     Sample Expiration 09/12/2012      US Ob Comp Less 14 Wks  09/09/2012  *RADIOLOGY REPORT*  Clinical Data: Left lower quadrant abdominal pain, spotting, beta HCG - 1193  OBSTETRIC <14 WK [place] AND TRANSVAGINAL OB US  Technique:  Both transabdominal and transvaginal ultrasound examinations were performed for complete evaluation of the gestation as well as the maternal uterus, adnexal regions, and pelvic cul-de-sac.  Transvaginal technique was performed to assess early pregnancy.  Examination was reviewed with the performaning sonographer.  Comparison:  None.  Intrauterine gestational sac:  Not visualized  Maternal uterus/adnexae:  Anteverted uterus.  There is focal geographic thickening of the endometrium at the level of the fundus measuring approximately 1.9 cm in diameter (image 59).  Arising from the left ovary is an approximately 1.9 x 1.8 x 1.9 cm possible corpus luteal cyst (image 49).  There is echogenic debris within the adnexal component of the dilated fallopian tube (best seen on cinegraphic image).  There is an approximately 2.1 x 1.8 x 2.1 cm mixed echogenic structure within the right adnexa (image 109) which is in the indeterminate location.  No free fluid in the pelvis.  IMPRESSION: 1.  An intrauterine gestational sac is not identified on this examination.  Differential considerations include early viable intrauterine pregnant, failed intrauterine pregnancy or ectopic. Correlation with serial Beta HCG and follow-up pelvic ultrasound is recommended.  2.  Indeterminate approximately 2.1 cm mixed echogenic structure within the right adnexa.  As ectopic pregnancy is in the differential consideration, close attention on follow-up is recommended.  3.  Echogenic debris within a dilated left-sided fallopian tube. Nonspecific but may be seen in this setting of PID, though note, interstitial ectopic may have a similar appearance.  Again, continued attention on follow-up is recommended.  Above findings Dr. Bebe Shaggy at 2118.   Original Report Authenticated By: Waynard Reeds, M.D.    US Ob Transvaginal  09/09/2012  *RADIOLOGY REPORT*  Clinical Data: Left lower quadrant abdominal pain, spotting, beta HCG - 1193  OBSTETRIC <14 WK [place] AND TRANSVAGINAL OB US  Technique:  Both transabdominal and transvaginal ultrasound examinations were performed for complete evaluation of the gestation as well as the maternal uterus, adnexal regions, and pelvic cul-de-sac.  Transvaginal technique was performed to assess early pregnancy.  Examination was reviewed with the performaning sonographer.  Comparison:  None.  Intrauterine gestational sac:  Not visualized  Maternal uterus/adnexae:  Anteverted uterus.  There is focal geographic thickening of the endometrium at the level of the fundus measuring approximately 1.9 cm in diameter (image 59).  Arising from the left ovary is an approximately 1.9 x 1.8 x 1.9 cm possible corpus luteal cyst (image 49).  There is echogenic debris within the adnexal component of the dilated fallopian tube (best seen on cinegraphic image).  There is an approximately 2.1 x 1.8 x 2.1 cm mixed echogenic structure within the right adnexa (image 109) which is in the indeterminate location.  No free fluid in the pelvis.  IMPRESSION: 1.  An  intrauterine gestational sac is not identified on this examination.  Differential considerations include early viable intrauterine pregnant, failed intrauterine pregnancy or ectopic. Correlation with serial Beta HCG and follow-up  pelvic ultrasound is recommended. 2.  Indeterminate approximately 2.1 cm mixed echogenic structure within the right adnexa.  As ectopic pregnancy is in the differential consideration, close attention on follow-up is recommended.  3.  Echogenic debris within a dilated left-sided fallopian tube. Nonspecific but may be seen in this setting of PID, though note, interstitial ectopic may have a similar appearance.  Again, continued attention on follow-up is recommended.  Above findings Dr. Bebe Shaggy at 2118.   Original Report Authenticated By: Waynard Reeds, M.D.     Assessment/Plan:  Probable Left ectopic, declining further serial evaluation, for Diagnostic laparoscopy and possible left salpingectomy, or salpingostomy in a.m.  Iziah Cates V 09/09/2012, 10:28 PM

## 2012-09-09 NOTE — ED Notes (Signed)
Patient remains in ultrasound at this time.

## 2012-09-09 NOTE — ED Notes (Signed)
Patient transported to X-ray 

## 2012-09-10 ENCOUNTER — Encounter (HOSPITAL_COMMUNITY): Payer: Self-pay | Admitting: General Practice

## 2012-09-10 ENCOUNTER — Encounter (HOSPITAL_COMMUNITY): Payer: Self-pay | Admitting: Anesthesiology

## 2012-09-10 ENCOUNTER — Observation Stay (HOSPITAL_COMMUNITY): Payer: BC Managed Care – PPO | Admitting: Anesthesiology

## 2012-09-10 ENCOUNTER — Encounter (HOSPITAL_COMMUNITY): Admission: EM | Disposition: A | Discharge status: Home / Self Care | Payer: Self-pay | Source: Home / Self Care

## 2012-09-10 LAB — SURGICAL PCR SCREEN
MRSA, PCR: NEGATIVE
Staphylococcus aureus: NEGATIVE

## 2012-09-10 SURGERY — SALPINGECTOMY, UNILATERAL, LAPAROSCOPIC
Anesthesia: General | Wound class: Clean Contaminated

## 2012-09-10 MED ORDER — OXYCODONE-ACETAMINOPHEN 5-325 MG PO TABS: 1.0000 | ORAL_TABLET | ORAL | Status: DC | PRN

## 2012-09-10 MED ORDER — FENTANYL CITRATE 0.05 MG/ML IJ SOLN
INTRAMUSCULAR | Status: AC
  Filled 2012-09-10: qty 2

## 2012-09-10 MED ORDER — MIDAZOLAM HCL 2 MG/2ML IJ SOLN
1.0000 mg | INTRAMUSCULAR | Status: DC | PRN
  Administered 2012-09-10: 2 mg via INTRAVENOUS

## 2012-09-10 MED ORDER — ONDANSETRON HCL 4 MG/2ML IJ SOLN
4.0000 mg | Freq: Once | INTRAMUSCULAR | Status: AC | PRN
  Administered 2012-09-10: 4 mg via INTRAVENOUS

## 2012-09-10 MED ORDER — ONDANSETRON HCL 4 MG/2ML IJ SOLN
4.0000 mg | INTRAMUSCULAR | Status: DC | PRN
  Administered 2012-09-10 (×2): 4 mg via INTRAVENOUS
  Filled 2012-09-10 (×2): qty 2

## 2012-09-10 MED ORDER — GLYCOPYRROLATE 0.2 MG/ML IJ SOLN
0.2000 mg | Freq: Once | INTRAMUSCULAR | Status: AC
  Administered 2012-09-10: 0.2 mg via INTRAVENOUS

## 2012-09-10 MED ORDER — ROCURONIUM BROMIDE 50 MG/5ML IV SOLN
INTRAVENOUS | Status: AC
  Filled 2012-09-10: qty 1

## 2012-09-10 MED ORDER — ONDANSETRON HCL 4 MG/2ML IJ SOLN
INTRAMUSCULAR | Status: AC
  Filled 2012-09-10: qty 2

## 2012-09-10 MED ORDER — DEXAMETHASONE SODIUM PHOSPHATE 4 MG/ML IJ SOLN
INTRAMUSCULAR | Status: AC
  Filled 2012-09-10: qty 1

## 2012-09-10 MED ORDER — 0.9 % SODIUM CHLORIDE (POUR BTL) OPTIME
TOPICAL | Status: DC | PRN
  Administered 2012-09-10: 1000 mL

## 2012-09-10 MED ORDER — ONDANSETRON HCL 4 MG/2ML IJ SOLN
4.0000 mg | Freq: Once | INTRAMUSCULAR | Status: AC
  Administered 2012-09-10: 4 mg via INTRAVENOUS
  Filled 2012-09-10: qty 2

## 2012-09-10 MED ORDER — PROPOFOL 10 MG/ML IV EMUL
INTRAVENOUS | Status: AC
  Filled 2012-09-10: qty 20

## 2012-09-10 MED ORDER — LACTATED RINGERS IV SOLN
INTRAVENOUS | Status: DC
  Administered 2012-09-10: 1000 mL via INTRAVENOUS

## 2012-09-10 MED ORDER — LIDOCAINE HCL 1 % IJ SOLN
INTRAMUSCULAR | Status: DC | PRN
  Administered 2012-09-10: 30 mg via INTRADERMAL

## 2012-09-10 MED ORDER — BUPIVACAINE-EPINEPHRINE PF 0.5-1:200000 % IJ SOLN
INTRAMUSCULAR | Status: AC
  Filled 2012-09-10: qty 10

## 2012-09-10 MED ORDER — DEXAMETHASONE SODIUM PHOSPHATE 4 MG/ML IJ SOLN
4.0000 mg | Freq: Once | INTRAMUSCULAR | Status: AC
  Administered 2012-09-10: 4 mg via INTRAVENOUS

## 2012-09-10 MED ORDER — GLYCOPYRROLATE 0.2 MG/ML IJ SOLN
INTRAMUSCULAR | Status: DC | PRN
  Administered 2012-09-10: 0.4 mg via INTRAVENOUS

## 2012-09-10 MED ORDER — ONDANSETRON HCL 4 MG/2ML IJ SOLN
4.0000 mg | Freq: Once | INTRAMUSCULAR | Status: AC
  Administered 2012-09-10: 4 mg via INTRAVENOUS

## 2012-09-10 MED ORDER — MIDAZOLAM HCL 5 MG/5ML IJ SOLN
INTRAMUSCULAR | Status: DC | PRN
  Administered 2012-09-10: 2 mg via INTRAVENOUS

## 2012-09-10 MED ORDER — MIDAZOLAM HCL 2 MG/2ML IJ SOLN
INTRAMUSCULAR | Status: AC
  Filled 2012-09-10: qty 2

## 2012-09-10 MED ORDER — GLYCOPYRROLATE 0.2 MG/ML IJ SOLN
INTRAMUSCULAR | Status: AC
  Filled 2012-09-10: qty 2

## 2012-09-10 MED ORDER — FENTANYL CITRATE 0.05 MG/ML IJ SOLN
25.0000 ug | INTRAMUSCULAR | Status: DC | PRN
  Administered 2012-09-10 (×2): 50 ug via INTRAVENOUS

## 2012-09-10 MED ORDER — GLYCOPYRROLATE 0.2 MG/ML IJ SOLN
INTRAMUSCULAR | Status: AC
  Filled 2012-09-10: qty 1

## 2012-09-10 MED ORDER — FENTANYL CITRATE 0.05 MG/ML IJ SOLN
INTRAMUSCULAR | Status: AC
  Filled 2012-09-10: qty 5

## 2012-09-10 MED ORDER — FENTANYL CITRATE 0.05 MG/ML IJ SOLN
INTRAMUSCULAR | Status: DC | PRN
  Administered 2012-09-10 (×5): 50 ug via INTRAVENOUS

## 2012-09-10 MED ORDER — LIDOCAINE HCL (PF) 1 % IJ SOLN
INTRAMUSCULAR | Status: AC
  Filled 2012-09-10: qty 5

## 2012-09-10 MED ORDER — NEOSTIGMINE METHYLSULFATE 1 MG/ML IJ SOLN
INTRAMUSCULAR | Status: DC | PRN
  Administered 2012-09-10: 3 mg via INTRAVENOUS

## 2012-09-10 MED ORDER — PROPOFOL 10 MG/ML IV BOLUS
INTRAVENOUS | Status: DC | PRN
  Administered 2012-09-10: 150 mg via INTRAVENOUS

## 2012-09-10 MED ORDER — BUPIVACAINE-EPINEPHRINE PF 0.5-1:200000 % IJ SOLN
INTRAMUSCULAR | Status: DC | PRN
  Administered 2012-09-10: 10 mL

## 2012-09-10 MED ORDER — ROCURONIUM BROMIDE 100 MG/10ML IV SOLN
INTRAVENOUS | Status: DC | PRN
  Administered 2012-09-10: 30 mg via INTRAVENOUS

## 2012-09-10 SURGICAL SUPPLY — 34 items
BAG HAMPER (MISCELLANEOUS) ×3 IMPLANT
BLADE SURG SZ11 CARB STEEL (BLADE) ×3 IMPLANT
CLOSURE STERI-STRIP 1/4X4 (GAUZE/BANDAGES/DRESSINGS) ×3 IMPLANT
CLOTH BEACON ORANGE TIMEOUT ST (SAFETY) ×3 IMPLANT
COVER LIGHT HANDLE STERIS (MISCELLANEOUS) ×6 IMPLANT
DRESSING COVERLET 3X1 FLEXIBLE (GAUZE/BANDAGES/DRESSINGS) ×9 IMPLANT
ELECT REM PT RETURN 9FT ADLT (ELECTROSURGICAL) ×3
ELECTRODE REM PT RTRN 9FT ADLT (ELECTROSURGICAL) ×2 IMPLANT
GLOVE ECLIPSE 9.0 STRL (GLOVE) ×3 IMPLANT
GLOVE INDICATOR STER SZ 9 (GLOVE) ×3 IMPLANT
GOWN STRL REIN 3XL LVL4 (GOWN DISPOSABLE) ×3 IMPLANT
GOWN STRL REIN XL XLG (GOWN DISPOSABLE) ×3 IMPLANT
INST SET LAPROSCOPIC GYN AP (KITS) ×3 IMPLANT
KIT ROOM TURNOVER AP CYSTO (KITS) ×3 IMPLANT
MANIFOLD NEPTUNE II (INSTRUMENTS) ×3 IMPLANT
NEEDLE HYPO 18GX1.5 BLUNT FILL (NEEDLE) ×3 IMPLANT
NEEDLE INSUFFLATION 14GA 120MM (NEEDLE) ×3 IMPLANT
PACK PERI GYN (CUSTOM PROCEDURE TRAY) ×3 IMPLANT
PAD ARMBOARD 7.5X6 YLW CONV (MISCELLANEOUS) ×3 IMPLANT
SCALPEL HARMONIC ACE (MISCELLANEOUS) ×3 IMPLANT
SET BASIN LINEN APH (SET/KITS/TRAYS/PACK) ×3 IMPLANT
SET TUBE IRRIG SUCTION NO TIP (IRRIGATION / IRRIGATOR) IMPLANT
SOL PREP PROV IODINE SCRUB 4OZ (MISCELLANEOUS) ×3 IMPLANT
SOLUTION ANTI FOG 6CC (MISCELLANEOUS) ×3 IMPLANT
STRIP CLOSURE SKIN 1/4X3 (GAUZE/BANDAGES/DRESSINGS) ×3 IMPLANT
SUT VICRYL 0 UR6 27IN ABS (SUTURE) ×3 IMPLANT
SUT VICRYL AB 3-0 FS1 BRD 27IN (SUTURE) ×3 IMPLANT
SYR BULB IRRIGATION 50ML (SYRINGE) ×3 IMPLANT
SYR CONTROL 10ML LL (SYRINGE) ×3 IMPLANT
SYRINGE 10CC LL (SYRINGE) ×3 IMPLANT
TRAY FOLEY CATH 14FR (SET/KITS/TRAYS/PACK) ×3 IMPLANT
TUBING DYE INJECTION 900-617 (MISCELLANEOUS) IMPLANT
TUBING INSUFFLATION HIGH FLOW (TUBING) ×3 IMPLANT
WARMER LAPAROSCOPE (MISCELLANEOUS) ×3 IMPLANT

## 2012-09-10 NOTE — Op Note (Signed)
See operative note details included in the brief operative note 

## 2012-09-10 NOTE — Brief Op Note (Signed)
09/09/2012 - 09/10/2012  10:10 AM  PATIENT:  Gwendolyn Glass  31 y.o. female  PRE-OPERATIVE DIAGNOSIS:  left ectopic pregnancy  POST-OPERATIVE DIAGNOSIS:  left ectopic pregnancy  PROCEDURE:  Procedure(s) (LRB) with comments: LAPAROSCOPIC UNILATERAL SALPINGECTOMY (Left)  SURGEON:  Surgeon(s) and Role:    * Tilda Burrow, MD - Primary  PHYSICIAN ASSISTANT:   ASSISTANTS: none   ANESTHESIA:   local and general  EBL:  Total I/O In: 600 [I.V.:600] Out: 100 [Urine:100]  BLOOD ADMINISTERED:none  DRAINS: none   LOCAL MEDICATIONS USED:  MARCAINE    and Amount: 10 ml  SPECIMEN:  Source of Specimen:  left tube and paratubal cyst  DISPOSITION OF SPECIMEN:  PATHOLOGY  COUNTS:  YES  TOURNIQUET:  * No tourniquets in log *  DICTATION: .Dragon Dictation Patient was taken to the operating room prepped and draped for abdominal surgery with ring forceps in the vagina to manipulate the uterus. Foley catheter was in place. Timeout was conducted. No antibiotics were required. The abdomen was prepped and draped, an infraumbilical vertical 1 cm skin incision made as well as a transverse suprapubic incision and right lower quadrant incision of similar length. Veress needle was used to achieve pneumoperitoneum through the umbilicus with water droplet test used to confirm intraperitoneal location. Pneumoperitoneum with 3 L CO2 was followed by direct insertion of the 11 mm umbilical trocar under direct visualization and then suprapubic trocar was placed in the 5 mm as was the right lower quadrant insertion trocar. Attention was directed the pelvis were some small amount of blood in the abdominal cavity was encountered. The left tube was inspected and the distal port half of the tube was grossly hemorrhagic and distorted. Tubal salvage was not considered likely. Using the harmonic scalpel and left salpingectomy was performed removing the tube and attached paratubal cyst, which were extracted using the  Endo Catch bag placed through the umbilicus after the camera had been changed to a 5 mm trocar and camera used through the right lower quadrant port.. Once the specimen was extracted 120 cc of saline was used to irrigate the abdomen leaving fluid inside the abdominal cavity and in the instruments removed, with the umbilical incision closed at the fascial level with 0 Vicryl in all 3 incisions closed with subcuticular 4-0 Vicryl. Steri-Strips were applied. Blood type is confirmed as ORh+, so RhoGAM is not required. PLAN OF CARE: Discharge to home after PACU  PATIENT DISPOSITION:  PACU - hemodynamically stable.   Delay start of Pharmacological VTE agent (>24hrs) due to surgical blood loss or risk of bleeding: not applicable

## 2012-09-10 NOTE — Progress Notes (Signed)
Discharge instructions given to pt. With teach back given to RN. Pt. Taken to car via W/C. 

## 2012-09-10 NOTE — Discharge Summary (Signed)
Physician Discharge Summary  Patient ID: Gwendolyn Glass MRN: [id number] DOB/AGE: 1981-08-19 31 y.o.  Admit date: 09/09/2012 Discharge date: 09/10/2012  Admission Diagnoses:left ectopic pregnancy  Discharge Diagnoses: :left ectopic pregnancy   Active Problems:  * No active hospital problems. *    Discharged Condition: good  Hospital Course: Gwendolyn Glass was admitted late on the night of October 9 with a presumed diagnosis of a left unruptured ectopic pregnancy. She was observed overnight kept n.p.o. and taken to the operating room approximately 9:30 AM for left salpingectomy after confirmation of the diagnosis of ectopic. The right T-tube was grossly normal externally there were a couple small filmy adhesions on the right. The left tube did have some peritubal as well as. Ovarian adhesions .  Consults: None  Significant Diagnostic Studies: Quantitative hCG 1100, blood type O positive  Treatments: surgery: Diagnostic laparoscopy, left salpingectomy  Discharge Exam: Blood pressure 109/68, pulse 104, temperature 98.1 F (36.7 C), temperature source Oral, resp. rate 18, height 5\' 8"  (1.727 m), weight 90.719 kg (200 lb), last menstrual period 06/17/2012, SpO2 97.00%. General appearance: alert, cooperative and no distress GI: soft, non-tender; bowel sounds normal; no masses,  no organomegaly Incision/Wound: clean dry and intact Disposition: 01-Home or Self Care  Discharge Orders    Future Orders Please Complete By Expires   Diet - low sodium heart healthy      Increase activity slowly      Discharge instructions      Comments:   General Gynecological Post-Operative Instructions You may expect to feel dizzy, weak, and drowsy for as long as 24 hours after receiving the medicine that made you sleep (anesthetic). The following information pertains to your recovery period for the first 24 hours following surgery.  Do not drive a car, ride a bicycle, participate in physical activities,  or take public transportation until you are done taking narcotic pain medicines or as directed by your caregiver.  Do not drink alcohol or take tranquilizers.  Call our office at [phone] for any amount for any problem Do not take medicine that has not been prescribed by your caregiver.  Do not sign important papers or make important decisions while on narcotic pain medicines.  Have a responsible person with you.  CARE OF INCISION  Keep incision clean and dry. Take showers instead of baths until your caregiver gives you permission to take baths, in 3 days  Avoid heavy lifting (more than 10 pounds/4.5 kilograms), pushing, or pulling.  Avoid activities that may risk injury to your surgical site.  Only take over-the-counter or prescription medicines for pain, discomfort, or fever as directed by your caregiver. Do not take aspirin. It can make you bleed. Take medicines (antibiotics) that kill germs as directed.  Call the office or go to the MAU if:  You feel sick to your stomach (nauseous).  You start to throw up (vomit).  You have trouble eating or drinking.  You have an oral temperature above 100.4.  You have constipation that is not helped by adjusting diet or increasing fluid intake. Pain medicines are a common cause of constipation.  SEEK IMMEDIATE MEDICAL CARE IF:  You have persistent dizziness.  You have difficulty breathing or a congested sounding (croupy) cough.  You have an oral temperature above 102.5, not controlled by medicine.  There is increasing pain or tenderness near or in the surgical site.  ExitCare Patient Information 2011 [place], [place].   Driving Restrictions      Comments:   No driving  for 24 hours   Sexual Activity Restrictions      Comments:   No sexual activity for 2 weeks, expect heavy menstrual periods to begin in 2 days   Remove dressing in 24 hours      Scheduling Instructions:   Leave the Steri-Strips on the skin for 3-5 days. There may be left longer if  intact and not coming loose at the ends       Medication List     As of 09/10/2012  9:48 PM    TAKE these medications         citalopram 20 MG tablet   Commonly known as: CELEXA   Take 20 mg by mouth daily.      multivitamin tablet   Take 1 tablet by mouth daily.      oxyCODONE-acetaminophen 5-325 MG per tablet   Commonly known as: PERCOCET/ROXICET   Take 1 tablet by mouth every 4 (four) hours as needed for pain.      oxyCODONE-acetaminophen 5-325 MG per tablet   Commonly known as: PERCOCET/ROXICET   Take 1 tablet by mouth every 4 (four) hours as needed for pain.           Follow-up Information    In 1 week to follow up. ([facility] [phone] as needed)          Signed: Tilda Burrow 09/10/2012, 9:48 PM

## 2012-09-10 NOTE — Anesthesia Preprocedure Evaluation (Addendum)
Anesthesia Evaluation  Patient identified by MRN, date of birth, ID band Patient awake    Reviewed: Allergy & Precautions, H&P , NPO status , Patient's Chart, lab work & pertinent test results  History of Anesthesia Complications Negative for: history of anesthetic complications  Airway Mallampati: II TM Distance: >3 FB     Dental  (+) Teeth Intact and Poor Dentition   Pulmonary neg pulmonary ROS,  breath sounds clear to auscultation        Cardiovascular negative cardio ROS  Rhythm:Regular Rate:Tachycardia     Neuro/Psych    GI/Hepatic GERD- (with nausea.)  ,  Endo/Other    Renal/GU      Musculoskeletal   Abdominal   Peds  Hematology   Anesthesia Other Findings Aborted pregnancy in progress, still with some vaginal bleeding/spotting.  Reproductive/Obstetrics (+) Pregnancy                           Anesthesia Physical Anesthesia Plan  ASA: II  Anesthesia Plan: General   Post-op Pain Management:    Induction: Intravenous, Rapid sequence and Cricoid pressure planned  Airway Management Planned: Oral ETT  Additional Equipment:   Intra-op Plan:   Post-operative Plan: Extubation in OR  Informed Consent: I have reviewed the patients History and Physical, chart, labs and discussed the procedure including the risks, benefits and alternatives for the proposed anesthesia with the patient or authorized representative who has indicated his/her understanding and acceptance.     Plan Discussed with:   Anesthesia Plan Comments:         Anesthesia Quick Evaluation

## 2012-09-10 NOTE — Anesthesia Procedure Notes (Signed)
Procedure Name: Intubation Date/Time: 09/10/2012 9:24 AM Performed by: Glynn Octave E Pre-anesthesia Checklist: Patient identified, Patient being monitored, Timeout performed, Emergency Drugs available and Suction available Patient Re-evaluated:Patient Re-evaluated prior to inductionOxygen Delivery Method: Circle System Utilized Preoxygenation: Pre-oxygenation with 100% oxygen Intubation Type: IV induction, Rapid sequence and Cricoid Pressure applied Ventilation: Mask ventilation without difficulty Laryngoscope Size: Mac and 3 Grade View: Grade I Tube type: Oral Tube size: 7.0 mm Number of attempts: 1 Airway Equipment and Method: stylet Placement Confirmation: ETT inserted through vocal cords under direct vision,  positive ETCO2 and breath sounds checked- equal and bilateral Secured at: 21 cm Tube secured with: Tape Dental Injury: Teeth and Oropharynx as per pre-operative assessment

## 2012-09-10 NOTE — Progress Notes (Signed)
Subjective: Patient reports nausea.  Pain in LLQ persists, requiring analgesics    Objective: I have reviewed patient's vital signs, labs and patient reports allergy to TORADOL.  General: alert, cooperative and mild distress GI: normal findings: bowel sounds normal and abnormal findings:  LLQ discomfort,guarding   Assessment/Plan: To OR this morning for laparoscopy.  LOS: 1 day    [name] V 09/10/2012, 8:18 AM

## 2012-09-10 NOTE — Anesthesia Postprocedure Evaluation (Signed)
  Anesthesia Post-op Note  Patient: Gwendolyn Glass  Procedure(s) Performed: Procedure(s) (LRB) with comments: LAPAROSCOPIC UNILATERAL SALPINGECTOMY (Left)  Patient Location: PACU  Anesthesia Type: General  Level of Consciousness: awake, alert  and oriented  Airway and Oxygen Therapy: Patient Spontanous Breathing and Patient connected to face mask oxygen  Post-op Pain: mild  Post-op Assessment: Post-op Vital signs reviewed, Patient's Cardiovascular Status Stable, Respiratory Function Stable, Patent Airway and No signs of Nausea or vomiting  Post-op Vital Signs: Reviewed and stable  Complications: No apparent anesthesia complications

## 2012-09-10 NOTE — Progress Notes (Signed)
Patient was complaining of nausea. Doctor ordered and new orders given for zofran once.

## 2012-09-10 NOTE — Addendum Note (Signed)
Addendum  created 09/10/12 1033 by Adir Schicker E Icyss Skog, CRNA   Modules edited:Anesthesia Medication Administration    

## 2012-09-10 NOTE — Addendum Note (Signed)
Addendum  created 09/10/12 1033 by Moshe Salisbury, CRNA   Modules edited:Anesthesia Medication Administration

## 2012-09-10 NOTE — Transfer of Care (Signed)
Immediate Anesthesia Transfer of Care Note  Patient: Gwendolyn Glass  Procedure(s) Performed: Procedure(s) (LRB) with comments: LAPAROSCOPIC UNILATERAL SALPINGECTOMY (Left)  Patient Location: PACU  Anesthesia Type: General  Level of Consciousness: awake  Airway & Oxygen Therapy: Patient Spontanous Breathing and Patient connected to face mask oxygen  Post-op Assessment: Report given to PACU RN  Post vital signs: Reviewed and stable  Complications: No apparent anesthesia complications

## 2012-09-10 NOTE — Progress Notes (Signed)
UR Chart Review Completed  

## 2014-04-17 DIAGNOSIS — F17200 Nicotine dependence, unspecified, uncomplicated: Secondary | ICD-10-CM | POA: Insufficient documentation

## 2014-04-17 DIAGNOSIS — M79609 Pain in unspecified limb: Secondary | ICD-10-CM | POA: Insufficient documentation

## 2014-04-17 DIAGNOSIS — R209 Unspecified disturbances of skin sensation: Secondary | ICD-10-CM | POA: Insufficient documentation

## 2014-04-17 DIAGNOSIS — Z79899 Other long term (current) drug therapy: Secondary | ICD-10-CM | POA: Insufficient documentation

## 2014-04-18 ENCOUNTER — Emergency Department (HOSPITAL_COMMUNITY)
Admission: EM | Admit: 2014-04-18 | Discharge: 2014-04-18 | Disposition: A | Discharge status: Home / Self Care | Payer: BC Managed Care – PPO | Attending: Emergency Medicine | Admitting: Emergency Medicine

## 2014-04-18 ENCOUNTER — Encounter (HOSPITAL_COMMUNITY): Payer: Self-pay | Admitting: Emergency Medicine

## 2014-04-18 DIAGNOSIS — R200 Anesthesia of skin: Secondary | ICD-10-CM

## 2014-04-18 DIAGNOSIS — M79605 Pain in left leg: Secondary | ICD-10-CM

## 2014-04-18 MED ORDER — GABAPENTIN 100 MG PO CAPS: 100.0000 mg | ORAL_CAPSULE | Freq: Three times a day (TID) | ORAL | Status: DC

## 2014-04-18 NOTE — Discharge Instructions (Signed)
Follow up with your doctor to see if you need more tests, and whether the dose of Neurontin needs to be increased.  Gabapentin capsules or tablets What is this medicine? GABAPENTIN (GA ba pen tin) is used to control partial seizures in adults with epilepsy. It is also used to treat certain types of nerve pain. This medicine may be used for other purposes; ask your health care provider or pharmacist if you have questions. COMMON BRAND NAME(S): Orpha Bur , Neurontin What should I tell my health care provider before I take this medicine? They need to know if you have any of these conditions: -kidney disease -suicidal thoughts, plans, or attempt; a previous suicide attempt by you or a family member -an unusual or allergic reaction to gabapentin, other medicines, foods, dyes, or preservatives -pregnant or trying to get pregnant -breast-feeding How should I use this medicine? Take this medicine by mouth with a glass of water. Follow the directions on the prescription label. You can take it with or without food. If it upsets your stomach, take it with food.Take your medicine at regular intervals. Do not take it more often than directed. Do not stop taking except on your doctor's advice. If you are directed to break the 600 or 800 mg tablets in half as part of your dose, the extra half tablet should be used for the next dose. If you have not used the extra half tablet within 28 days, it should be thrown away. A special MedGuide will be given to you by the pharmacist with each prescription and refill. Be sure to read this information carefully each time. Talk to your pediatrician regarding the use of this medicine in children. Special care may be needed. Overdosage: If you think you have taken too much of this medicine contact a poison control center or emergency room at once. NOTE: This medicine is only for you. Do not share this medicine with others. What if I miss a dose? If you miss a dose, take it  as soon as you can. If it is almost time for your next dose, take only that dose. Do not take double or extra doses. What may interact with this medicine? Do not take this medicine with any of the following medications: -other gabapentin products This medicine may also interact with the following medications: -alcohol -antacids -antihistamines for allergy, cough and cold -certain medicines for anxiety or sleep -certain medicines for depression or psychotic disturbances -homatropine; hydrocodone -naproxen -narcotic medicines (opiates) for pain -phenothiazines like chlorpromazine, mesoridazine, prochlorperazine, thioridazine This list may not describe all possible interactions. Give your health care provider a list of all the medicines, herbs, non-prescription drugs, or dietary supplements you use. Also tell them if you smoke, drink alcohol, or use illegal drugs. Some items may interact with your medicine. What should I watch for while using this medicine? Visit your doctor or health care professional for regular checks on your progress. You may want to keep a record at home of how you feel your condition is responding to treatment. You may want to share this information with your doctor or health care professional at each visit. You should contact your doctor or health care professional if your seizures get worse or if you have any new types of seizures. Do not stop taking this medicine or any of your seizure medicines unless instructed by your doctor or health care professional. Stopping your medicine suddenly can increase your seizures or their severity. Wear a medical identification bracelet or chain if  you are taking this medicine for seizures, and carry a card that lists all your medications. You may get drowsy, dizzy, or have blurred vision. Do not drive, use machinery, or do anything that needs mental alertness until you know how this medicine affects you. To reduce dizzy or fainting spells,  do not sit or stand up quickly, especially if you are an older patient. Alcohol can increase drowsiness and dizziness. Avoid alcoholic drinks. Your mouth may get dry. Chewing sugarless gum or sucking hard candy, and drinking plenty of water will help. The use of this medicine may increase the chance of suicidal thoughts or actions. Pay special attention to how you are responding while on this medicine. Any worsening of mood, or thoughts of suicide or dying should be reported to your health care professional right away. Women who become pregnant while using this medicine may enroll in the [place] Pregnancy Registry by calling [phone]. This registry collects information about the safety of antiepileptic drug use during pregnancy. What side effects may I notice from receiving this medicine? Side effects that you should report to your doctor or health care professional as soon as possible: -allergic reactions like skin rash, itching or hives, swelling of the face, lips, or tongue -worsening of mood, thoughts or actions of suicide or dying Side effects that usually do not require medical attention (report to your doctor or health care professional if they continue or are bothersome): -constipation -difficulty walking or controlling muscle movements -dizziness -nausea -slurred speech -tiredness -tremors -weight gain This list may not describe all possible side effects. Call your doctor for medical advice about side effects. You may report side effects to FDA at 1-800-FDA-1088. Where should I keep my medicine? Keep out of reach of children. Store at room temperature between 15 and 30 degrees C (59 and 86 degrees F). Throw away any unused medicine after the expiration date. NOTE: This sheet is a summary. It may not cover all possible information. If you have questions about this medicine, talk to your doctor, pharmacist, or health care provider.  2014, [employer]. (2013-07-22 09:12:48)

## 2014-04-18 NOTE — ED Notes (Signed)
Pt states her left leg is painful and numb x 1 week.  Pt states she was seen at [facility] a week ago and told it was coming from her migraine headache.  Pt states she received percocet but is not taking them because it is not helping.  Pt statres she does not want pain meds she just wants to know what is wrong

## 2014-04-18 NOTE — ED Provider Notes (Signed)
CSN: [id number]     Arrival date & time 04/17/14  2359 History   First MD Initiated Contact with Patient 04/18/14 0203     Chief Complaint  Patient presents with  . Leg Pain     (Consider location/radiation/quality/duration/timing/severity/associated sxs/prior Treatment) Patient is a 33 y.o. female presenting with leg pain. The history is provided by the patient.  Leg Pain She has had numbness and a burning pain in her left leg for the last week. It involves her entire leg but does not go into her hip or back or abdomen. The sensation is getting worse but is not spreading. She rates the discomfort at 8/10. It is slightly better while he the leg is moving medicine as she stops, it gets worse. She denies any back pain and denies any bowel or bladder dysfunction. She denies any weakness in the leg. She did go to the emergency department at [facility] and was given a prescription for oxycodone-acetaminophen which gives only slight relief of pain. 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 She denies any recent trauma or any unusual activity.  Past Medical History  Diagnosis Date  . Pregnant   . Miscarriage     pt. states shes  spotting  and wearing a tampon   Past Surgical History  Procedure Laterality Date  . Appendectomy    . Cesarean section    . Cholecystectomy    . Cholecystectomy  07/28/2012    Procedure: LAPAROSCOPIC CHOLECYSTECTOMY;  Surgeon: Donato Heinz, MD;  Location: [facility];  Service: General;  Laterality: N/A;   No family history on file. History  Substance Use Topics  . Smoking status: Current Every Day Smoker  . Smokeless tobacco: Not on file  . Alcohol Use: Yes   OB History   Grav Para Term Preterm Abortions TAB SAB Ect Mult Living                 Review of Systems  All other systems reviewed and are negative.     Allergies  Cinnamon; Vicodin; Bee venom; Dilaudid; and Toradol  Home Medications   Prior to Admission  medications   Medication Sig Start Date End Date Taking? Authorizing Provider  oxyCODONE-acetaminophen (PERCOCET/ROXICET) 5-325 MG per tablet Take 1 tablet by mouth every 4 (four) hours as needed for pain. 09/10/12  Yes Jonnie Kind, MD  citalopram (CELEXA) 20 MG tablet Take 20 mg by mouth daily.    Historical Provider, MD  Multiple Vitamin (MULTIVITAMIN) tablet Take 1 tablet by mouth daily.    Historical Provider, MD  oxyCODONE-acetaminophen (PERCOCET/ROXICET) 5-325 MG per tablet Take 1 tablet by mouth every 4 (four) hours as needed for pain. 09/10/12   Jonnie Kind, MD   BP 135/84  Pulse 97  Temp(Src) 98.4 F (36.9 C) (Oral)  Resp 16  Ht 5\' 8"  (1.727 m)  Wt 200 lb (90.719 kg)  BMI 30.42 kg/m2  SpO2 100%  LMP 04/04/2014 Physical Exam  Nursing note and vitals reviewed.  33 year old female, resting comfortably and in no acute distress. Vital signs are normal. Oxygen saturation is 100%, which is normal. Head is normocephalic and atraumatic. PERRLA, EOMI. Oropharynx is clear. Neck is nontender and supple without adenopathy or JVD. Back is nontender and there is no CVA tenderness. Straight leg raise is negative. Lungs are clear without rales, wheezes, or rhonchi. Chest is nontender. Heart has regular rate and rhythm without murmur. Abdomen is soft,  flat, nontender without masses or hepatosplenomegaly and peristalsis is normoactive. Extremities have no cyanosis or edema, full range of motion is present. Dorsalis pedis pulses are strong and capillary refill is prompt. Skin is warm and dry without rash. Neurologic: Mental status is normal, cranial nerves are intact, there are no motor or sensory deficits. Careful motor and sensory exam of the lower extremities is completely normal  ED Course  Procedures (including critical care time)  MDM   Final diagnoses:  Numbness in left leg  Pain of left leg    Neurolytic-type pain of the leg of uncertain cause. It does not follow a  dermatome distribution to suggest herpes zoster. There is no evidence of any radiculopathy. She will empirically be tried on gabapentin and is given a prescription for same. I have recommended that she followup with her PCP in 3 days to assess initial response to gabapentin and to decide whether additional testing is needed at that point and whether gabapentin dose needs to be increased. I do not see any indication for laboratory testing or imaging tonight. Old records are reviewed, and there are no relevant past visits.    Delora Fuel, MD 53/74/82 [id number]

## 2014-09-02 ENCOUNTER — Encounter (HOSPITAL_COMMUNITY): Payer: Self-pay | Admitting: Emergency Medicine

## 2014-09-02 ENCOUNTER — Emergency Department (HOSPITAL_COMMUNITY)
Admission: EM | Admit: 2014-09-02 | Discharge: 2014-09-03 | Disposition: A | Discharge status: Other Acute Inpatient Hospital | Payer: BC Managed Care – PPO | Attending: Emergency Medicine | Admitting: Emergency Medicine

## 2014-09-02 ENCOUNTER — Emergency Department (HOSPITAL_COMMUNITY): Payer: BC Managed Care – PPO

## 2014-09-02 DIAGNOSIS — R109 Unspecified abdominal pain: Secondary | ICD-10-CM

## 2014-09-02 DIAGNOSIS — R000 Tachycardia, unspecified: Secondary | ICD-10-CM | POA: Insufficient documentation

## 2014-09-02 DIAGNOSIS — O9989 Other specified diseases and conditions complicating pregnancy, childbirth and the puerperium: Secondary | ICD-10-CM | POA: Insufficient documentation

## 2014-09-02 DIAGNOSIS — Z9079 Acquired absence of other genital organ(s): Secondary | ICD-10-CM | POA: Insufficient documentation

## 2014-09-02 DIAGNOSIS — Z3A01 Less than 8 weeks gestation of pregnancy: Secondary | ICD-10-CM | POA: Insufficient documentation

## 2014-09-02 DIAGNOSIS — R350 Frequency of micturition: Secondary | ICD-10-CM | POA: Insufficient documentation

## 2014-09-02 DIAGNOSIS — Z9889 Other specified postprocedural states: Secondary | ICD-10-CM | POA: Insufficient documentation

## 2014-09-02 DIAGNOSIS — Z79899 Other long term (current) drug therapy: Secondary | ICD-10-CM | POA: Insufficient documentation

## 2014-09-02 DIAGNOSIS — O209 Hemorrhage in early pregnancy, unspecified: Secondary | ICD-10-CM | POA: Diagnosis present

## 2014-09-02 DIAGNOSIS — O99331 Smoking (tobacco) complicating pregnancy, first trimester: Secondary | ICD-10-CM | POA: Diagnosis not present

## 2014-09-02 DIAGNOSIS — F17210 Nicotine dependence, cigarettes, uncomplicated: Secondary | ICD-10-CM | POA: Insufficient documentation

## 2014-09-02 DIAGNOSIS — O26899 Other specified pregnancy related conditions, unspecified trimester: Secondary | ICD-10-CM

## 2014-09-02 LAB — BASIC METABOLIC PANEL
Anion gap: 14 (ref 5–15)
BUN: 6 mg/dL (ref 6–23)
CHLORIDE: 105 meq/L (ref 96–112)
CO2: 21 mEq/L (ref 19–32)
Calcium: 9.3 mg/dL (ref 8.4–10.5)
Creatinine, Ser: 0.71 mg/dL (ref 0.50–1.10)
GFR calc Af Amer: >90 mL/min (ref >90)
GLUCOSE: 99 mg/dL (ref 70–99)
POTASSIUM: 3.7 meq/L (ref 3.7–5.3)
Sodium: 140 mEq/L (ref 137–147)

## 2014-09-02 LAB — CBC WITH DIFFERENTIAL/PLATELET
Basophils Absolute: 0 10*3/uL (ref 0.0–0.1)
Basophils Relative: 0 % (ref 0–1)
EOS ABS: 0.1 10*3/uL (ref 0.0–0.7)
Eosinophils Relative: 1 % (ref 0–5)
HEMATOCRIT: 44.6 % (ref 36.0–46.0)
HEMOGLOBIN: 15.3 g/dL — AB (ref 12.0–15.0)
LYMPHS ABS: 2.6 10*3/uL (ref 0.7–4.0)
Lymphocytes Relative: 22 % (ref 12–46)
MCH: 29.7 pg (ref 26.0–34.0)
MCHC: 34.3 g/dL (ref 30.0–36.0)
MCV: 86.6 fL (ref 78.0–100.0)
MONO ABS: 0.8 10*3/uL (ref 0.1–1.0)
MONOS PCT: 7 % (ref 3–12)
NEUTROS PCT: 70 % (ref 43–77)
Neutro Abs: 8.4 10*3/uL — ABNORMAL HIGH (ref 1.7–7.7)
Platelets: 223 10*3/uL (ref 150–400)
RBC: 5.15 MIL/uL — AB (ref 3.87–5.11)
RDW: 14.5 % (ref 11.5–15.5)
WBC: 11.9 10*3/uL — ABNORMAL HIGH (ref 4.0–10.5)

## 2014-09-02 LAB — WET PREP, GENITAL
TRICH WET PREP: NONE SEEN
YEAST WET PREP: NONE SEEN

## 2014-09-02 LAB — POC URINE PREG, ED: Preg Test, Ur: POSITIVE — AB

## 2014-09-02 LAB — HCG, QUANTITATIVE, PREGNANCY: hCG, Beta Chain, Quant, S: 1353 m[IU]/mL — ABNORMAL HIGH (ref <5)

## 2014-09-02 MED ORDER — PROMETHAZINE HCL 25 MG/ML IJ SOLN
12.5000 mg | Freq: Once | INTRAMUSCULAR | Status: AC
  Administered 2014-09-02: 12.5 mg via INTRAVENOUS
  Filled 2014-09-02: qty 1

## 2014-09-02 MED ORDER — MORPHINE SULFATE 2 MG/ML IJ SOLN
1.0000 mg | Freq: Once | INTRAMUSCULAR | Status: AC
  Administered 2014-09-02: 1 mg via INTRAVENOUS
  Filled 2014-09-02: qty 1

## 2014-09-02 MED ORDER — SODIUM CHLORIDE 0.9 % IV SOLN
INTRAVENOUS | Status: DC
  Administered 2014-09-02: 23:00:00 via INTRAVENOUS

## 2014-09-02 NOTE — ED Notes (Signed)
Pt had a positive home preg test on Monday Has been spotting since then with cramping.   Hx of tubal preg  Pain RLQ  Nausea, no vomiting

## 2014-09-02 NOTE — ED Provider Notes (Signed)
CSN: [id number]     Arrival date & time 09/02/14  2132 History   First MD Initiated Contact with Patient 09/02/14 2148     Chief Complaint  Patient presents with  . Vaginal Bleeding     (Consider location/radiation/quality/duration/timing/severity/associated sxs/prior Treatment) Patient is a 33 y.o. female presenting with vaginal bleeding. The history is provided by the patient.  Vaginal Bleeding Quality:  Bright red and spotting Severity:  Mild Duration:  4 days Timing:  Intermittent Progression:  Worsening Chronicity:  New Menstrual history:  Regular Possible pregnancy: yes   Relieved by:  Nothing Worsened by:  Nothing tried Ineffective treatments:  None tried Associated symptoms: abdominal pain and nausea   Associated symptoms: no back pain, no dysuria and no fever   Risk factors: hx of ectopic pregnancy    Gwendolyn Glass is a 33 y.o. G   P  @ [redacted] weeks gestation who presents to the ED with vaginal bleeding and lower abdominal pain x 4 days. She states that today the pain became severe in the right lower quadrant of her abdomen. She has a hx of ectopic pregnancy on the left that was surgically removed 2 years ago. She also has a history of multiple SAB's. LMP 07/28/14. Past Medical History  Diagnosis Date  . Pregnant   . Miscarriage     pt. states shes  spotting  and wearing a tampon   Past Surgical History  Procedure Laterality Date  . Appendectomy    . Cesarean section    . Cholecystectomy    . Cholecystectomy  07/28/2012    Procedure: LAPAROSCOPIC CHOLECYSTECTOMY;  Surgeon: Donato Heinz, MD;  Location: [facility];  Service: General;  Laterality: N/A;  . Removal of lt fallopian tube     History reviewed. No pertinent family history. History  Substance Use Topics  . Smoking status: Current Every Day Smoker  . Smokeless tobacco: Not on file  . Alcohol Use: Yes   OB History   Grav Para Term Preterm Abortions TAB SAB Ect Mult Living                 Review of  Systems  Constitutional: Negative for fever and chills.  HENT: Negative.   Eyes: Negative for visual disturbance.  Respiratory: Negative for shortness of breath.   Cardiovascular: Negative for chest pain.  Gastrointestinal: Positive for nausea and abdominal pain. Negative for vomiting.  Genitourinary: Positive for frequency and vaginal bleeding. Negative for dysuria and urgency.  Musculoskeletal: Negative for back pain.  Skin: Negative for rash.  Neurological: Negative for syncope and headaches.  Psychiatric/Behavioral: Negative for confusion. The patient is not nervous/anxious.       Allergies  Cinnamon; Vicodin; Bee venom; Dilaudid; and Toradol  Home Medications   Prior to Admission medications   Medication Sig Start Date End Date Taking? Authorizing Provider  citalopram (CELEXA) 20 MG tablet Take 20 mg by mouth daily.    Historical Provider, MD  gabapentin (NEURONTIN) 100 MG capsule Take 1 capsule (100 mg total) by mouth 3 (three) times daily. 2/70/35   Delora Fuel, MD  Multiple Vitamin (MULTIVITAMIN) tablet Take 1 tablet by mouth daily.    Historical Provider, MD   BP 155/84  Pulse 120  Temp(Src) 98.2 F (36.8 C) (Oral)  Ht 5\' 8"  (1.727 m)  Wt 200 lb (90.719 kg)  BMI 30.42 kg/m2  SpO2 100%  LMP 07/28/2014 Physical Exam  Nursing note and vitals reviewed. Constitutional: She is oriented to person, place, and  time. She appears well-developed and well-nourished. No distress.  HENT:  Head: Normocephalic.  Eyes: EOM are normal.  Neck: Neck supple.  Cardiovascular: Tachycardia present.   Pulmonary/Chest: Effort normal.  Abdominal: Soft. There is tenderness in the right lower quadrant and suprapubic area.  Genitourinary:  External genitalia without lesions. Small blood vaginal vault. Positive CMT, right adnexal tenderness.   Musculoskeletal: Normal range of motion.  Neurological: She is alert and oriented to person, place, and time. No cranial nerve deficit.  Skin: Skin  is warm and dry.  Psychiatric: She has a normal mood and affect. Her behavior is normal.   US Ob Comp Less 14 Wks  09/03/2014   CLINICAL DATA:  Patient with vaginal bleeding and recent positive pregnancy test.  EXAM: OBSTETRIC <14 WK [place] AND TRANSVAGINAL OB US  TECHNIQUE: Both transabdominal and transvaginal ultrasound examinations were performed for complete evaluation of the gestation as well as the maternal uterus, adnexal regions, and pelvic cul-de-sac. Transvaginal technique was performed to assess early pregnancy.  COMPARISON:  None.  FINDINGS: Intrauterine gestational sac: Not present  Yolk sac:  Not present  Embryo:  Not present  Cardiac Activity: Not present  Maternal uterus/adnexae: The ovaries are unremarkable. No adnexal mass identified. Small amount of free fluid. Endometrium is thickened measuring 15 mm.  IMPRESSION: In the setting of positive pregnancy test and no definite intrauterine pregnancy, this reflects a pregnancy of unknown location. Differential considerations include early normal IUP, abnormal IUP, or nonvisualized ectopic pregnancy. Differentiation is achieved with serial beta HCG supplemented by repeat sonography as clinically warranted.   Electronically Signed   By: Lovey Newcomer M.D.   On: 09/03/2014 00:15   US Ob Transvaginal  09/03/2014   CLINICAL DATA:  Patient with vaginal bleeding and recent positive pregnancy test.  EXAM: OBSTETRIC <14 WK [place] AND TRANSVAGINAL OB US  TECHNIQUE: Both transabdominal and transvaginal ultrasound examinations were performed for complete evaluation of the gestation as well as the maternal uterus, adnexal regions, and pelvic cul-de-sac. Transvaginal technique was performed to assess early pregnancy.  COMPARISON:  None.  FINDINGS: Intrauterine gestational sac: Not present  Yolk sac:  Not present  Embryo:  Not present  Cardiac Activity: Not present  Maternal uterus/adnexae: The ovaries are unremarkable. No adnexal mass identified. Small amount of free  fluid. Endometrium is thickened measuring 15 mm.  IMPRESSION: In the setting of positive pregnancy test and no definite intrauterine pregnancy, this reflects a pregnancy of unknown location. Differential considerations include early normal IUP, abnormal IUP, or nonvisualized ectopic pregnancy. Differentiation is achieved with serial beta HCG supplemented by repeat sonography as clinically warranted.   Electronically Signed   By: Lovey Newcomer M.D.   On: 09/03/2014 00:15    Results for orders placed during the hospital encounter of 09/02/14 (from the past 24 hour(s))  WET PREP, GENITAL     Status: Abnormal   Collection Time    09/02/14 10:21 PM      Result Value Ref Range   Yeast Wet Prep HPF POC NONE SEEN  NONE SEEN   Trich, Wet Prep NONE SEEN  NONE SEEN   Clue Cells Wet Prep HPF POC FEW (*) NONE SEEN   WBC, Wet Prep HPF POC FEW (*) NONE SEEN  CBC WITH DIFFERENTIAL     Status: Abnormal   Collection Time    09/02/14 10:29 PM      Result Value Ref Range   WBC 11.9 (*) 4.0 - 10.5 K/uL   RBC 5.15 (*)  3.87 - 5.11 MIL/uL   Hemoglobin 15.3 (*) 12.0 - 15.0 g/dL   HCT 44.6  36.0 - 46.0 %   MCV 86.6  78.0 - 100.0 fL   MCH 29.7  26.0 - 34.0 pg   MCHC 34.3  30.0 - 36.0 g/dL   RDW 14.5  11.5 - 15.5 %   Platelets 223  150 - 400 K/uL   Neutrophils Relative % 70  43 - 77 %   Neutro Abs 8.4 (*) 1.7 - 7.7 K/uL   Lymphocytes Relative 22  12 - 46 %   Lymphs Abs 2.6  0.7 - 4.0 K/uL   Monocytes Relative 7  3 - 12 %   Monocytes Absolute 0.8  0.1 - 1.0 K/uL   Eosinophils Relative 1  0 - 5 %   Eosinophils Absolute 0.1  0.0 - 0.7 K/uL   Basophils Relative 0  0 - 1 %   Basophils Absolute 0.0  0.0 - 0.1 K/uL  BASIC METABOLIC PANEL     Status: None   Collection Time    09/02/14 10:29 PM      Result Value Ref Range   Sodium 140  137 - 147 mEq/L   Potassium 3.7  3.7 - 5.3 mEq/L   Chloride 105  96 - 112 mEq/L   CO2 21  19 - 32 mEq/L   Glucose, Bld 99  70 - 99 mg/dL   BUN 6  6 - 23 mg/dL   Creatinine, Ser  0.71  0.50 - 1.10 mg/dL   Calcium 9.3  8.4 - 10.5 mg/dL   GFR calc non Af Amer >90  >90 mL/min   GFR calc Af Amer >90  >90 mL/min   Anion gap 14  5 - 15  HCG, QUANTITATIVE, PREGNANCY     Status: Abnormal   Collection Time    09/02/14 10:29 PM      Result Value Ref Range   hCG, Beta Chain, Quant, S 1353 (*) <5 mIU/mL  POC URINE PREG, ED     Status: Abnormal   Collection Time    09/02/14 10:34 PM      Result Value Ref Range   Preg Test, Ur POSITIVE (*) NEGATIVE    ED Course  Procedures IV fluids, Morphine 2 mg. IV and did help the pain.  Patient returned from ultrasound and has increased pain. I spoke with Dr. Roselie Awkward at [facility] and he will accept the patient there for admission.   MDM  33 y.o. female with abdominal pain and vaginal bleeding in early pregnancy. Stable for transfer to [facility] via [facility]. Discussed with the patient plan of care and she is agreeable to transfer.     [facility] Bunnie Pion, NP 09/03/14 0040

## 2014-09-03 ENCOUNTER — Encounter (HOSPITAL_COMMUNITY): Payer: Self-pay | Admitting: *Deleted

## 2014-09-03 ENCOUNTER — Inpatient Hospital Stay (EMERGENCY_DEPARTMENT_HOSPITAL)
Admission: AD | Admit: 2014-09-03 | Discharge: 2014-09-03 | Disposition: A | Discharge status: Home / Self Care | Payer: BC Managed Care – PPO | Source: Ambulatory Visit | Attending: Obstetrics & Gynecology | Admitting: Obstetrics & Gynecology

## 2014-09-03 DIAGNOSIS — R350 Frequency of micturition: Secondary | ICD-10-CM | POA: Diagnosis not present

## 2014-09-03 DIAGNOSIS — Z79899 Other long term (current) drug therapy: Secondary | ICD-10-CM | POA: Diagnosis not present

## 2014-09-03 DIAGNOSIS — Z3A01 Less than 8 weeks gestation of pregnancy: Secondary | ICD-10-CM | POA: Diagnosis not present

## 2014-09-03 DIAGNOSIS — Z9079 Acquired absence of other genital organ(s): Secondary | ICD-10-CM | POA: Diagnosis not present

## 2014-09-03 DIAGNOSIS — O209 Hemorrhage in early pregnancy, unspecified: Secondary | ICD-10-CM | POA: Diagnosis present

## 2014-09-03 DIAGNOSIS — F17210 Nicotine dependence, cigarettes, uncomplicated: Secondary | ICD-10-CM | POA: Diagnosis not present

## 2014-09-03 DIAGNOSIS — R109 Unspecified abdominal pain: Secondary | ICD-10-CM

## 2014-09-03 DIAGNOSIS — R000 Tachycardia, unspecified: Secondary | ICD-10-CM | POA: Diagnosis not present

## 2014-09-03 DIAGNOSIS — O9989 Other specified diseases and conditions complicating pregnancy, childbirth and the puerperium: Secondary | ICD-10-CM | POA: Diagnosis not present

## 2014-09-03 DIAGNOSIS — Z9889 Other specified postprocedural states: Secondary | ICD-10-CM | POA: Diagnosis not present

## 2014-09-03 DIAGNOSIS — O99331 Smoking (tobacco) complicating pregnancy, first trimester: Secondary | ICD-10-CM | POA: Diagnosis not present

## 2014-09-03 MED ORDER — OXYCODONE-ACETAMINOPHEN 5-325 MG PO TABS: 1.0000 | ORAL_TABLET | Freq: Four times a day (QID) | ORAL | Status: DC | PRN

## 2014-09-03 MED ORDER — MORPHINE SULFATE 2 MG/ML IJ SOLN
2.0000 mg | Freq: Once | INTRAMUSCULAR | Status: AC
  Administered 2014-09-03: 2 mg via INTRAVENOUS
  Filled 2014-09-03: qty 1

## 2014-09-03 NOTE — Progress Notes (Signed)
Dr Roselie Awkward in earlier to discuss d/c plan. Written and verbal d/c instructions given and understanding voiced

## 2014-09-03 NOTE — ED Provider Notes (Signed)
Medical screening examination/treatment/procedure(s) were performed by non-physician practitioner and as supervising physician I was immediately available for consultation/collaboration.     Veryl Speak, MD 09/03/14 [phone]

## 2014-09-03 NOTE — Discharge Instructions (Signed)
°Ectopic Pregnancy °An ectopic pregnancy is when the fertilized egg attaches (implants) outside the uterus. Most ectopic pregnancies occur in the fallopian tube. Rarely do ectopic pregnancies occur on the ovary, intestine, pelvis, or cervix. In an ectopic pregnancy, the fertilized egg does not have the ability to develop into a normal, healthy baby.  °A ruptured ectopic pregnancy is one in which the fallopian tube gets torn or bursts and results in internal bleeding. Often there is intense abdominal pain, and sometimes, vaginal bleeding. Having an ectopic pregnancy can be life threatening. If left untreated, this dangerous condition can lead to a blood transfusion, abdominal surgery, or even death. °CAUSES  °Damage to the fallopian tubes is the suspected cause in most ectopic pregnancies.  °RISK FACTORS °Depending on your circumstances, the risk of having an ectopic pregnancy will vary. The level of risk can be divided into three categories. °High Risk °· You have gone through infertility treatment. °· You have had a previous ectopic pregnancy. °· You have had previous tubal surgery. °· You have had previous surgery to have the fallopian tubes tied (tubal ligation). °· You have tubal problems or diseases. °· You have been exposed to DES. DES is a medicine that was used until 1971 and had effects on babies whose mothers took the medicine. °· You become pregnant while using an intrauterine device (IUD) for birth control.  °Moderate Risk °· You have a history of infertility. °· You have a history of a sexually transmitted infection (STI). °· You have a history of pelvic inflammatory disease (PID). °· You have scarring from endometriosis. °· You have multiple sexual partners. °· You smoke.  °Low Risk °· You have had previous pelvic surgery. °· You use vaginal douching. °· You became sexually active before 31 years of age. °SIGNS AND SYMPTOMS  °An ectopic pregnancy should be suspected in anyone who has missed a period  and has abdominal pain or bleeding. °· You may experience normal pregnancy symptoms, such as: °¨ Nausea. °¨ Tiredness. °¨ Breast tenderness. °· Other symptoms may include: °¨ Pain with intercourse. °¨ Irregular vaginal bleeding or spotting. °¨ Cramping or pain on one side or in the lower abdomen. °¨ Fast heartbeat. °¨ Passing out while having a bowel movement. °· Symptoms of a ruptured ectopic pregnancy and internal bleeding may include: °¨ Sudden, severe pain in the abdomen and pelvis. °¨ Dizziness or fainting. °¨ Pain in the shoulder area. °DIAGNOSIS  °Tests that may be performed include: °· A pregnancy test. °· An ultrasound test. °· Testing the specific level of pregnancy hormone in the bloodstream. °· Taking a sample of uterus tissue (dilation and curettage, D&C). °· Surgery to perform a visual exam of the inside of the abdomen using a thin, lighted tube with a tiny camera on the end (laparoscope). °TREATMENT  °An injection of a medicine called methotrexate may be given. This medicine causes the pregnancy tissue to be absorbed. It is given if: °· The diagnosis is made early. °· The fallopian tube has not ruptured. °· You are considered to be a good candidate for the medicine. °Usually, pregnancy hormone blood levels are checked after methotrexate treatment. This is to be sure the medicine is effective. It may take 4-6 weeks for the pregnancy to be absorbed (though most pregnancies will be absorbed by 3 weeks). °Surgical treatment may be needed. A laparoscope may be used to remove the pregnancy tissue. If severe internal bleeding occurs, a cut (incision) may be made in the lower abdomen (laparotomy), and the ectopic   pregnancy is removed. This stops the bleeding. Part of the fallopian tube, or the whole tube, may be removed as well (salpingectomy). After surgery, pregnancy hormone tests may be done to be sure there is no pregnancy tissue left. You may receive a Rho (D) immune globulin shot if you are Rh negative  and the father is Rh positive, or if you do not know the Rh type of the father. This is to prevent problems with any future pregnancy. °SEEK IMMEDIATE MEDICAL CARE IF:  °You have any symptoms of an ectopic pregnancy. This is a medical emergency. °MAKE SURE YOU: °· Understand these instructions. °· Will watch your condition. °· Will get help right away if you are not doing well or get worse. °Document Released: 12/26/2004 Document Revised: 04/04/2014 Document Reviewed: 06/17/2013 °ExitCare® Patient Information ©2015 [employer]. This information is not intended to replace advice given to you by your health care provider. Make sure you discuss any questions you have with your health care provider. ° ° °

## 2014-09-03 NOTE — MAU Provider Note (Signed)
History     CSN: [id number]  Arrival date and time: 09/03/14 0210   None     Chief Complaint  Patient presents with  . Abdominal Pain   HPIG10P2010 Patient's last menstrual period was 07/28/2014. [redacted]w[redacted]d 4 days of pain that became worse 10/2 and she presented to [facility]. Concern for her level of pain and tenderness and need to be seen by Gyn led to her transfer to [facility] for evaluation. States pain 6/10 now after receiving morphine  Pertinent Gynecological History: Menses: flow is moderate Bleeding: spotting Contraception: none  Blood transfusions: none Sexually transmitted diseases: no past history Previous GYN Procedures: laparoscopy and left salpingectomy 2013 ectopic     Past Medical History  Diagnosis Date  . Pregnant   . Miscarriage     pt. states shes  spotting  and wearing a tampon    Past Surgical History  Procedure Laterality Date  . Appendectomy    . Cesarean section    . Cholecystectomy    . Cholecystectomy  07/28/2012    Procedure: LAPAROSCOPIC CHOLECYSTECTOMY;  Surgeon: Donato Heinz, MD;  Location: [facility];  Service: General;  Laterality: N/A;  . Removal of lt fallopian tube      History reviewed. No pertinent family history.  History  Substance Use Topics  . Smoking status: Current Every Day Smoker  . Smokeless tobacco: Not on file  . Alcohol Use: Yes    Allergies:  Allergies  Allergen Reactions  . Cinnamon Swelling and Other (See Comments)    REACTION: throat swells  . Vicodin [Hydrocodone-Acetaminophen] Nausea And Vomiting  . Bee Venom Swelling and Other (See Comments)    REACTION: All over body swelling  . Dilaudid [Hydromorphone Hcl] Hives  . Toradol [Ketorolac Tromethamine]     Hives, and chest pain    Prescriptions prior to admission  Medication Sig Dispense Refill  . acetaminophen (TYLENOL) 500 MG tablet Take 1,000 mg by mouth every 6 (six) hours as needed.      . Polyethylene Glycol 400 (BLINK TEARS) 0.25 % SOLN Apply to eye.       . citalopram (CELEXA) 20 MG tablet Take 20 mg by mouth daily.      Marland Kitchen gabapentin (NEURONTIN) 100 MG capsule Take 1 capsule (100 mg total) by mouth 3 (three) times daily.  60 capsule  0  . Multiple Vitamin (MULTIVITAMIN) tablet Take 1 tablet by mouth daily.        Review of Systems  Constitutional: Negative.   Cardiovascular: Negative.   Gastrointestinal: Negative.   Genitourinary:       Right LQ pain   Physical Exam   Blood pressure 108/61, pulse 83, temperature 98.2 F (36.8 C), temperature source Oral, resp. rate 18, last menstrual period 07/28/2014.  Physical Exam  Constitutional: She is oriented to person, place, and time. She appears well-developed. No distress.  Cardiovascular: Normal rate.   Respiratory: Effort normal. No respiratory distress.  GI: Soft. She exhibits no mass. There is tenderness (minimal RLQ). There is no rebound and no guarding.  Genitourinary: Vagina normal.  Light bleeding minimal CMT and mild to moderate right side tenderness no mass  Neurological: She is alert and oriented to person, place, and time.  Skin: Skin is warm and dry.  Psychiatric: She has a normal mood and affect. Her behavior is normal.   CLINICAL DATA: Patient with vaginal bleeding and recent positive  pregnancy test.  EXAM:  OBSTETRIC <14 WK [place] AND TRANSVAGINAL OB US  TECHNIQUE:  Both transabdominal and transvaginal ultrasound examinations were  performed for complete evaluation of the gestation as well as the  maternal uterus, adnexal regions, and pelvic cul-de-sac.  Transvaginal technique was performed to assess early pregnancy.  COMPARISON: None.  FINDINGS:  Intrauterine gestational sac: Not present  Yolk sac: Not present  Embryo: Not present  Cardiac Activity: Not present  Maternal uterus/adnexae: The ovaries are unremarkable. No adnexal  mass identified. Small amount of free fluid. Endometrium is  thickened measuring 15 mm.  IMPRESSION:  In the setting of positive  pregnancy test and no definite  intrauterine pregnancy, this reflects a pregnancy of unknown  location. Differential considerations include early normal IUP,  abnormal IUP, or nonvisualized ectopic pregnancy. Differentiation is  achieved with serial beta HCG supplemented by repeat sonography as  clinically warranted.  Electronically Signed  By: Lovey Newcomer M.D.  On: 09/03/2014 00:15  Results for orders placed during the hospital encounter of 09/02/14 (from the past 24 hour(s))  WET PREP, GENITAL     Status: Abnormal   Collection Time    09/02/14 10:21 PM      Result Value Ref Range   Yeast Wet Prep HPF POC NONE SEEN  NONE SEEN   Trich, Wet Prep NONE SEEN  NONE SEEN   Clue Cells Wet Prep HPF POC FEW (*) NONE SEEN   WBC, Wet Prep HPF POC FEW (*) NONE SEEN  CBC WITH DIFFERENTIAL     Status: Abnormal   Collection Time    09/02/14 10:29 PM      Result Value Ref Range   WBC 11.9 (*) 4.0 - 10.5 K/uL   RBC 5.15 (*) 3.87 - 5.11 MIL/uL   Hemoglobin 15.3 (*) 12.0 - 15.0 g/dL   HCT 44.6  36.0 - 46.0 %   MCV 86.6  78.0 - 100.0 fL   MCH 29.7  26.0 - 34.0 pg   MCHC 34.3  30.0 - 36.0 g/dL   RDW 14.5  11.5 - 15.5 %   Platelets 223  150 - 400 K/uL   Neutrophils Relative % 70  43 - 77 %   Neutro Abs 8.4 (*) 1.7 - 7.7 K/uL   Lymphocytes Relative 22  12 - 46 %   Lymphs Abs 2.6  0.7 - 4.0 K/uL   Monocytes Relative 7  3 - 12 %   Monocytes Absolute 0.8  0.1 - 1.0 K/uL   Eosinophils Relative 1  0 - 5 %   Eosinophils Absolute 0.1  0.0 - 0.7 K/uL   Basophils Relative 0  0 - 1 %   Basophils Absolute 0.0  0.0 - 0.1 K/uL  BASIC METABOLIC PANEL     Status: None   Collection Time    09/02/14 10:29 PM      Result Value Ref Range   Sodium 140  137 - 147 mEq/L   Potassium 3.7  3.7 - 5.3 mEq/L   Chloride 105  96 - 112 mEq/L   CO2 21  19 - 32 mEq/L   Glucose, Bld 99  70 - 99 mg/dL   BUN 6  6 - 23 mg/dL   Creatinine, Ser 0.71  0.50 - 1.10 mg/dL   Calcium 9.3  8.4 - 10.5 mg/dL   GFR calc non Af Amer >90   >90 mL/min   GFR calc Af Amer >90  >90 mL/min   Anion gap 14  5 - 15  HCG, QUANTITATIVE, PREGNANCY     Status: Abnormal   Collection Time  09/02/14 10:29 PM      Result Value Ref Range   hCG, Beta Chain, Quant, S 1353 (*) <5 mIU/mL  POC URINE PREG, ED     Status: Abnormal   Collection Time    09/02/14 10:34 PM      Result Value Ref Range   Preg Test, Ur POSITIVE (*) NEGATIVE    MAU Course  Procedures  MDM Moderate, reviewed [place] images and report  Assessment and Plan  Possible ectopic pregnancy doubt rupture based on exam and [place]. Will provide some pain relief and and D/c to f/u 10/4 HCG in MAU. Ectopic precautions given  ARNOLD,JAMES 09/03/2014, 2:55 AM

## 2014-09-03 NOTE — Progress Notes (Signed)
Dr Roselie Awkward notified of pt's admission and status. Will see pt

## 2014-09-04 ENCOUNTER — Encounter (HOSPITAL_COMMUNITY): Payer: Self-pay

## 2014-09-04 ENCOUNTER — Inpatient Hospital Stay (HOSPITAL_COMMUNITY)
Admission: AD | Admit: 2014-09-04 | Discharge: 2014-09-05 | Disposition: A | Discharge status: Home / Self Care | Payer: BC Managed Care – PPO | Source: Ambulatory Visit | Attending: Obstetrics and Gynecology | Admitting: Obstetrics and Gynecology

## 2014-09-04 DIAGNOSIS — O009 Unspecified ectopic pregnancy without intrauterine pregnancy: Secondary | ICD-10-CM

## 2014-09-04 DIAGNOSIS — F17200 Nicotine dependence, unspecified, uncomplicated: Secondary | ICD-10-CM | POA: Diagnosis not present

## 2014-09-04 DIAGNOSIS — R1031 Right lower quadrant pain: Secondary | ICD-10-CM | POA: Diagnosis present

## 2014-09-04 DIAGNOSIS — O001 Tubal pregnancy: Secondary | ICD-10-CM | POA: Insufficient documentation

## 2014-09-04 LAB — CBC WITH DIFFERENTIAL/PLATELET
Basophils Absolute: 0 10*3/uL (ref 0.0–0.1)
Basophils Relative: 0 % (ref 0–1)
EOS ABS: 0.1 10*3/uL (ref 0.0–0.7)
EOS PCT: 1 % (ref 0–5)
HCT: 41.5 % (ref 36.0–46.0)
Hemoglobin: 13.7 g/dL (ref 12.0–15.0)
LYMPHS ABS: 2.5 10*3/uL (ref 0.7–4.0)
Lymphocytes Relative: 26 % (ref 12–46)
MCH: 29.5 pg (ref 26.0–34.0)
MCHC: 33 g/dL (ref 30.0–36.0)
MCV: 89.4 fL (ref 78.0–100.0)
MONO ABS: 0.6 10*3/uL (ref 0.1–1.0)
MONOS PCT: 6 % (ref 3–12)
Neutro Abs: 6.4 10*3/uL (ref 1.7–7.7)
Neutrophils Relative %: 67 % (ref 43–77)
PLATELETS: 198 10*3/uL (ref 150–400)
RBC: 4.64 MIL/uL (ref 3.87–5.11)
RDW: 14.9 % (ref 11.5–15.5)
WBC: 9.6 10*3/uL (ref 4.0–10.5)

## 2014-09-04 LAB — CREATININE, SERUM
Creatinine, Ser: 0.83 mg/dL (ref 0.50–1.10)
GFR calc non Af Amer: >90 mL/min (ref >90)

## 2014-09-04 LAB — BUN: BUN: 10 mg/dL (ref 6–23)

## 2014-09-04 LAB — AST: AST: 43 U/L — AB (ref 0–37)

## 2014-09-04 LAB — HCG, QUANTITATIVE, PREGNANCY: hCG, Beta Chain, Quant, S: 1625 m[IU]/mL — ABNORMAL HIGH (ref <5)

## 2014-09-04 MED ORDER — METHOTREXATE INJECTION FOR WOMEN'S HOSPITAL
50.0000 mg/m2 | Freq: Once | INTRAMUSCULAR | Status: AC
  Administered 2014-09-04: 110 mg via INTRAMUSCULAR
  Filled 2014-09-04: qty 2.2

## 2014-09-04 MED ORDER — PROMETHAZINE HCL 25 MG PO TABS
25.0000 mg | ORAL_TABLET | Freq: Once | ORAL | Status: AC
  Administered 2014-09-04: 25 mg via ORAL
  Filled 2014-09-04: qty 1

## 2014-09-04 NOTE — MAU Note (Signed)
Pt not in lobby.  

## 2014-09-04 NOTE — Discharge Instructions (Signed)
Ectopic Pregnancy °An ectopic pregnancy is when the fertilized egg attaches (implants) outside the uterus. Most ectopic pregnancies occur in the fallopian tube. Rarely do ectopic pregnancies occur on the ovary, intestine, pelvis, or cervix. In an ectopic pregnancy, the fertilized egg does not have the ability to develop into a normal, healthy baby.  °A ruptured ectopic pregnancy is one in which the fallopian tube gets torn or bursts and results in internal bleeding. Often there is intense abdominal pain, and sometimes, vaginal bleeding. Having an ectopic pregnancy can be life threatening. If left untreated, this dangerous condition can lead to a blood transfusion, abdominal surgery, or even death. °CAUSES  °Damage to the fallopian tubes is the suspected cause in most ectopic pregnancies.  °RISK FACTORS °Depending on your circumstances, the risk of having an ectopic pregnancy will vary. The level of risk can be divided into three categories. °High Risk °· You have gone through infertility treatment. °· You have had a previous ectopic pregnancy. °· You have had previous tubal surgery. °· You have had previous surgery to have the fallopian tubes tied (tubal ligation). °· You have tubal problems or diseases. °· You have been exposed to DES. DES is a medicine that was used until 1971 and had effects on babies whose mothers took the medicine. °· You become pregnant while using an intrauterine device (IUD) for birth control.  °Moderate Risk °· You have a history of infertility. °· You have a history of a sexually transmitted infection (STI). °· You have a history of pelvic inflammatory disease (PID). °· You have scarring from endometriosis. °· You have multiple sexual partners. °· You smoke.  °Low Risk °· You have had previous pelvic surgery. °· You use vaginal douching. °· You became sexually active before 31 years of age. °SIGNS AND SYMPTOMS  °An ectopic pregnancy should be suspected in anyone who has missed a period and  has abdominal pain or bleeding. °· You may experience normal pregnancy symptoms, such as: °¨ Nausea. °¨ Tiredness. °¨ Breast tenderness. °· Other symptoms may include: °¨ Pain with intercourse. °¨ Irregular vaginal bleeding or spotting. °¨ Cramping or pain on one side or in the lower abdomen. °¨ Fast heartbeat. °¨ Passing out while having a bowel movement. °· Symptoms of a ruptured ectopic pregnancy and internal bleeding may include: °¨ Sudden, severe pain in the abdomen and pelvis. °¨ Dizziness or fainting. °¨ Pain in the shoulder area. °DIAGNOSIS  °Tests that may be performed include: °· A pregnancy test. °· An ultrasound test. °· Testing the specific level of pregnancy hormone in the bloodstream. °· Taking a sample of uterus tissue (dilation and curettage, D&C). °· Surgery to perform a visual exam of the inside of the abdomen using a thin, lighted tube with a tiny camera on the end (laparoscope). °TREATMENT  °An injection of a medicine called methotrexate may be given. This medicine causes the pregnancy tissue to be absorbed. It is given if: °· The diagnosis is made early. °· The fallopian tube has not ruptured. °· You are considered to be a good candidate for the medicine. °Usually, pregnancy hormone blood levels are checked after methotrexate treatment. This is to be sure the medicine is effective. It may take 4-6 weeks for the pregnancy to be absorbed (though most pregnancies will be absorbed by 3 weeks). °Surgical treatment may be needed. A laparoscope may be used to remove the pregnancy tissue. If severe internal bleeding occurs, a cut (incision) may be made in the lower abdomen (laparotomy), and the ectopic   pregnancy is removed. This stops the bleeding. Part of the fallopian tube, or the whole tube, may be removed as well (salpingectomy). After surgery, pregnancy hormone tests may be done to be sure there is no pregnancy tissue left. You may receive a Rho (D) immune globulin shot if you are Rh negative and  the father is Rh positive, or if you do not know the Rh type of the father. This is to prevent problems with any future pregnancy. °SEEK IMMEDIATE MEDICAL CARE IF:  °You have any symptoms of an ectopic pregnancy. This is a medical emergency. °MAKE SURE YOU: °· Understand these instructions. °· Will watch your condition. °· Will get help right away if you are not doing well or get worse. °Document Released: 12/26/2004 Document Revised: 04/04/2014 Document Reviewed: 06/17/2013 °ExitCare® Patient Information ©2015 [employer]. This information is not intended to replace advice given to you by your health care provider. Make sure you discuss any questions you have with your health care provider. °Methotrexate Treatment for an Ectopic Pregnancy °Methotrexate is a medicine that treats ectopic pregnancy by stopping the growth of the fertilized egg. It also helps your body absorb tissue from the egg. This takes between 2 weeks and 6 weeks. Most ectopic pregnancies can be successfully treated with methotrexate if they are detected early enough. °LET YOUR HEALTH CARE PROVIDER KNOW ABOUT: °· Any allergies you have. °· All medicines you are taking, including vitamins, herbs, eye drops, creams, and over-the-counter medicines. °· Medical conditions you have. °RISKS AND COMPLICATIONS °Generally, this is a safe treatment. However, as with any treatment, problems can occur. Possible problems or side effects include: °· Nausea. °· Vomiting. °· Diarrhea. °· Abdominal cramping. °· Mouth sores. °· Increased vaginal bleeding or spotting.   °· Swelling or irritation of the lining of your lungs (pneumonitis).  °· Failed treatment and continuation of the pregnancy.   °· Liver damage. °· Hair loss. °There is still a risk of the ectopic pregnancy rupturing while using the methotrexate. °BEFORE THE PROCEDURE °Before you take the medicine:  °· Liver tests, kidney tests, and a complete blood test are performed. °· Blood tests are performed to  measure the pregnancy hormone levels and to determine your blood type. °· If you are Rh-negative and the father is Rh-positive or his Rh type is not known, you will be given a Rho (D) immune globulin shot. °PROCEDURE  °There are two methods that your health care provider may use to prescribe methotrexate. One method involves a single dose or injection of the medicine. Another method involves a series of doses given through several injections.  °AFTER THE PROCEDURE °· You may have some abdominal cramping, vaginal bleeding, and fatigue in the first few days after taking methotrexate. °· Blood tests will be taken for several weeks to check the pregnancy hormone levels. The blood tests are performed until there is no more pregnancy hormone detected in the blood. °Document Released: 11/12/2001 Document Revised: 04/04/2014 Document Reviewed: 09/06/2013 °ExitCare® Patient Information ©2015 [employer]. This information is not intended to replace advice given to you by your health care provider. Make sure you discuss any questions you have with your health care provider. ° °

## 2014-09-04 NOTE — MAU Provider Note (Signed)
History     CSN: [id number]  Arrival date and time: 09/04/14 1909   None     Chief Complaint  Patient presents with  . Labs Only    f/u Bhcg   HPI  Gwendolyn Glass is a 33 y.o. G10P2010 at [redacted]w[redacted]d who presents today for FU HCG. She was seen two days ago with concern for ectopic pregnancy. She states that she has continued RLQ pain, and it has not increased or decreased from her last visit. She has also had light spotting.   Past Medical History  Diagnosis Date  . Pregnant   . Miscarriage     pt. states shes  spotting  and wearing a tampon    Past Surgical History  Procedure Laterality Date  . Appendectomy    . Cesarean section    . Cholecystectomy    . Cholecystectomy  07/28/2012    Procedure: LAPAROSCOPIC CHOLECYSTECTOMY;  Surgeon: Donato Heinz, MD;  Location: [facility];  Service: General;  Laterality: N/A;  . Removal of lt fallopian tube      No family history on file.  History  Substance Use Topics  . Smoking status: Current Every Day Smoker  . Smokeless tobacco: Not on file  . Alcohol Use: Yes    Allergies:  Allergies  Allergen Reactions  . Cinnamon Swelling and Other (See Comments)    REACTION: throat swells  . Vicodin [Hydrocodone-Acetaminophen] Nausea And Vomiting  . Bee Venom Swelling and Other (See Comments)    REACTION: All over body swelling  . Dilaudid [Hydromorphone Hcl] Hives  . Toradol [Ketorolac Tromethamine]     Hives, and chest pain    Prescriptions prior to admission  Medication Sig Dispense Refill  . acetaminophen (TYLENOL) 500 MG tablet Take 1,000 mg by mouth every 6 (six) hours as needed.      . citalopram (CELEXA) 20 MG tablet Take 20 mg by mouth daily.      Marland Kitchen gabapentin (NEURONTIN) 100 MG capsule Take 1 capsule (100 mg total) by mouth 3 (three) times daily.  60 capsule  0  . Multiple Vitamin (MULTIVITAMIN) tablet Take 1 tablet by mouth daily.      Marland Kitchen oxyCODONE-acetaminophen (PERCOCET/ROXICET) 5-325 MG per tablet Take 1-2 tablets by  mouth every 6 (six) hours as needed.  10 tablet  0  . Polyethylene Glycol 400 (BLINK TEARS) 0.25 % SOLN Apply to eye.        ROS Physical Exam   Blood pressure 141/84, pulse 97, resp. rate 18, last menstrual period 07/28/2014, SpO2 100.00%.  Physical Exam  MAU Course  Procedures  Results for Gwendolyn Glass, Gwendolyn Glass (MRN [id number]) as of 09/04/2014 20:11  Ref. Range 09/02/2014 22:29 09/02/2014 22:34 09/02/2014 23:57 09/04/2014 19:15  hCG, Beta Chain, Quant, S Latest Range: <5 mIU/mL 1353 (H)   1625 (H)   Results for orders placed during the hospital encounter of 09/04/14 (from the past 24 hour(s))  HCG, QUANTITATIVE, PREGNANCY     Status: Abnormal   Collection Time    09/04/14  7:15 PM      Result Value Ref Range   hCG, Beta Chain, Quant, S 1625 (*) <5 mIU/mL  CBC WITH DIFFERENTIAL     Status: None   Collection Time    09/04/14  7:15 PM      Result Value Ref Range   WBC 9.6  4.0 - 10.5 K/uL   RBC 4.64  3.87 - 5.11 MIL/uL   Hemoglobin 13.7  12.0 - 15.0 g/dL  HCT 41.5  36.0 - 46.0 %   MCV 89.4  78.0 - 100.0 fL   MCH 29.5  26.0 - 34.0 pg   MCHC 33.0  30.0 - 36.0 g/dL   RDW 14.9  11.5 - 15.5 %   Platelets 198  150 - 400 K/uL   Neutrophils Relative % 67  43 - 77 %   Neutro Abs 6.4  1.7 - 7.7 K/uL   Lymphocytes Relative 26  12 - 46 %   Lymphs Abs 2.5  0.7 - 4.0 K/uL   Monocytes Relative 6  3 - 12 %   Monocytes Absolute 0.6  0.1 - 1.0 K/uL   Eosinophils Relative 1  0 - 5 %   Eosinophils Absolute 0.1  0.0 - 0.7 K/uL   Basophils Relative 0  0 - 1 %   Basophils Absolute 0.0  0.0 - 0.1 K/uL  AST     Status: Abnormal   Collection Time    09/04/14  7:15 PM      Result Value Ref Range   AST 43 (*) 0 - 37 U/L  BUN     Status: None   Collection Time    09/04/14  7:15 PM      Result Value Ref Range   BUN 10  6 - 23 mg/dL  CREATININE, SERUM     Status: None   Collection Time    09/04/14  7:15 PM      Result Value Ref Range   Creatinine, Ser 0.83  0.50 - 1.10 mg/dL   GFR calc non Af  Amer >90  >90 mL/min   GFR calc Af Amer >90  >90 mL/min    2225: D/W Dr. Elly Modena, will offer patient MTX 2230: Patient desires to proceed with MTX R/B/A discussed  2301: Reviewed elevated AST with Dr. Elly Modena, ok to give MTX today. Will repeat LFTs on day 4.   Assessment and Plan   1. Ectopic pregnancy    Precautions reviewed Return to MAU as needed Plan to repeat LFTs on Day #4  Follow-up Information   Follow up with [facility]. (Wednesday 09/07/14 )    Contact information:   [address] [id number] [place] [address] [phone]       Mathis Bud 09/04/2014, 8:12 PM

## 2014-09-04 NOTE — MAU Note (Signed)
RLQ pain; stable since last visit; comes & goes. Vaginal bleeding, states same as last visit. Came in tonight for follow up HCG

## 2014-09-05 ENCOUNTER — Encounter (HOSPITAL_COMMUNITY): Payer: Self-pay

## 2014-09-05 LAB — GC/CHLAMYDIA PROBE AMP
CT PROBE, AMP APTIMA: NEGATIVE
GC Probe RNA: NEGATIVE

## 2014-09-05 NOTE — MAU Provider Note (Signed)
Attestation of Attending Supervision of Advanced Practitioner (CNM/NP): Evaluation and management procedures were performed by the Advanced Practitioner under my supervision and collaboration.  I have reviewed the Advanced Practitioner's note and chart, and I agree with the management and plan.  Yulitza Shorts 09/05/2014 3:00 AM

## 2014-09-06 ENCOUNTER — Ambulatory Visit (HOSPITAL_COMMUNITY)
Admission: AD | Admit: 2014-09-06 | Discharge: 2014-09-07 | Disposition: A | Discharge status: Home / Self Care | Payer: BC Managed Care – PPO | Source: Ambulatory Visit | Attending: Obstetrics & Gynecology | Admitting: Obstetrics & Gynecology

## 2014-09-06 ENCOUNTER — Inpatient Hospital Stay (HOSPITAL_COMMUNITY): Payer: BC Managed Care – PPO

## 2014-09-06 ENCOUNTER — Encounter (HOSPITAL_COMMUNITY): Payer: Self-pay | Admitting: *Deleted

## 2014-09-06 DIAGNOSIS — O001 Tubal pregnancy: Secondary | ICD-10-CM | POA: Diagnosis not present

## 2014-09-06 DIAGNOSIS — F17200 Nicotine dependence, unspecified, uncomplicated: Secondary | ICD-10-CM | POA: Insufficient documentation

## 2014-09-06 DIAGNOSIS — O00109 Unspecified tubal pregnancy without intrauterine pregnancy: Secondary | ICD-10-CM

## 2014-09-06 LAB — CBC
HCT: 40.8 % (ref 36.0–46.0)
Hemoglobin: 13.9 g/dL (ref 12.0–15.0)
MCH: 30 pg (ref 26.0–34.0)
MCHC: 34.1 g/dL (ref 30.0–36.0)
MCV: 88.1 fL (ref 78.0–100.0)
Platelets: 170 10*3/uL (ref 150–400)
RBC: 4.63 MIL/uL (ref 3.87–5.11)
RDW: 14.7 % (ref 11.5–15.5)
WBC: 9.8 10*3/uL (ref 4.0–10.5)

## 2014-09-06 LAB — HCG, QUANTITATIVE, PREGNANCY: HCG, BETA CHAIN, QUANT, S: 1962 m[IU]/mL — AB (ref <5)

## 2014-09-06 MED ORDER — PROMETHAZINE HCL 25 MG/ML IJ SOLN
12.5000 mg | Freq: Once | INTRAMUSCULAR | Status: AC
  Administered 2014-09-06: 12.5 mg via INTRAVENOUS
  Filled 2014-09-06: qty 1

## 2014-09-06 MED ORDER — MORPHINE SULFATE 4 MG/ML IJ SOLN
1.0000 mg | Freq: Once | INTRAMUSCULAR | Status: DC
  Filled 2014-09-06: qty 1

## 2014-09-06 MED ORDER — SODIUM CHLORIDE 0.9 % IV SOLN
INTRAVENOUS | Status: DC
  Administered 2014-09-06: 20:00:00 via INTRAVENOUS

## 2014-09-06 MED ORDER — MORPHINE SULFATE 4 MG/ML IJ SOLN: 2.0000 mg | Freq: Once | INTRAMUSCULAR | Status: DC

## 2014-09-06 MED ORDER — CITRIC ACID-SODIUM CITRATE 334-500 MG/5ML PO SOLN
ORAL | Status: AC
  Administered 2014-09-07: 30 mL
  Filled 2014-09-06: qty 15

## 2014-09-06 MED ORDER — MORPHINE SULFATE 4 MG/ML IJ SOLN
2.0000 mg | Freq: Once | INTRAMUSCULAR | Status: AC
  Administered 2014-09-06: 2 mg via INTRAVENOUS

## 2014-09-06 MED ORDER — MORPHINE SULFATE 4 MG/ML IJ SOLN: 5.0000 mg | Freq: Once | INTRAMUSCULAR | Status: DC

## 2014-09-06 NOTE — H&P (Addendum)
History    CSN: [id number]  Arrival date and time: 09/06/14 1851  None  Chief Complaint   Patient presents with   .  Abdominal Pain    Patient is a 33 y.o. female presenting with abdominal pain.  Abdominal Pain  The primary symptoms of the illness include abdominal pain and nausea.   Gwendolyn Glass is a 33 y.o. [phone] @ [redacted]w[redacted]d gestation who presents to the MAU with abdominal pain. She was evaluated @ [facility] ED 09/02/14 for bleeding and pain in early pregnancy and transferred to MAU. She followed up again 10/4 for Bhcg and her pain was less at that time. Today she returns due to severe pain in the RLQ. She has a history of ectopic pregnancy on the left that required surgery.  Bhcg on 10/2 was 1353 and on 10/4 was 1625. Patient received MTX on 10/4 for presumed ectopic pregnancy with abnormal rise in Bhcg. Patient was scheduled to return day 4 after MTX but states that the pain became severe and she came back tonight.  OB History    Grav  Para  Term  Preterm  Abortions  TAB  SAB  Ect  Mult  Living    10  2  2   7   6  1         Past Medical History   Diagnosis  Date   .  Pregnant    .  Miscarriage      pt. states shes spotting and wearing a tampon    Past Surgical History   Procedure  Laterality  Date   .  Appendectomy     .  Cesarean section     .  Cholecystectomy     .  Cholecystectomy   07/28/2012     Procedure: LAPAROSCOPIC CHOLECYSTECTOMY; Surgeon: Donato Heinz, MD; Location: [facility]; Service: General; Laterality: N/A;   .  Removal of lt fallopian tube      No family history on file.  History   Substance Use Topics   .  Smoking status:  Current Every Day Smoker   .  Smokeless tobacco:  Not on file   .  Alcohol Use:  Yes    Allergies:  Allergies   Allergen  Reactions   .  Cinnamon  Swelling and Other (See Comments)     REACTION: throat swells   .  Vicodin [Hydrocodone-Acetaminophen]  Nausea And Vomiting   .  Bee Venom  Swelling and Other (See Comments)      REACTION: All over body swelling   .  Dilaudid [Hydromorphone Hcl]  Hives   .  Toradol [Ketorolac Tromethamine]      Hives, and chest pain    Prescriptions prior to admission   Medication  Sig  Dispense  Refill   .  acetaminophen (TYLENOL) 500 MG tablet  Take 1,000 mg by mouth every 6 (six) hours as needed.     .  citalopram (CELEXA) 20 MG tablet  Take 20 mg by mouth daily.     Marland Kitchen  gabapentin (NEURONTIN) 100 MG capsule  Take 1 capsule (100 mg total) by mouth 3 (three) times daily.  60 capsule  0   .  Multiple Vitamin (MULTIVITAMIN) tablet  Take 1 tablet by mouth daily.     Marland Kitchen  oxyCODONE-acetaminophen (PERCOCET/ROXICET) 5-325 MG per tablet  Take 1-2 tablets by mouth every 6 (six) hours as needed.  10 tablet  0   .  Polyethylene Glycol 400 (BLINK  TEARS) 0.25 % SOLN  Apply to eye.      Review of Systems  Gastrointestinal: Positive for nausea and abdominal pain.  All other systems negative  Physical Exam   Blood pressure 145/79, pulse 123, temperature 98.9 F (37.2 C), temperature source Oral, resp. rate 20, height 5\' 8"  (1.727 m), weight 219 lb (99.338 kg), last menstrual period 07/28/2014.  Physical Exam  Constitutional: She is oriented to person, place, and time. She appears well-developed and well-nourished. No distress.  HENT:  Head: Normocephalic and atraumatic.  Eyes: EOM are normal.  Neck: Neck supple.  Cardiovascular: Normal rate.  Respiratory: Effort normal.  GI: Soft. There is tenderness in the right lower quadrant.  Genitourinary: Vagina normal.  Musculoskeletal: Normal range of motion.  Neurological: She is alert and oriented to person, place, and time.  Skin: Skin is warm and dry.  Psychiatric: She has a normal mood and affect. Her behavior is normal. Judgment and thought content normal.   Results for orders placed during the hospital encounter of 09/06/14 (from the past 24 hour(s))   HCG, QUANTITATIVE, PREGNANCY Status: Abnormal    Collection Time    09/06/14 7:20 PM    Result  Value  Ref Range    hCG, Beta Chain, Quant, S  1962 (*)  <5 mIU/mL   CBC Status: None    Collection Time    09/06/14 7:20 PM   Result  Value  Ref Range    WBC  9.8  4.0 - 10.5 K/uL    RBC  4.63  3.87 - 5.11 MIL/uL    Hemoglobin  13.9  12.0 - 15.0 g/dL    HCT  40.8  36.0 - 46.0 %    MCV  88.1  78.0 - 100.0 fL    MCH  30.0  26.0 - 34.0 pg    MCHC  34.1  30.0 - 36.0 g/dL    RDW  14.7  11.5 - 15.5 %    Platelets  170  150 - 400 K/uL    MAU Course   Procedures  Labs, ultrasound, IV NS, Phenergan 12.5 mg IV, Morphine 4 mg IV  MDM  Assessment and Plan   33 y.o. female with right lower quadrant pain that is severe. Called report from ultrasound that patient has a living right ectopic pregnancy.  Dr. Roselie Awkward notified and he will see the patient in MAU.  NEESE,HOPE  09/06/2014, 7:21 PM  Cosigned by: Woodroe Mode, MD [09/06/2014 10:44 PM]   Addendum [place] report does not show FHM but the patient requests we proceed with laparoscopy due to pain   CLINICAL DATA: Persistent right lower quadrant abdominal pain and  vaginal spotting since October 2nd, worsening each day. Concern for  ectopic pregnancy. Recent dose of methotrexate. Follow-up encounter.  EXAM:  TRANSVAGINAL OB ULTRASOUND  TECHNIQUE:  Transvaginal ultrasound was performed for complete evaluation of the  gestation as well as the maternal uterus, adnexal regions, and  pelvic cul-de-sac.  COMPARISON: Pelvic ultrasound from 09/02/2014  FINDINGS:  Intrauterine gestational sac: None seen.  Yolk sac: N/A  Embryo: N/A  Maternal uterus/adnexae: There is a heterogeneous 6.6 x 3.5 x 3.8 cm  mass at the right adnexa, to the right of the right ovary.  Associated blood flow is seen. There is significant associated focal  tenderness, and this is suspicious for an ectopic pregnancy. No  associated free fluid is seen to suggest rupture at this time.  The ovaries are unremarkable in appearance, though the  right ovary  is  difficult to fully assess due to the adjacent mass. The right  ovary measures approximately 3.8 x 2.4 x 1.8 cm, while the left  ovary measures 2.8 x 1.8 x 1.5 cm. There is no evidence for ovarian  torsion.  No free fluid is seen within the pelvic cul-de-sac.  IMPRESSION:  1. Heterogeneous 6.6 x 3.5 x 3.8 cm mass at the right adnexa,  adjacent to the right ovary, with associated blood flow and focal  tenderness. Findings compatible with persistent ectopic pregnancy.  No free fluid seen at this time to suggest rupture.  2. No evidence of intrauterine pregnancy.  Critical Value/emergent results were called by telephone at the time  of interpretation on 09/06/2014 at 9:41 pm to Dr. Dian Situ, who verbally  acknowledged these results.  Electronically Signed  By: Garald Balding M.D.  On: 09/06/2014 21:41       Woodroe Mode, MD 09/06/2014 11:07 PM

## 2014-09-06 NOTE — MAU Note (Signed)
Pt received MTX 2 days ago for ectopic.  RLQ pain started yesterday, has become worse today.  Scant amount of intermittent bleeding.

## 2014-09-06 NOTE — MAU Note (Signed)
Pt states she was here 2 days ago, ectopic pregnancy was confirmed and she was given Methotrexate. Pt was given pain meds 4 days ago, she has used them all...but also states it didn't help much at all. Today she presents with pain 10/10.

## 2014-09-06 NOTE — MAU Provider Note (Signed)
History     CSN: [id number]  Arrival date and time: 09/06/14 1851   None     Chief Complaint  Patient presents with  . Abdominal Pain   Patient is a 33 y.o. female presenting with abdominal pain.  Abdominal Pain The primary symptoms of the illness include abdominal pain and nausea.   Gwendolyn Glass is a 33 y.o. [phone] @ [redacted]w[redacted]d gestation who presents to the MAU with abdominal pain. She was evaluated @ [facility] ED 09/02/14 for bleeding and pain in early pregnancy and transferred to MAU. She followed up again 10/4 for Bhcg and her pain was less at that time. Today she returns due to severe pain in the RLQ. She has a history of ectopic pregnancy on the left that required surgery.  Bhcg on 10/2 was 1353 and on 10/4 was 1625. Patient received MTX on 10/4 for presumed ectopic pregnancy with abnormal rise in Bhcg. Patient was scheduled to return day 4 after MTX but states that the pain became severe and she came back tonight.   OB History   Grav Para Term Preterm Abortions TAB SAB Ect Mult Living   10 2 2  7  6 1         Past Medical History  Diagnosis Date  . Pregnant   . Miscarriage     pt. states shes  spotting  and wearing a tampon    Past Surgical History  Procedure Laterality Date  . Appendectomy    . Cesarean section    . Cholecystectomy    . Cholecystectomy  07/28/2012    Procedure: LAPAROSCOPIC CHOLECYSTECTOMY;  Surgeon: Donato Heinz, MD;  Location: [facility];  Service: General;  Laterality: N/A;  . Removal of lt fallopian tube      No family history on file.  History  Substance Use Topics  . Smoking status: Current Every Day Smoker  . Smokeless tobacco: Not on file  . Alcohol Use: Yes    Allergies:  Allergies  Allergen Reactions  . Cinnamon Swelling and Other (See Comments)    REACTION: throat swells  . Vicodin [Hydrocodone-Acetaminophen] Nausea And Vomiting  . Bee Venom Swelling and Other (See Comments)    REACTION: All over body swelling  . Dilaudid  [Hydromorphone Hcl] Hives  . Toradol [Ketorolac Tromethamine]     Hives, and chest pain    Prescriptions prior to admission  Medication Sig Dispense Refill  . acetaminophen (TYLENOL) 500 MG tablet Take 1,000 mg by mouth every 6 (six) hours as needed.      . citalopram (CELEXA) 20 MG tablet Take 20 mg by mouth daily.      Marland Kitchen gabapentin (NEURONTIN) 100 MG capsule Take 1 capsule (100 mg total) by mouth 3 (three) times daily.  60 capsule  0  . Multiple Vitamin (MULTIVITAMIN) tablet Take 1 tablet by mouth daily.      Marland Kitchen oxyCODONE-acetaminophen (PERCOCET/ROXICET) 5-325 MG per tablet Take 1-2 tablets by mouth every 6 (six) hours as needed.  10 tablet  0  . Polyethylene Glycol 400 (BLINK TEARS) 0.25 % SOLN Apply to eye.        Review of Systems  Gastrointestinal: Positive for nausea and abdominal pain.  All other systems negative  Physical Exam   Blood pressure 145/79, pulse 123, temperature 98.9 F (37.2 C), temperature source Oral, resp. rate 20, height 5\' 8"  (1.727 m), weight 219 lb (99.338 kg), last menstrual period 07/28/2014.  Physical Exam  Constitutional: She is oriented to person,  place, and time. She appears well-developed and well-nourished. No distress.  HENT:  Head: Normocephalic and atraumatic.  Eyes: EOM are normal.  Neck: Neck supple.  Cardiovascular: Normal rate.   Respiratory: Effort normal.  GI: Soft. There is tenderness in the right lower quadrant.  Genitourinary: Vagina normal.  Musculoskeletal: Normal range of motion.  Neurological: She is alert and oriented to person, place, and time.  Skin: Skin is warm and dry.  Psychiatric: She has a normal mood and affect. Her behavior is normal. Judgment and thought content normal.   Results for orders placed during the hospital encounter of 09/06/14 (from the past 24 hour(s))  HCG, QUANTITATIVE, PREGNANCY     Status: Abnormal   Collection Time    09/06/14  7:20 PM      Result Value Ref Range   hCG, Beta Chain, Quant, S  1962 (*) <5 mIU/mL  CBC     Status: None   Collection Time    09/06/14  7:20 PM      Result Value Ref Range   WBC 9.8  4.0 - 10.5 K/uL   RBC 4.63  3.87 - 5.11 MIL/uL   Hemoglobin 13.9  12.0 - 15.0 g/dL   HCT 40.8  36.0 - 46.0 %   MCV 88.1  78.0 - 100.0 fL   MCH 30.0  26.0 - 34.0 pg   MCHC 34.1  30.0 - 36.0 g/dL   RDW 14.7  11.5 - 15.5 %   Platelets 170  150 - 400 K/uL    MAU Course  Procedures Labs, ultrasound, IV NS, Phenergan 12.5 mg IV, Morphine 4 mg IV MDM  Assessment and Plan  33 y.o. female with right lower quadrant pain that is severe. Called report from ultrasound that patient has a living right ectopic pregnancy.  Dr. Roselie Awkward notified and he will see the patient in MAU.   Javelle Donigan 09/06/2014, 7:21 PM

## 2014-09-06 NOTE — MAU Provider Note (Addendum)
Agree with above note and US showed ectopic pregnancy with FHM. She will require surgical management and I offered laparoscopy and removal of the pregnancy with likely salpingectomy but possible salpingostomy. The procedure and the risk of anesthesia, bleeding, infection, bowel and bladder injury, failure (1/200) and future ectopic pregnancy were discussed and her questions were answered.   Woodroe Mode, MD 09/06/2014 10:44 PM Addendum to above-US report does not confirm verbal report that was given to staff in MAU, no embryo or FHM. Patient states her pain is too bad to continue without surgery and requests that we proceed. She understands pain postop may not be improved. H&P updated as well  Woodroe Mode, MD 09/06/2014 11:05 PM

## 2014-09-06 NOTE — Anesthesia Preprocedure Evaluation (Signed)
Anesthesia Evaluation  Patient identified by MRN, date of birth, ID band Patient awake    Reviewed: Allergy & Precautions, H&P , NPO status , Patient's Chart, lab work & pertinent test results  Airway Mallampati: III TM Distance: >3 FB Neck ROM: Full    Dental no notable dental hx. (+) Teeth Intact   Pulmonary Current Smoker,  breath sounds clear to auscultation  Pulmonary exam normal       Cardiovascular negative cardio ROS  Rhythm:Regular Rate:Normal     Neuro/Psych negative neurological ROS  negative psych ROS   GI/Hepatic negative GI ROS, Neg liver ROS,   Endo/Other  Obesity  Renal/GU negative Renal ROS  negative genitourinary   Musculoskeletal negative musculoskeletal ROS (+)   Abdominal (+) + obese,   Peds  Hematology negative hematology ROS (+)   Anesthesia Other Findings   Reproductive/Obstetrics (+) Pregnancy Ectopic pregnancy                           Anesthesia Physical Anesthesia Plan  ASA: II  Anesthesia Plan: General   Post-op Pain Management:    Induction: Intravenous, Rapid sequence and Cricoid pressure planned  Airway Management Planned: Oral ETT  Additional Equipment:   Intra-op Plan:   Post-operative Plan: Extubation in OR  Informed Consent: I have reviewed the patients History and Physical, chart, labs and discussed the procedure including the risks, benefits and alternatives for the proposed anesthesia with the patient or authorized representative who has indicated his/her understanding and acceptance.   Dental advisory given  Plan Discussed with: CRNA, Anesthesiologist and Surgeon  Anesthesia Plan Comments:         Anesthesia Quick Evaluation

## 2014-09-07 ENCOUNTER — Encounter (HOSPITAL_COMMUNITY)
Admission: AD | Disposition: A | Discharge status: Home / Self Care | Payer: Self-pay | Source: Ambulatory Visit | Attending: Obstetrics & Gynecology

## 2014-09-07 ENCOUNTER — Encounter (HOSPITAL_COMMUNITY): Payer: BC Managed Care – PPO | Admitting: Anesthesiology

## 2014-09-07 ENCOUNTER — Inpatient Hospital Stay (HOSPITAL_COMMUNITY): Payer: BC Managed Care – PPO | Admitting: Anesthesiology

## 2014-09-07 ENCOUNTER — Encounter (HOSPITAL_COMMUNITY): Payer: Self-pay | Admitting: Anesthesiology

## 2014-09-07 DIAGNOSIS — O00109 Unspecified tubal pregnancy without intrauterine pregnancy: Secondary | ICD-10-CM

## 2014-09-07 DIAGNOSIS — O001 Tubal pregnancy: Secondary | ICD-10-CM | POA: Diagnosis not present

## 2014-09-07 HISTORY — PX: LAPAROSCOPY: SHX197

## 2014-09-07 HISTORY — PX: UNILATERAL SALPINGECTOMY: SHX6160

## 2014-09-07 SURGERY — LAPAROSCOPY OPERATIVE
Anesthesia: General | Site: Abdomen | Laterality: Right

## 2014-09-07 MED ORDER — FENTANYL CITRATE 0.05 MG/ML IJ SOLN
INTRAMUSCULAR | Status: AC
  Filled 2014-09-07: qty 2

## 2014-09-07 MED ORDER — GLYCOPYRROLATE 0.2 MG/ML IJ SOLN
INTRAMUSCULAR | Status: AC
  Filled 2014-09-07: qty 3

## 2014-09-07 MED ORDER — SUCCINYLCHOLINE CHLORIDE 20 MG/ML IJ SOLN
INTRAMUSCULAR | Status: DC | PRN
  Administered 2014-09-07: 120 mg via INTRAVENOUS

## 2014-09-07 MED ORDER — PROPOFOL 10 MG/ML IV EMUL
INTRAVENOUS | Status: AC
  Filled 2014-09-07: qty 20

## 2014-09-07 MED ORDER — ROCURONIUM BROMIDE 100 MG/10ML IV SOLN
INTRAVENOUS | Status: DC | PRN
  Administered 2014-09-07: 10 mg via INTRAVENOUS
  Administered 2014-09-07: 30 mg via INTRAVENOUS

## 2014-09-07 MED ORDER — MIDAZOLAM HCL 2 MG/2ML IJ SOLN
INTRAMUSCULAR | Status: AC
  Filled 2014-09-07: qty 2

## 2014-09-07 MED ORDER — FENTANYL CITRATE 0.05 MG/ML IJ SOLN
25.0000 ug | INTRAMUSCULAR | Status: DC | PRN
  Administered 2014-09-07: 50 ug via INTRAVENOUS

## 2014-09-07 MED ORDER — ROCURONIUM BROMIDE 100 MG/10ML IV SOLN
INTRAVENOUS | Status: AC
  Filled 2014-09-07: qty 1

## 2014-09-07 MED ORDER — FENTANYL CITRATE 0.05 MG/ML IJ SOLN
INTRAMUSCULAR | Status: DC | PRN
  Administered 2014-09-07: 250 ug via INTRAVENOUS
  Administered 2014-09-07: 100 ug via INTRAVENOUS

## 2014-09-07 MED ORDER — FENTANYL CITRATE 0.05 MG/ML IJ SOLN
INTRAMUSCULAR | Status: AC
  Filled 2014-09-07: qty 5

## 2014-09-07 MED ORDER — LACTATED RINGERS IV SOLN
INTRAVENOUS | Status: DC | PRN
  Administered 2014-09-07 (×2): via INTRAVENOUS

## 2014-09-07 MED ORDER — ONDANSETRON HCL 4 MG/2ML IJ SOLN
INTRAMUSCULAR | Status: AC
  Filled 2014-09-07: qty 2

## 2014-09-07 MED ORDER — GLYCOPYRROLATE 0.2 MG/ML IJ SOLN
INTRAMUSCULAR | Status: DC | PRN
  Administered 2014-09-07: 0.6 mg via INTRAVENOUS

## 2014-09-07 MED ORDER — LIDOCAINE HCL (CARDIAC) 20 MG/ML IV SOLN
INTRAVENOUS | Status: DC | PRN
  Administered 2014-09-07: 100 mg via INTRAVENOUS

## 2014-09-07 MED ORDER — MIDAZOLAM HCL 2 MG/2ML IJ SOLN
INTRAMUSCULAR | Status: DC | PRN
  Administered 2014-09-07: 2 mg via INTRAVENOUS

## 2014-09-07 MED ORDER — METOCLOPRAMIDE HCL 5 MG/ML IJ SOLN: 10.0000 mg | Freq: Once | INTRAMUSCULAR | Status: DC | PRN

## 2014-09-07 MED ORDER — LIDOCAINE HCL (CARDIAC) 20 MG/ML IV SOLN
INTRAVENOUS | Status: AC
  Filled 2014-09-07: qty 5

## 2014-09-07 MED ORDER — SCOPOLAMINE 1 MG/3DAYS TD PT72
MEDICATED_PATCH | TRANSDERMAL | Status: DC
Start: 2014-09-07 — End: 2014-09-07
  Filled 2014-09-07: qty 1

## 2014-09-07 MED ORDER — DEXAMETHASONE SODIUM PHOSPHATE 10 MG/ML IJ SOLN
INTRAMUSCULAR | Status: AC
  Filled 2014-09-07: qty 1

## 2014-09-07 MED ORDER — NEOSTIGMINE METHYLSULFATE 10 MG/10ML IV SOLN
INTRAVENOUS | Status: DC | PRN
  Administered 2014-09-07: 4 mg via INTRAVENOUS

## 2014-09-07 MED ORDER — NEOSTIGMINE METHYLSULFATE 10 MG/10ML IV SOLN
INTRAVENOUS | Status: AC
  Filled 2014-09-07: qty 1

## 2014-09-07 MED ORDER — MEPERIDINE HCL 25 MG/ML IJ SOLN: 6.2500 mg | INTRAMUSCULAR | Status: DC | PRN

## 2014-09-07 MED ORDER — OXYCODONE-ACETAMINOPHEN 5-325 MG PO TABS: 1.0000 | ORAL_TABLET | Freq: Four times a day (QID) | ORAL | Status: DC | PRN

## 2014-09-07 MED ORDER — KETOROLAC TROMETHAMINE 30 MG/ML IJ SOLN
INTRAMUSCULAR | Status: AC
  Filled 2014-09-07: qty 1

## 2014-09-07 MED ORDER — DEXAMETHASONE SODIUM PHOSPHATE 10 MG/ML IJ SOLN
INTRAMUSCULAR | Status: DC | PRN
  Administered 2014-09-07: 4 mg via INTRAVENOUS

## 2014-09-07 MED ORDER — ONDANSETRON HCL 4 MG/2ML IJ SOLN
INTRAMUSCULAR | Status: DC | PRN
  Administered 2014-09-07: 4 mg via INTRAVENOUS

## 2014-09-07 MED ORDER — SCOPOLAMINE 1 MG/3DAYS TD PT72
1.0000 | MEDICATED_PATCH | Freq: Once | TRANSDERMAL | Status: DC
  Administered 2014-09-07: 1.5 mg via TRANSDERMAL

## 2014-09-07 MED ORDER — OXYCODONE-ACETAMINOPHEN 5-325 MG PO TABS
1.0000 | ORAL_TABLET | Freq: Four times a day (QID) | ORAL | Status: DC | PRN
  Administered 2014-09-07: 2 via ORAL

## 2014-09-07 MED ORDER — PROPOFOL 10 MG/ML IV BOLUS
INTRAVENOUS | Status: DC | PRN
  Administered 2014-09-07: 200 mg via INTRAVENOUS

## 2014-09-07 MED ORDER — LACTATED RINGERS IR SOLN
Status: DC | PRN
  Administered 2014-09-07: 3000 mL

## 2014-09-07 MED ORDER — OXYCODONE-ACETAMINOPHEN 5-325 MG PO TABS
ORAL_TABLET | ORAL | Status: AC
  Administered 2014-09-07: 2 via ORAL
  Filled 2014-09-07: qty 2

## 2014-09-07 MED ORDER — BUPIVACAINE HCL (PF) 0.25 % IJ SOLN
INTRAMUSCULAR | Status: DC | PRN
  Administered 2014-09-07: 2 mL
  Administered 2014-09-07: 3 mL

## 2014-09-07 SURGICAL SUPPLY — 29 items
BLADE SURG 11 STRL SS (BLADE) ×3 IMPLANT
CABLE HIGH FREQUENCY MONO STRZ (ELECTRODE) IMPLANT
CATH ROBINSON RED A/P 16FR (CATHETERS) IMPLANT
CHLORAPREP W/TINT 26ML (MISCELLANEOUS) ×3 IMPLANT
CLOTH BEACON ORANGE TIMEOUT ST (SAFETY) ×3 IMPLANT
DERMABOND ADVANCED (GAUZE/BANDAGES/DRESSINGS)
DERMABOND ADVANCED .7 DNX12 (GAUZE/BANDAGES/DRESSINGS) IMPLANT
DRSG COVADERM PLUS 2X2 (GAUZE/BANDAGES/DRESSINGS) ×6 IMPLANT
DRSG OPSITE POSTOP 3X4 (GAUZE/BANDAGES/DRESSINGS) IMPLANT
GLOVE BIO SURGEON STRL SZ 6.5 (GLOVE) ×3 IMPLANT
GLOVE BIOGEL PI IND STRL 7.0 (GLOVE) ×2 IMPLANT
GLOVE BIOGEL PI INDICATOR 7.0 (GLOVE) ×1
GOWN STRL REUS W/TWL LRG LVL3 (GOWN DISPOSABLE) ×6 IMPLANT
NEEDLE INSUFFLATION 120MM (ENDOMECHANICALS) IMPLANT
NS IRRIG 1000ML POUR BTL (IV SOLUTION) ×3 IMPLANT
PACK LAPAROSCOPY BASIN (CUSTOM PROCEDURE TRAY) ×3 IMPLANT
POUCH SPECIMEN RETRIEVAL 10MM (ENDOMECHANICALS) IMPLANT
PROTECTOR NERVE ULNAR (MISCELLANEOUS) ×3 IMPLANT
SET IRRIG TUBING LAPAROSCOPIC (IRRIGATION / IRRIGATOR) IMPLANT
SHEARS HARMONIC ACE PLUS 36CM (ENDOMECHANICALS) IMPLANT
STRIP CLOSURE SKIN 1/2X4 (GAUZE/BANDAGES/DRESSINGS) IMPLANT
SUT VICRYL 0 UR6 27IN ABS (SUTURE) ×3 IMPLANT
SUT VICRYL 4-0 PS2 18IN ABS (SUTURE) ×3 IMPLANT
TOWEL OR 17X24 6PK STRL BLUE (TOWEL DISPOSABLE) ×6 IMPLANT
TRAY FOLEY CATH 14FR (SET/KITS/TRAYS/PACK) IMPLANT
TROCAR XCEL DIL TIP R 11M (ENDOMECHANICALS) ×3 IMPLANT
TROCAR XCEL NON-BLD 5MMX100MML (ENDOMECHANICALS) ×3 IMPLANT
WARMER LAPAROSCOPE (MISCELLANEOUS) ×3 IMPLANT
WATER STERILE IRR 1000ML POUR (IV SOLUTION) IMPLANT

## 2014-09-07 NOTE — Op Note (Signed)
Diagnostic Laparoscopy Procedure Note  Indications: The patient is a 33 y.o. female with right tubal ectopic pregnancy.  Pre-operative Diagnosis: Right tubal ectopic pregnancy  Post-operative Diagnosis: Same  Surgeon: Bluford Sedler   Assistants: None  Anesthesia:General endotracheal anesthesia}  ASA Class: 2  Procedure Details  The patient was seen in the Holding Room. The risks, benefits, complications, treatment options, and expected outcomes were discussed with the patient. The possibilities of reaction to medication, pulmonary aspiration, perforation of viscus, bleeding, recurrent infection, the need for additional procedures, failure to diagnose a condition, and creating a complication requiring transfusion or operation were discussed with the patient. The patient concurred with the proposed plan, giving informed consent. The patient was taken to the Operating Room, identified as Gwendolyn Glass and the procedure verified as Diagnostic Laparoscopy. A Time Out was held and the above information confirmed.  After induction of general anesthesia, the patient was placed in modified dorsal lithotomy position where she was prepped, draped, and catheterized in the normal, sterile fashion.  The cervix was visualized and an intrauterine manipulator was placed. A 1.5 cm umbilical incision was then performed. Veress needle was passed and pneumoperitoneum was established. The above findings were noted. An 11 mm trocar placed through the incision and laparoscope was inserted with video camera and use. Patient placed in Trendelenburg position. Uterus appeared normal there was a small amount of blood in the pelvis. Uterine manipulator was used to expose the right adnexa. There appeared to be some swelling but also some adhesions on the right side. 5 mm ports were placed in the right lower quadrant and left lower quadrant. Graspers were used to manipulate the right fallopian tube. Table elevated the tube  with blunt dissection of a few adhesions. The harmonic scalpel was to excise the right fallopian tube with the tubal pregnancy. Specimen was read removed with Endopouch retriever through the umbilical incision Hemostasis was seen. The pelvis was irrigated. Both ovaries appeared normal. The left fallopian tube had been removed at previous surgery.  Following the procedure the umbilical sheath was removed after intra-abdominal carbon dioxide was expressed. The incision was closed with 0 Vicryl to close the fascia and subcuticular sutures of 4-0 Vicryl. Dermabond was used to close the incisions. The intrauterine manipulator was then removed.  Instrument, sponge, and needle counts were correct prior to abdominal closure and at the conclusion of the case.   Findings: The anterior cul-de-sac and round ligaments normal The uterus normal The adnexa as noted above Cul-de-sac small amount of blood  Estimated Blood Loss:  less than 50 mL         Drains: Foley catheter         Total IV Fluids: 1200 mL         Specimens: Right fallopian tube with ectopic pregnancy              Complications:  None; patient tolerated the procedure well.         Disposition: PACU - hemodynamically stable.         Condition: stable  Dr. Emeterio Reeve 09/07/2014  1:31 AM

## 2014-09-07 NOTE — Anesthesia Postprocedure Evaluation (Signed)
  Anesthesia Post-op Note  Patient: Gwendolyn Glass  Procedure(s) Performed: Procedure(s): LAPAROSCOPY OPERATIVE (N/A)  Patient Location: PACU  Anesthesia Type:General  Level of Consciousness: awake, alert  and oriented  Airway and Oxygen Therapy: Patient Spontanous Breathing  Post-op Pain: mild  Post-op Assessment: Post-op Vital signs reviewed, Patient's Cardiovascular Status Stable, Respiratory Function Stable, Patent Airway, No signs of Nausea or vomiting and Pain level controlled  Post-op Vital Signs: Reviewed and stable  Last Vitals:  Filed Vitals:   09/07/14 0145  BP: 112/57  Pulse: 111  Temp:   Resp: 14    Complications: No apparent anesthesia complications

## 2014-09-07 NOTE — Discharge Instructions (Signed)

## 2014-09-07 NOTE — Transfer of Care (Signed)
Immediate Anesthesia Transfer of Care Note  Patient: Gwendolyn Glass  Procedure(s) Performed: Procedure(s): LAPAROSCOPY OPERATIVE (N/A)  Patient Location: PACU  Anesthesia Type:General  Level of Consciousness: awake, alert , oriented and patient cooperative  Airway & Oxygen Therapy: Patient Spontanous Breathing and Patient connected to nasal cannula oxygen  Post-op Assessment: Report given to PACU RN, Post -op Vital signs reviewed and stable and Patient moving all extremities X 4  Post vital signs: Reviewed and stable  Complications: No apparent anesthesia complications

## 2014-09-08 ENCOUNTER — Encounter (HOSPITAL_COMMUNITY): Payer: Self-pay | Admitting: Obstetrics & Gynecology

## 2014-10-03 ENCOUNTER — Encounter (HOSPITAL_COMMUNITY): Payer: Self-pay | Admitting: Obstetrics & Gynecology

## 2014-11-09 ENCOUNTER — Encounter (HOSPITAL_COMMUNITY): Payer: Self-pay | Admitting: Obstetrics & Gynecology

## 2015-07-09 ENCOUNTER — Encounter (HOSPITAL_COMMUNITY): Payer: Self-pay | Admitting: *Deleted

## 2016-02-25 ENCOUNTER — Encounter (HOSPITAL_COMMUNITY): Payer: Self-pay | Admitting: Emergency Medicine

## 2016-02-25 ENCOUNTER — Emergency Department (HOSPITAL_COMMUNITY)
Admission: EM | Admit: 2016-02-25 | Discharge: 2016-02-25 | Disposition: A | Discharge status: Home / Self Care | Payer: BLUE CROSS/BLUE SHIELD | Source: Home / Self Care | Attending: Emergency Medicine | Admitting: Emergency Medicine

## 2016-02-25 DIAGNOSIS — Y929 Unspecified place or not applicable: Secondary | ICD-10-CM

## 2016-02-25 DIAGNOSIS — L03113 Cellulitis of right upper limb: Secondary | ICD-10-CM

## 2016-02-25 DIAGNOSIS — T7849XA Other allergy, initial encounter: Secondary | ICD-10-CM | POA: Insufficient documentation

## 2016-02-25 DIAGNOSIS — F17210 Nicotine dependence, cigarettes, uncomplicated: Secondary | ICD-10-CM

## 2016-02-25 DIAGNOSIS — Y939 Activity, unspecified: Secondary | ICD-10-CM | POA: Insufficient documentation

## 2016-02-25 DIAGNOSIS — Y999 Unspecified external cause status: Secondary | ICD-10-CM

## 2016-02-25 DIAGNOSIS — W57XXXA Bitten or stung by nonvenomous insect and other nonvenomous arthropods, initial encounter: Secondary | ICD-10-CM

## 2016-02-25 DIAGNOSIS — Z79899 Other long term (current) drug therapy: Secondary | ICD-10-CM

## 2016-02-25 DIAGNOSIS — R110 Nausea: Secondary | ICD-10-CM

## 2016-02-25 DIAGNOSIS — T63301A Toxic effect of unspecified spider venom, accidental (unintentional), initial encounter: Secondary | ICD-10-CM

## 2016-02-25 LAB — COMPREHENSIVE METABOLIC PANEL
ALK PHOS: 85 U/L (ref 38–126)
ALT: 37 U/L (ref 14–54)
AST: 28 U/L (ref 15–41)
Albumin: 4 g/dL (ref 3.5–5.0)
Anion gap: 7 (ref 5–15)
BILIRUBIN TOTAL: 0.4 mg/dL (ref 0.3–1.2)
BUN: 8 mg/dL (ref 6–20)
CO2: 22 mmol/L (ref 22–32)
Calcium: 9 mg/dL (ref 8.9–10.3)
Chloride: 110 mmol/L (ref 101–111)
Creatinine, Ser: 0.67 mg/dL (ref 0.44–1.00)
GFR calc Af Amer: >60 mL/min (ref >60)
Glucose, Bld: 107 mg/dL — ABNORMAL HIGH (ref 65–99)
POTASSIUM: 3.9 mmol/L (ref 3.5–5.1)
Sodium: 139 mmol/L (ref 135–145)
TOTAL PROTEIN: 7.2 g/dL (ref 6.5–8.1)

## 2016-02-25 LAB — CBC WITH DIFFERENTIAL/PLATELET
BASOS ABS: 0 10*3/uL (ref 0.0–0.1)
Basophils Relative: 0 %
Eosinophils Absolute: 0.2 10*3/uL (ref 0.0–0.7)
Eosinophils Relative: 2 %
HEMATOCRIT: 40.8 % (ref 36.0–46.0)
HEMOGLOBIN: 13.4 g/dL (ref 12.0–15.0)
LYMPHS PCT: 14 %
Lymphs Abs: 1.5 10*3/uL (ref 0.7–4.0)
MCH: 28.4 pg (ref 26.0–34.0)
MCHC: 32.8 g/dL (ref 30.0–36.0)
MCV: 86.4 fL (ref 78.0–100.0)
MONOS PCT: 8 %
Monocytes Absolute: 0.8 10*3/uL (ref 0.1–1.0)
NEUTROS ABS: 8.3 10*3/uL — AB (ref 1.7–7.7)
Neutrophils Relative %: 76 %
Platelets: 194 10*3/uL (ref 150–400)
RBC: 4.72 MIL/uL (ref 3.87–5.11)
RDW: 13.2 % (ref 11.5–15.5)
WBC: 10.8 10*3/uL — ABNORMAL HIGH (ref 4.0–10.5)

## 2016-02-25 LAB — PROTIME-INR
INR: 1.01 (ref 0.00–1.49)
Prothrombin Time: 13.5 seconds (ref 11.6–15.2)

## 2016-02-25 LAB — URINALYSIS, ROUTINE W REFLEX MICROSCOPIC
Bilirubin Urine: NEGATIVE
Glucose, UA: NEGATIVE mg/dL
HGB URINE DIPSTICK: NEGATIVE
Ketones, ur: NEGATIVE mg/dL
Leukocytes, UA: NEGATIVE
Nitrite: NEGATIVE
Protein, ur: NEGATIVE mg/dL
Specific Gravity, Urine: 1.015 (ref 1.005–1.030)
pH: 6 (ref 5.0–8.0)

## 2016-02-25 LAB — I-STAT BETA HCG BLOOD, ED (MC, WL, AP ONLY)

## 2016-02-25 MED ORDER — ONDANSETRON HCL 4 MG/2ML IJ SOLN
4.0000 mg | Freq: Once | INTRAMUSCULAR | Status: AC
  Administered 2016-02-25: 4 mg via INTRAVENOUS
  Filled 2016-02-25: qty 2

## 2016-02-25 MED ORDER — SULFAMETHOXAZOLE-TRIMETHOPRIM 800-160 MG PO TABS
1.0000 | ORAL_TABLET | Freq: Once | ORAL | Status: AC
Start: 2016-02-25 — End: 2016-02-25
  Administered 2016-02-25: 1 via ORAL
  Filled 2016-02-25: qty 1

## 2016-02-25 MED ORDER — OXYCODONE-ACETAMINOPHEN 5-325 MG PO TABS
1.0000 | ORAL_TABLET | Freq: Once | ORAL | Status: AC
  Administered 2016-02-25: 1 via ORAL
  Filled 2016-02-25: qty 1

## 2016-02-25 MED ORDER — SULFAMETHOXAZOLE-TRIMETHOPRIM 800-160 MG PO TABS: 1.0000 | ORAL_TABLET | Freq: Two times a day (BID) | ORAL | Status: DC

## 2016-02-25 MED ORDER — MORPHINE SULFATE (PF) 4 MG/ML IV SOLN
8.0000 mg | Freq: Once | INTRAVENOUS | Status: AC
  Administered 2016-02-25: 8 mg via INTRAVENOUS
  Filled 2016-02-25: qty 2

## 2016-02-25 MED ORDER — SODIUM CHLORIDE 0.9 % IV BOLUS (SEPSIS)
1000.0000 mL | Freq: Once | INTRAVENOUS | Status: AC
  Administered 2016-02-25: 1000 mL via INTRAVENOUS

## 2016-02-25 MED ORDER — TRAMADOL HCL 50 MG PO TABS: 50.0000 mg | ORAL_TABLET | Freq: Four times a day (QID) | ORAL | Status: DC | PRN

## 2016-02-25 NOTE — ED Provider Notes (Signed)
CSN: [id number]     Arrival date & time 02/25/16  1226 History   First MD Initiated Contact with Patient 02/25/16 1251     Chief Complaint  Patient presents with  . Insect Bite      HPI Patient reports last night she saw a Hemp recluse on her right shoulder and awoke this morning with significant redness of her right shoulder and a necrotic center.  She states she's had chills with some nausea.  No documented fever.  She reports some generalized myalgias without significant abdominal pain.  No other complaints at this time.   Past Medical History  Diagnosis Date  . Pregnant   . Miscarriage     pt. states shes  spotting  and wearing a tampon   Past Surgical History  Procedure Laterality Date  . Appendectomy    . Cesarean section    . Cholecystectomy    . Cholecystectomy  07/28/2012    Procedure: LAPAROSCOPIC CHOLECYSTECTOMY;  Surgeon: Donato Heinz, MD;  Location: [facility];  Service: General;  Laterality: N/A;  . Removal of lt fallopian tube    . Laparoscopy N/A 09/07/2014    Procedure: LAPAROSCOPY OPERATIVE;  Surgeon: Woodroe Mode, MD;  Location: [facility];  Service: Gynecology;  Laterality: N/A;  . Unilateral salpingectomy Right 09/07/2014    Procedure: UNILATERAL SALPINGECTOMY;  Surgeon: Woodroe Mode, MD;  Location: [facility];  Service: Gynecology;  Laterality: Right;   History reviewed. No pertinent family history. Social History  Substance Use Topics  . Smoking status: Current Every Day Smoker -- 0.50 packs/day    Types: Cigarettes  . Smokeless tobacco: None  . Alcohol Use: Yes   OB History    Gravida Para Term Preterm AB TAB SAB Ectopic Multiple Living   10 2 2  7  6 1        Review of Systems  All other systems reviewed and are negative.     Allergies  Cinnamon; Bee venom; Vicodin; Dilaudid; and Toradol  Home Medications   Prior to Admission medications   Medication Sig Start Date End Date Taking? Authorizing Provider  acetaminophen (TYLENOL) 500 MG tablet  Take 500 mg by mouth every 6 (six) hours as needed for mild pain.   Yes Historical Provider, MD  ALPRAZolam Duanne Moron) 1 MG tablet Take 1 tablet by mouth 2 (two) times daily as needed for anxiety.  02/17/16  Yes Historical Provider, MD  busPIRone (BUSPAR) 10 MG tablet Take 1 tablet by mouth 2 (two) times daily. 02/10/16  Yes Historical Provider, MD  escitalopram (LEXAPRO) 20 MG tablet Take 1 tablet by mouth at bedtime.  02/10/16  Yes Historical Provider, MD  sulfamethoxazole-trimethoprim (BACTRIM DS,SEPTRA DS) 800-160 MG tablet Take 1 tablet by mouth 2 (two) times daily. 02/25/16 03/03/16  Jola Schmidt, MD  traMADol (ULTRAM) 50 MG tablet Take 1 tablet (50 mg total) by mouth every 6 (six) hours as needed. 02/25/16   Jola Schmidt, MD   BP 123/81 mmHg  Pulse 107  Temp(Src) 98.8 F (37.1 C) (Oral)  Resp 18  Ht 5\' 8"  (1.727 m)  Wt 230 lb (104.327 kg)  BMI 34.98 kg/m2  SpO2 95%  LMP 02/11/2016 Physical Exam  Constitutional: She is oriented to person, place, and time. She appears well-developed and well-nourished. No distress.  HENT:  Head: Normocephalic and atraumatic.  Eyes: EOM are normal.  Neck: Normal range of motion.  Cardiovascular: Normal rate, regular rhythm and normal heart sounds.   Pulmonary/Chest: Effort normal and  breath sounds normal.  Abdominal: Soft. She exhibits no distension. There is no tenderness.  Musculoskeletal:  Right anterior shoulder with 5-6 cm area of erythema with a very punctate necrotic center.  There is a small round of surrounding induration as well.  No fluctuance.  No drainage.  Neurological: She is alert and oriented to person, place, and time.  Skin: Skin is warm and dry.  Psychiatric: She has a normal mood and affect. Judgment normal.  Nursing note and vitals reviewed.   ED Course  Procedures (including critical care time) Labs Review Labs Reviewed  CBC WITH DIFFERENTIAL/PLATELET - Abnormal; Notable for the following:    WBC 10.8 (*)    Neutro Abs 8.3 (*)     All other components within normal limits  COMPREHENSIVE METABOLIC PANEL - Abnormal; Notable for the following:    Glucose, Bld 107 (*)    All other components within normal limits  URINALYSIS, ROUTINE W REFLEX MICROSCOPIC (NOT AT [facility]) - Abnormal; Notable for the following:    Color, Urine COLORLESS (*)    All other components within normal limits  PROTIME-INR  I-STAT BETA HCG BLOOD, ED ([facility], [facility], [facility] ONLY)    Imaging Review No results found. I have personally reviewed and evaluated these images and lab results as part of my medical decision-making.   EKG Interpretation None      MDM   Final diagnoses:  Cellulitis of right upper arm  Spider bite allergy, current reaction, accidental or unintentional, initial encounter    3:45 PM Patient be treated for localized soft tissue skin infection likely from [place] recluse spider bite.  Symptomatically she is feeling much better at this time.  She understands to return to the emergency department for new or worsening symptoms.  Patient will be discharged home with tramadol and Bactrim.    Jola Schmidt, MD 02/25/16 [phone]

## 2016-02-25 NOTE — Discharge Instructions (Signed)

## 2016-02-25 NOTE — ED Notes (Signed)
Pt states she saw a spider on her right shoulder yesterday, brushed it off, and then her shoulder started itching.  Now has developed redness, swelling, blistering with black center.  Pt states she is achy and has been running a fever.  Last Tylenol 2 hours ago.

## 2016-02-26 ENCOUNTER — Inpatient Hospital Stay (HOSPITAL_COMMUNITY)
Admission: EM | Admit: 2016-02-26 | Discharge: 2016-03-02 | DRG: 571 | Disposition: A | Discharge status: Home / Self Care | Payer: BLUE CROSS/BLUE SHIELD | Attending: Internal Medicine | Admitting: Internal Medicine

## 2016-02-26 ENCOUNTER — Encounter (HOSPITAL_COMMUNITY): Payer: Self-pay | Admitting: Emergency Medicine

## 2016-02-26 DIAGNOSIS — R1031 Right lower quadrant pain: Secondary | ICD-10-CM | POA: Diagnosis not present

## 2016-02-26 DIAGNOSIS — R112 Nausea with vomiting, unspecified: Secondary | ICD-10-CM | POA: Diagnosis not present

## 2016-02-26 DIAGNOSIS — N838 Other noninflammatory disorders of ovary, fallopian tube and broad ligament: Secondary | ICD-10-CM | POA: Diagnosis present

## 2016-02-26 DIAGNOSIS — Z720 Tobacco use: Secondary | ICD-10-CM | POA: Diagnosis present

## 2016-02-26 DIAGNOSIS — F17210 Nicotine dependence, cigarettes, uncomplicated: Secondary | ICD-10-CM | POA: Diagnosis present

## 2016-02-26 DIAGNOSIS — L03113 Cellulitis of right upper limb: Principal | ICD-10-CM | POA: Diagnosis present

## 2016-02-26 DIAGNOSIS — T63331D Toxic effect of venom of brown recluse spider, accidental (unintentional), subsequent encounter: Secondary | ICD-10-CM

## 2016-02-26 DIAGNOSIS — L0390 Cellulitis, unspecified: Secondary | ICD-10-CM | POA: Diagnosis present

## 2016-02-26 DIAGNOSIS — Y92018 Other place in single-family (private) house as the place of occurrence of the external cause: Secondary | ICD-10-CM

## 2016-02-26 DIAGNOSIS — T63331A Toxic effect of venom of brown recluse spider, accidental (unintentional), initial encounter: Secondary | ICD-10-CM | POA: Diagnosis present

## 2016-02-26 DIAGNOSIS — F418 Other specified anxiety disorders: Secondary | ICD-10-CM | POA: Diagnosis present

## 2016-02-26 DIAGNOSIS — A047 Enterocolitis due to Clostridium difficile: Secondary | ICD-10-CM | POA: Diagnosis present

## 2016-02-26 DIAGNOSIS — A0472 Enterocolitis due to Clostridium difficile, not specified as recurrent: Secondary | ICD-10-CM | POA: Diagnosis present

## 2016-02-26 DIAGNOSIS — Z6834 Body mass index (BMI) 34.0-34.9, adult: Secondary | ICD-10-CM

## 2016-02-26 HISTORY — DX: Enterocolitis due to Clostridium difficile, not specified as recurrent: A04.72

## 2016-02-26 HISTORY — DX: Toxic effect of venom of brown recluse spider, accidental (unintentional), initial encounter: T63.331A

## 2016-02-26 HISTORY — DX: Other noninflammatory disorders of ovary, fallopian tube and broad ligament: N83.8

## 2016-02-26 LAB — CBC WITH DIFFERENTIAL/PLATELET
BASOS PCT: 0 %
Basophils Absolute: 0 10*3/uL (ref 0.0–0.1)
EOS ABS: 0.2 10*3/uL (ref 0.0–0.7)
EOS PCT: 2 %
HCT: 43 % (ref 36.0–46.0)
HEMOGLOBIN: 14.2 g/dL (ref 12.0–15.0)
LYMPHS ABS: 2.3 10*3/uL (ref 0.7–4.0)
Lymphocytes Relative: 21 %
MCH: 28.7 pg (ref 26.0–34.0)
MCHC: 33 g/dL (ref 30.0–36.0)
MCV: 86.9 fL (ref 78.0–100.0)
MONO ABS: 0.8 10*3/uL (ref 0.1–1.0)
MONOS PCT: 7 %
NEUTROS PCT: 70 %
Neutro Abs: 7.4 10*3/uL (ref 1.7–7.7)
PLATELETS: 214 10*3/uL (ref 150–400)
RBC: 4.95 MIL/uL (ref 3.87–5.11)
RDW: 13.6 % (ref 11.5–15.5)
WBC: 10.7 10*3/uL — ABNORMAL HIGH (ref 4.0–10.5)

## 2016-02-26 LAB — COMPREHENSIVE METABOLIC PANEL
ALK PHOS: 106 U/L (ref 38–126)
ALT: 76 U/L — AB (ref 14–54)
ANION GAP: 9 (ref 5–15)
AST: 44 U/L — ABNORMAL HIGH (ref 15–41)
Albumin: 4.2 g/dL (ref 3.5–5.0)
BUN: 7 mg/dL (ref 6–20)
CALCIUM: 8.7 mg/dL — AB (ref 8.9–10.3)
CO2: 24 mmol/L (ref 22–32)
CREATININE: 0.77 mg/dL (ref 0.44–1.00)
Chloride: 105 mmol/L (ref 101–111)
Glucose, Bld: 101 mg/dL — ABNORMAL HIGH (ref 65–99)
Potassium: 3.6 mmol/L (ref 3.5–5.1)
Sodium: 138 mmol/L (ref 135–145)
Total Bilirubin: 0.7 mg/dL (ref 0.3–1.2)
Total Protein: 7.9 g/dL (ref 6.5–8.1)

## 2016-02-26 LAB — LACTIC ACID, PLASMA: LACTIC ACID, VENOUS: 0.8 mmol/L (ref 0.5–2.0)

## 2016-02-26 MED ORDER — VANCOMYCIN HCL IN DEXTROSE 1-5 GM/200ML-% IV SOLN
1000.0000 mg | Freq: Once | INTRAVENOUS | Status: AC
  Administered 2016-02-26: 1000 mg via INTRAVENOUS
  Filled 2016-02-26: qty 200

## 2016-02-26 MED ORDER — HYDROMORPHONE HCL 1 MG/ML IJ SOLN
1.0000 mg | Freq: Once | INTRAMUSCULAR | Status: DC
  Filled 2016-02-26: qty 1

## 2016-02-26 MED ORDER — ONDANSETRON HCL 4 MG/2ML IJ SOLN
4.0000 mg | Freq: Once | INTRAMUSCULAR | Status: AC
  Administered 2016-02-26: 4 mg via INTRAVENOUS
  Filled 2016-02-26: qty 2

## 2016-02-26 NOTE — ED Notes (Signed)
Pt asking to speak to supervisor, pt upset at wait time.  Explained to patient we are trying to get her seen asap.

## 2016-02-26 NOTE — ED Notes (Signed)
Patient seen yesterday for [place] recluse spider bite to right shoulder. Redness spreading. +Fever with tylenol taken at home.

## 2016-02-26 NOTE — ED Provider Notes (Signed)
CSN: [id number]     Arrival date & time 02/26/16  1438 History   First MD Initiated Contact with Patient 02/26/16 2146     Chief Complaint  Patient presents with  . Wound Check     (Consider location/radiation/quality/duration/timing/severity/associated sxs/prior Treatment) Patient is a 35 y.o. female presenting with wound check. The history is provided by the patient.  Wound Check The current episode started yesterday. The problem has been gradually worsening. Associated symptoms include abdominal pain, chills, a fever, myalgias and nausea.   Gwendolyn Glass is a 35 y.o. female who presents to the ED for recheck of spider bite. She was evaluated here yesterday and started on Bactrim which she has been taking as directed. Today she reports that the redness has increased and and now has streaking. She reports axillary tenderness, increased pain at the site of the bite, nausea and generalized body aches. She called her doctor this afternoon to see if she could see her and after describing the area was told to come back to the ED.   Past Medical History  Diagnosis Date  . Pregnant   . Miscarriage     pt. states shes  spotting  and wearing a tampon   Past Surgical History  Procedure Laterality Date  . Appendectomy    . Cesarean section    . Cholecystectomy    . Cholecystectomy  07/28/2012    Procedure: LAPAROSCOPIC CHOLECYSTECTOMY;  Surgeon: Donato Heinz, MD;  Location: [facility];  Service: General;  Laterality: N/A;  . Removal of lt fallopian tube    . Laparoscopy N/A 09/07/2014    Procedure: LAPAROSCOPY OPERATIVE;  Surgeon: Woodroe Mode, MD;  Location: [facility];  Service: Gynecology;  Laterality: N/A;  . Unilateral salpingectomy Right 09/07/2014    Procedure: UNILATERAL SALPINGECTOMY;  Surgeon: Woodroe Mode, MD;  Location: [facility];  Service: Gynecology;  Laterality: Right;   No family history on file. Social History  Substance Use Topics  . Smoking status: Current Every Day Smoker  -- 0.50 packs/day    Types: Cigarettes  . Smokeless tobacco: None  . Alcohol Use: Yes   OB History    Gravida Para Term Preterm AB TAB SAB Ectopic Multiple Living   10 2 2  7  6 1        Review of Systems  Constitutional: Positive for fever and chills.  HENT: Negative.   Gastrointestinal: Positive for nausea and abdominal pain.  Musculoskeletal: Positive for myalgias.  Skin: Positive for wound.  all other systems negative    Allergies  Cinnamon; Bee venom; Vicodin; Dilaudid; and Toradol  Home Medications   Prior to Admission medications   Medication Sig Start Date End Date Taking? Authorizing Provider  acetaminophen (TYLENOL) 500 MG tablet Take 500 mg by mouth every 6 (six) hours as needed for mild pain.   Yes Historical Provider, MD  ALPRAZolam Duanne Moron) 1 MG tablet Take 1 tablet by mouth 2 (two) times daily as needed for anxiety.  02/17/16  Yes Historical Provider, MD  busPIRone (BUSPAR) 10 MG tablet Take 1 tablet by mouth 2 (two) times daily. 02/10/16  Yes Historical Provider, MD  escitalopram (LEXAPRO) 20 MG tablet Take 1 tablet by mouth at bedtime.  02/10/16  Yes Historical Provider, MD  sulfamethoxazole-trimethoprim (BACTRIM DS,SEPTRA DS) 800-160 MG tablet Take 1 tablet by mouth 2 (two) times daily. 02/25/16 03/03/16 Yes Jola Schmidt, MD  traMADol (ULTRAM) 50 MG tablet Take 1 tablet (50 mg total) by mouth every 6 (six)  hours as needed. 02/25/16   Jola Schmidt, MD   BP 107/77 mmHg  Pulse 94  Temp(Src) 98.1 F (36.7 C) (Oral)  Resp 20  Ht 5\' 8"  (1.727 m)  Wt 104.327 kg  BMI 34.98 kg/m2  SpO2 100%  LMP 02/11/2016 Physical Exam  Constitutional: She is oriented to person, place, and time. She appears well-developed and well-nourished.  HENT:  Head: Normocephalic and atraumatic.  Eyes: EOM are normal.  Neck: Normal range of motion. Neck supple.  Cardiovascular: Normal rate.   Pulmonary/Chest: Effort normal.  Abdominal: Soft. There is no tenderness.  Musculoskeletal:        Right shoulder: She exhibits tenderness and swelling. She exhibits normal pulse and normal strength.  Area of erythema surrounding a black center where patient was bitten by a spider yesterday. Red streaking now noted that extends toward the right chest. Axillary tenderness on exam but not definite lymph node palpated.    Neurological: She is alert and oriented to person, place, and time. No cranial nerve deficit.  Skin: Skin is warm and dry.  Psychiatric: She has a normal mood and affect. Her behavior is normal.  Nursing note and vitals reviewed.   ED Course  Procedures (including critical care time) Labs Review Results for orders placed or performed during the hospital encounter of 02/26/16 (from the past 24 hour(s))  CBC with Differential     Status: Abnormal   Collection Time: 02/26/16  9:20 PM  Result Value Ref Range   WBC 10.7 (H) 4.0 - 10.5 K/uL   RBC 4.95 3.87 - 5.11 MIL/uL   Hemoglobin 14.2 12.0 - 15.0 g/dL   HCT 43.0 36.0 - 46.0 %   MCV 86.9 78.0 - 100.0 fL   MCH 28.7 26.0 - 34.0 pg   MCHC 33.0 30.0 - 36.0 g/dL   RDW 13.6 11.5 - 15.5 %   Platelets 214 150 - 400 K/uL   Neutrophils Relative % 70 %   Neutro Abs 7.4 1.7 - 7.7 K/uL   Lymphocytes Relative 21 %   Lymphs Abs 2.3 0.7 - 4.0 K/uL   Monocytes Relative 7 %   Monocytes Absolute 0.8 0.1 - 1.0 K/uL   Eosinophils Relative 2 %   Eosinophils Absolute 0.2 0.0 - 0.7 K/uL   Basophils Relative 0 %   Basophils Absolute 0.0 0.0 - 0.1 K/uL  Comprehensive metabolic panel     Status: Abnormal   Collection Time: 02/26/16  9:20 PM  Result Value Ref Range   Sodium 138 135 - 145 mmol/L   Potassium 3.6 3.5 - 5.1 mmol/L   Chloride 105 101 - 111 mmol/L   CO2 24 22 - 32 mmol/L   Glucose, Bld 101 (H) 65 - 99 mg/dL   BUN 7 6 - 20 mg/dL   Creatinine, Ser 0.77 0.44 - 1.00 mg/dL   Calcium 8.7 (L) 8.9 - 10.3 mg/dL   Total Protein 7.9 6.5 - 8.1 g/dL   Albumin 4.2 3.5 - 5.0 g/dL   AST 44 (H) 15 - 41 U/L   ALT 76 (H) 14 - 54 U/L    Alkaline Phosphatase 106 38 - 126 U/L   Total Bilirubin 0.7 0.3 - 1.2 mg/dL   GFR calc non Af Amer >60 >60 mL/min   GFR calc Af Amer >60 >60 mL/min   Anion gap 9 5 - 15  Lactic acid, plasma     Status: None   Collection Time: 02/26/16  9:20 PM  Result Value Ref Range  Lactic Acid, Venous 0.8 0.5 - 2.0 mmol/L  Culture, blood (routine x 2)     Status: None (Preliminary result)   Collection Time: 02/26/16  9:20 PM  Result Value Ref Range   Specimen Description BLOOD LEFT ARM    Special Requests BOTTLES DRAWN AEROBIC ONLY 6CC    Culture PENDING    Report Status PENDING   Lactic acid, plasma     Status: None   Collection Time: 02/26/16 11:46 PM  Result Value Ref Range   Lactic Acid, Venous 0.7 0.5 - 2.0 mmol/L     MDM  2345 consult with admitting MD and she will see the patient in the ED. Patient has had first dose of Vancomycin 1 gram IV. Morphine for pain. Stable to await admitting MD.   Final diagnoses:  Cellulitis of right upper extremity  [place] recluse spider bite, accidental or unintentional, subsequent encounter       [facility], NP 02/27/16 [id number]  Merryl Hacker, MD 02/27/16 [phone]

## 2016-02-26 NOTE — ED Notes (Signed)
Spoke to patient regarding wait time. Explained we will get patient back to room as soon as possible. Patient no longer would speak to writer or make eye contact.

## 2016-02-27 ENCOUNTER — Encounter (HOSPITAL_COMMUNITY): Payer: Self-pay | Admitting: *Deleted

## 2016-02-27 DIAGNOSIS — T63331A Toxic effect of venom of brown recluse spider, accidental (unintentional), initial encounter: Secondary | ICD-10-CM | POA: Diagnosis present

## 2016-02-27 DIAGNOSIS — T63331D Toxic effect of venom of brown recluse spider, accidental (unintentional), subsequent encounter: Secondary | ICD-10-CM | POA: Diagnosis not present

## 2016-02-27 DIAGNOSIS — L0390 Cellulitis, unspecified: Secondary | ICD-10-CM | POA: Diagnosis present

## 2016-02-27 HISTORY — DX: Toxic effect of venom of brown recluse spider, accidental (unintentional), initial encounter: T63.331A

## 2016-02-27 LAB — LACTIC ACID, PLASMA: Lactic Acid, Venous: 0.7 mmol/L (ref 0.5–2.0)

## 2016-02-27 LAB — HCG, SERUM, QUALITATIVE: PREG SERUM: NEGATIVE

## 2016-02-27 MED ORDER — ENOXAPARIN SODIUM 40 MG/0.4ML ~~LOC~~ SOLN
40.0000 mg | SUBCUTANEOUS | Status: DC
  Administered 2016-02-27: 40 mg via SUBCUTANEOUS
  Filled 2016-02-27: qty 0.4

## 2016-02-27 MED ORDER — MORPHINE SULFATE (PF) 2 MG/ML IV SOLN
2.0000 mg | INTRAVENOUS | Status: DC | PRN
  Administered 2016-02-27: 2 mg via INTRAVENOUS
  Filled 2016-02-27: qty 1

## 2016-02-27 MED ORDER — OXYCODONE-ACETAMINOPHEN 7.5-325 MG PO TABS
2.0000 | ORAL_TABLET | ORAL | Status: DC | PRN
  Administered 2016-02-27 – 2016-02-29 (×10): 2 via ORAL
  Filled 2016-02-27 (×10): qty 2

## 2016-02-27 MED ORDER — SODIUM CHLORIDE 0.9 % IV SOLN
INTRAVENOUS | Status: DC
  Administered 2016-02-27: 06:00:00 via INTRAVENOUS

## 2016-02-27 MED ORDER — ESCITALOPRAM OXALATE 10 MG PO TABS
20.0000 mg | ORAL_TABLET | Freq: Every day | ORAL | Status: DC
  Administered 2016-02-27 – 2016-03-01 (×4): 20 mg via ORAL
  Filled 2016-02-27 (×4): qty 2
  Filled 2016-02-27: qty 1

## 2016-02-27 MED ORDER — MORPHINE SULFATE (PF) 2 MG/ML IV SOLN
2.0000 mg | Freq: Once | INTRAVENOUS | Status: AC
  Administered 2016-02-27: 2 mg via INTRAVENOUS
  Filled 2016-02-27: qty 1

## 2016-02-27 MED ORDER — INFLUENZA VAC SPLIT QUAD 0.5 ML IM SUSY
0.5000 mL | PREFILLED_SYRINGE | INTRAMUSCULAR | Status: AC
  Administered 2016-02-28: 0.5 mL via INTRAMUSCULAR
  Filled 2016-02-27 (×2): qty 0.5

## 2016-02-27 MED ORDER — BUSPIRONE HCL 5 MG PO TABS
10.0000 mg | ORAL_TABLET | Freq: Two times a day (BID) | ORAL | Status: DC
  Administered 2016-02-27 – 2016-03-02 (×9): 10 mg via ORAL
  Filled 2016-02-27: qty 1
  Filled 2016-02-27 (×3): qty 2
  Filled 2016-02-27: qty 1
  Filled 2016-02-27 (×7): qty 2
  Filled 2016-02-27: qty 1

## 2016-02-27 MED ORDER — MORPHINE SULFATE (PF) 2 MG/ML IV SOLN: 2.0000 mg | INTRAVENOUS | Status: DC | PRN

## 2016-02-27 MED ORDER — ONDANSETRON HCL 4 MG/2ML IJ SOLN: 4.0000 mg | Freq: Four times a day (QID) | INTRAMUSCULAR | Status: DC | PRN

## 2016-02-27 MED ORDER — ONDANSETRON HCL 4 MG PO TABS
4.0000 mg | ORAL_TABLET | Freq: Four times a day (QID) | ORAL | Status: DC | PRN
  Administered 2016-02-27 – 2016-02-29 (×2): 4 mg via ORAL
  Filled 2016-02-27 (×2): qty 1

## 2016-02-27 MED ORDER — VANCOMYCIN HCL IN DEXTROSE 1-5 GM/200ML-% IV SOLN
1000.0000 mg | Freq: Once | INTRAVENOUS | Status: AC
  Administered 2016-02-27: 1000 mg via INTRAVENOUS
  Filled 2016-02-27 (×2): qty 200

## 2016-02-27 MED ORDER — ENOXAPARIN SODIUM 60 MG/0.6ML ~~LOC~~ SOLN
50.0000 mg | SUBCUTANEOUS | Status: DC
  Administered 2016-02-28 – 2016-03-01 (×3): 50 mg via SUBCUTANEOUS
  Filled 2016-02-27 (×3): qty 0.6

## 2016-02-27 MED ORDER — NICOTINE 21 MG/24HR TD PT24
21.0000 mg | MEDICATED_PATCH | Freq: Every day | TRANSDERMAL | Status: DC
  Administered 2016-02-27 – 2016-03-01 (×4): 21 mg via TRANSDERMAL
  Filled 2016-02-27 (×4): qty 1

## 2016-02-27 MED ORDER — VANCOMYCIN HCL IN DEXTROSE 1-5 GM/200ML-% IV SOLN
1000.0000 mg | Freq: Three times a day (TID) | INTRAVENOUS | Status: DC
  Administered 2016-02-27 – 2016-03-02 (×12): 1000 mg via INTRAVENOUS
  Filled 2016-02-27 (×15): qty 200

## 2016-02-27 MED ORDER — ONDANSETRON HCL 4 MG/2ML IJ SOLN
4.0000 mg | Freq: Four times a day (QID) | INTRAMUSCULAR | Status: DC | PRN
  Administered 2016-02-29 – 2016-03-02 (×5): 4 mg via INTRAVENOUS
  Filled 2016-02-27 (×7): qty 2

## 2016-02-27 MED ORDER — HYDROMORPHONE HCL 1 MG/ML IJ SOLN: 1.0000 mg | INTRAMUSCULAR | Status: DC | PRN

## 2016-02-27 MED ORDER — ALPRAZOLAM 1 MG PO TABS
1.0000 mg | ORAL_TABLET | Freq: Two times a day (BID) | ORAL | Status: DC | PRN
  Administered 2016-02-27 – 2016-03-01 (×5): 1 mg via ORAL
  Filled 2016-02-27 (×5): qty 1

## 2016-02-27 NOTE — Progress Notes (Signed)
ANTIBIOTIC CONSULT NOTE  Pharmacy Consult for vancomycin Indication: cellulitis/ spider bite reaction  Allergies  Allergen Reactions  . Cinnamon Swelling and Other (See Comments)    REACTION: throat swells  . Bee Venom Swelling and Other (See Comments)    REACTION: All over body swelling  . Vicodin [Hydrocodone-Acetaminophen] Nausea And Vomiting  . Dilaudid [Hydromorphone Hcl] Hives  . Toradol [Ketorolac Tromethamine]     Hives, and chest pain   Patient Measurements: Height: 5\' 8"  (172.7 cm) Weight: 230 lb (104.327 kg) (patient gave her weight) IBW/kg (Calculated) : 63.9  Vital Signs: Temp: 98.2 F (36.8 C) (03/28 0541) Temp Source: Oral (03/28 0541) BP: 122/58 mmHg (03/28 0541) Pulse Rate: 86 (03/28 0541)  Labs:  Recent Labs  02/25/16 1319 02/26/16 2120  WBC 10.8* 10.7*  HGB 13.4 14.2  PLT 194 214  CREATININE 0.67 0.77   Estimated Creatinine Clearance: 127.7 mL/min (by C-G formula based on Cr of 0.77).  No results for input(s): VANCOTROUGH, VANCOPEAK, VANCORANDOM, GENTTROUGH, GENTPEAK, GENTRANDOM, TOBRATROUGH, TOBRAPEAK, TOBRARND, AMIKACINPEAK, AMIKACINTROU, AMIKACIN in the last 72 hours.   Microbiology: Recent Results (from the past 720 hour(s))  Culture, blood (routine x 2)     Status: None (Preliminary result)   Collection Time: 02/26/16  9:20 PM  Result Value Ref Range Status   Specimen Description BLOOD LEFT ARM  Final   Special Requests BOTTLES DRAWN AEROBIC ONLY 6CC  Final   Culture NO GROWTH < 12 HOURS  Final   Report Status PENDING  Incomplete   Medical History: Past Medical History  Diagnosis Date  . Pregnant   . Miscarriage     pt. states shes  spotting  and wearing a tampon   Medications:  Prescriptions prior to admission  Medication Sig Dispense Refill Last Dose  . acetaminophen (TYLENOL) 500 MG tablet Take 500 mg by mouth every 6 (six) hours as needed for mild pain.   02/26/2016 at Unknown time  . ALPRAZolam (XANAX) 1 MG tablet Take 1  tablet by mouth 2 (two) times daily as needed for anxiety.   0 02/25/2016 at Unknown time  . busPIRone (BUSPAR) 10 MG tablet Take 1 tablet by mouth 2 (two) times daily.  3 02/26/2016 at morning  . escitalopram (LEXAPRO) 20 MG tablet Take 1 tablet by mouth at bedtime.    02/25/2016 at Unknown time  . sulfamethoxazole-trimethoprim (BACTRIM DS,SEPTRA DS) 800-160 MG tablet Take 1 tablet by mouth 2 (two) times daily. 14 tablet 0 02/26/2016 at morning  . traMADol (ULTRAM) 50 MG tablet Take 1 tablet (50 mg total) by mouth every 6 (six) hours as needed. 15 tablet 0    Scheduled:  . busPIRone  10 mg Oral BID  . enoxaparin (LOVENOX) injection  40 mg Subcutaneous Q24H  . escitalopram  20 mg Oral QHS  . vancomycin  1,000 mg Intravenous Q8H   Infusions:  . sodium chloride 75 mL/hr at 02/27/16 0554   Anti-infectives    Start     Dose/Rate Route Frequency Ordered Stop   02/27/16 1400  vancomycin (VANCOCIN) IVPB 1000 mg/200 mL premix     1,000 mg 200 mL/hr over 60 Minutes Intravenous Every 8 hours 02/27/16 0740     02/27/16 0630  vancomycin (VANCOCIN) IVPB 1000 mg/200 mL premix     1,000 mg 200 mL/hr over 60 Minutes Intravenous  Once 02/27/16 0417 02/27/16 0653   02/26/16 2215  vancomycin (VANCOCIN) IVPB 1000 mg/200 mL premix     1,000 mg 200 mL/hr over 60  Minutes Intravenous  Once 02/26/16 2214 02/27/16 0154     Assessment: Pt comes to ED after being bitten by a [place] recluse spider. Area black with redness spreading.  Obese, good renal fxn.  Goal of Therapy:  Vancomycin trough level 10-15 mcg/ml  Plan:   Vancomycin 1000mg  IV q8h  Check trough at steady state  Monitor labs, renal fxn, and c/s  Ena Dawley, RPH 02/27/2016,12:14 PM

## 2016-02-27 NOTE — Progress Notes (Signed)
Dilaudid ordered for patient but there is allergy to this medication, paged on call MD, will follow new orders received and continue to monitor the patient.

## 2016-02-27 NOTE — Progress Notes (Signed)
ANTIBIOTIC CONSULT NOTE-Preliminary  Pharmacy Consult for vancomycin Indication: cellulitis/ spider bite reaction  Allergies  Allergen Reactions  . Cinnamon Swelling and Other (See Comments)    REACTION: throat swells  . Bee Venom Swelling and Other (See Comments)    REACTION: All over body swelling  . Vicodin [Hydrocodone-Acetaminophen] Nausea And Vomiting  . Dilaudid [Hydromorphone Hcl] Hives  . Toradol [Ketorolac Tromethamine]     Hives, and chest pain    Patient Measurements: Height: 5\' 8"  (172.7 cm) Weight: 230 lb (104.327 kg) IBW/kg (Calculated) : 63.9 Adjusted Body Weight:   Vital Signs: Temp: 98 F (36.7 C) (03/28 0139) Temp Source: Oral (03/28 0139) BP: 131/72 mmHg (03/28 0139) Pulse Rate: 90 (03/28 0139)  Labs:  Recent Labs  02/25/16 1319 02/26/16 2120  WBC 10.8* 10.7*  HGB 13.4 14.2  PLT 194 214  CREATININE 0.67 0.77    Estimated Creatinine Clearance: 127.7 mL/min (by C-G formula based on Cr of 0.77).  No results for input(s): VANCOTROUGH, VANCOPEAK, VANCORANDOM, GENTTROUGH, GENTPEAK, GENTRANDOM, TOBRATROUGH, TOBRAPEAK, TOBRARND, AMIKACINPEAK, AMIKACINTROU, AMIKACIN in the last 72 hours.   Microbiology: Recent Results (from the past 720 hour(s))  Culture, blood (routine x 2)     Status: None (Preliminary result)   Collection Time: 02/26/16  9:20 PM  Result Value Ref Range Status   Specimen Description BLOOD LEFT ARM  Final   Special Requests BOTTLES DRAWN AEROBIC ONLY [place]  Final   Culture PENDING  Incomplete   Report Status PENDING  Incomplete    Medical History: Past Medical History  Diagnosis Date  . Pregnant   . Miscarriage     pt. states shes  spotting  and wearing a tampon    Medications:   (Not in a hospital admission) Scheduled:  . busPIRone  10 mg Oral BID  . escitalopram  20 mg Oral QHS   Infusions:  . sodium chloride    . vancomycin     Anti-infectives    Start     Dose/Rate Route Frequency Ordered Stop   02/27/16 0630   vancomycin (VANCOCIN) IVPB 1000 mg/200 mL premix     1,000 mg 200 mL/hr over 60 Minutes Intravenous  Once 02/27/16 0417     02/26/16 2215  vancomycin (VANCOCIN) IVPB 1000 mg/200 mL premix     1,000 mg 200 mL/hr over 60 Minutes Intravenous  Once 02/26/16 2214 02/27/16 0154      Assessment: Pt comes to ED after being bitten by a [place] recluse spider. Area black with redness spreading.   Goal of Therapy:  Vancomycin trough level 10-15 mcg/ml  Plan:  Preliminary review of pertinent patient information completed.  Protocol will be initiated with a one-time dose(s) of vancomycin 1000 mg to be given 8 hours after first dose given in ED at 2227 .  Forestine Na clinical pharmacist will complete review during morning rounds to assess patient and finalize treatment regimen.  Javonta Gronau, [place], RPH 02/27/2016,4:18 AM

## 2016-02-27 NOTE — Consult Note (Signed)
Reason for Consult: Gwendolyn Glass recluse spider bite, right shoulder Referring Physician: Hospitalist  Gwendolyn Glass is an 35 y.o. female.  HPI: Patient is a 35 year old white female who 2 days ago states she had a [place] recluse spider bite to the right shoulder. She was initially seen in the emergency room. She presented back to the emergency room due to worsening pain at the site. She had a tattoo approximately 2 months ago in the same region.  Past Medical History  Diagnosis Date  . Pregnant   . Miscarriage     pt. states shes  spotting  and wearing a tampon    Past Surgical History  Procedure Laterality Date  . Appendectomy    . Cesarean section    . Cholecystectomy    . Cholecystectomy  07/28/2012    Procedure: LAPAROSCOPIC CHOLECYSTECTOMY;  Surgeon: Donato Heinz, MD;  Location: [facility];  Service: General;  Laterality: N/A;  . Removal of lt fallopian tube    . Laparoscopy N/A 09/07/2014    Procedure: LAPAROSCOPY OPERATIVE;  Surgeon: Woodroe Mode, MD;  Location: [facility];  Service: Gynecology;  Laterality: N/A;  . Unilateral salpingectomy Right 09/07/2014    Procedure: UNILATERAL SALPINGECTOMY;  Surgeon: Woodroe Mode, MD;  Location: [facility];  Service: Gynecology;  Laterality: Right;    History reviewed. No pertinent family history.  Social History:  reports that she has been smoking Cigarettes.  She has been smoking about 0.50 packs per day. She does not have any smokeless tobacco history on file. She reports that she drinks alcohol. She reports that she does not use illicit drugs.  Allergies:  Allergies  Allergen Reactions  . Cinnamon Swelling and Other (See Comments)    REACTION: throat swells  . Bee Venom Swelling and Other (See Comments)    REACTION: All over body swelling  . Vicodin [Hydrocodone-Acetaminophen] Nausea And Vomiting  . Dilaudid [Hydromorphone Hcl] Hives  . Toradol [Ketorolac Tromethamine]     Hives, and chest pain    Medications: I have reviewed the  patient's current medications.  Results for orders placed or performed during the hospital encounter of 02/26/16 (from the past 48 hour(s))  CBC with Differential     Status: Abnormal   Collection Time: 02/26/16  9:20 PM  Result Value Ref Range   WBC 10.7 (H) 4.0 - 10.5 K/uL   RBC 4.95 3.87 - 5.11 MIL/uL   Hemoglobin 14.2 12.0 - 15.0 g/dL   HCT 43.0 36.0 - 46.0 %   MCV 86.9 78.0 - 100.0 fL   MCH 28.7 26.0 - 34.0 pg   MCHC 33.0 30.0 - 36.0 g/dL   RDW 13.6 11.5 - 15.5 %   Platelets 214 150 - 400 K/uL   Neutrophils Relative % 70 %   Neutro Abs 7.4 1.7 - 7.7 K/uL   Lymphocytes Relative 21 %   Lymphs Abs 2.3 0.7 - 4.0 K/uL   Monocytes Relative 7 %   Monocytes Absolute 0.8 0.1 - 1.0 K/uL   Eosinophils Relative 2 %   Eosinophils Absolute 0.2 0.0 - 0.7 K/uL   Basophils Relative 0 %   Basophils Absolute 0.0 0.0 - 0.1 K/uL  Comprehensive metabolic panel     Status: Abnormal   Collection Time: 02/26/16  9:20 PM  Result Value Ref Range   Sodium 138 135 - 145 mmol/L   Potassium 3.6 3.5 - 5.1 mmol/L   Chloride 105 101 - 111 mmol/L   CO2 24 22 - 32 mmol/L  Glucose, Bld 101 (H) 65 - 99 mg/dL   BUN 7 6 - 20 mg/dL   Creatinine, Ser 0.77 0.44 - 1.00 mg/dL   Calcium 8.7 (L) 8.9 - 10.3 mg/dL   Total Protein 7.9 6.5 - 8.1 g/dL   Albumin 4.2 3.5 - 5.0 g/dL   AST 44 (H) 15 - 41 U/L   ALT 76 (H) 14 - 54 U/L   Alkaline Phosphatase 106 38 - 126 U/L   Total Bilirubin 0.7 0.3 - 1.2 mg/dL   GFR calc non Af Amer >60 >60 mL/min   GFR calc Af Amer >60 >60 mL/min    Comment: (NOTE) The eGFR has been calculated using the CKD EPI equation. This calculation has not been validated in all clinical situations. eGFR's persistently <60 mL/min signify possible Chronic Kidney Disease.    Anion gap 9 5 - 15  Lactic acid, plasma     Status: None   Collection Time: 02/26/16  9:20 PM  Result Value Ref Range   Lactic Acid, Venous 0.8 0.5 - 2.0 mmol/L  Culture, blood (routine x 2)     Status: None (Preliminary  result)   Collection Time: 02/26/16  9:20 PM  Result Value Ref Range   Specimen Description BLOOD LEFT ARM    Special Requests BOTTLES DRAWN AEROBIC ONLY 6CC    Culture NO GROWTH < 12 HOURS    Report Status PENDING   Lactic acid, plasma     Status: None   Collection Time: 02/26/16 11:46 PM  Result Value Ref Range   Lactic Acid, Venous 0.7 0.5 - 2.0 mmol/L    No results found.  ROS: See chart Blood pressure 122/58, pulse 86, temperature 98.2 F (36.8 C), temperature source Oral, resp. rate 20, height 5' 8" (1.727 m), weight 104.327 kg (230 lb), last menstrual period 02/11/2016, SpO2 95 %, unknown if currently breastfeeding. Physical Exam: Pleasant white female in no acute distress. Skin examination reveals an excoriated area over the right shoulder. Some eschar is present with mild erythema surrounding this region. This appears to be localized to the skin itself. She does have good range of motion in the right shoulder. There is a tattoo of an elephant in this region.  Assessment/Plan: Impression: Arthropod bite, or [place] recluse spider, right shoulder Plan: We'll take patient to the operating room tomorrow in a.m. for debridement of arthropod right of the right shoulder. I did tell her that a portion of her tattoo may be involved in the debridement area. The risks and benefits of the procedure were fully explained to the patient, who gave informed consent  , A 02/27/2016, 1:40 PM

## 2016-02-27 NOTE — Progress Notes (Addendum)
Subjective: Patient admitted this morning, see detailed H&P by Dr. Shanon Brow  35 yo female healthy comes in tonight for the second time after a [place] recluse bite. Pt just redid her patio area, she was sitting on her new patio yesterday with the old wood next to her. She swatted away a spider on her right shoulder which she identified as a [place] recluse spider. It was not painful after first but became painful and came to ED yesterday. She was placed on po bactrim. Today the pain has worsened, the area has become more red, swollen with a black area where the bite was, the redness is spreading. She called her PCP office who instructed her to come to ED. Pt referred for admission for possible worsening cellulitis  This morning patient feels better, but still complains of pain. Morphine does not for pain for long time. Filed Vitals:   02/27/16 0139 02/27/16 0541  BP: 131/72 122/58  Pulse: 90 86  Temp: 98 F (36.7 C) 98.2 F (36.8 C)  Resp: 19 20    Chest: Clear Bilaterally Heart : S1S2 RRR Abdomen: Soft, nontender Ext : No edema. Area of erythema with central necrosis noted above right shoulder. Tenderness to palpation. Fluctuant Neuro: Alert, oriented x 3  A/P  Cellulitis- ? Abscess- continue with IV vancomycin. Will consult orthopedic surgery for possible incision and drainage. Will discontinue morphine and start Percocet 7.5/325 mg 2 tablets every 4 hours when necessary for pain  DVT prophylaxis- Lovenox    Oswald Hillock [facility] [phone]

## 2016-02-27 NOTE — Progress Notes (Signed)
Report given to [facility]. Pt stable at this time.

## 2016-02-27 NOTE — H&P (Signed)
PCP:   Tonye Becket   Chief Complaint:  Spider bite  HPI: 35 yo female healthy comes in tonight for the second time after a [place] recluse bite.  Pt just redid her patio area, she was sitting on her new patio yesterday with the old wood next to her.  She swatted away a spider on her right shoulder which she identified as a [place] recluse spider.  It was not painful after first but became painful and came to ED yesterday.  She was placed on po bactrim.  Today the pain has worsened, the area has become more red, swollen with a black area where the bite was, the redness is spreading.  She called her PCP office who instructed her to come to ED.  Pt referred for admission for possible worsening cellulitis.  Review of Systems:  Positive and negative as per HPI otherwise all other systems are negative  Past Medical History: Past Medical History  Diagnosis Date  . Pregnant   . Miscarriage     pt. states shes  spotting  and wearing a tampon   Past Surgical History  Procedure Laterality Date  . Appendectomy    . Cesarean section    . Cholecystectomy    . Cholecystectomy  07/28/2012    Procedure: LAPAROSCOPIC CHOLECYSTECTOMY;  Surgeon: Donato Heinz, MD;  Location: [facility];  Service: General;  Laterality: N/A;  . Removal of lt fallopian tube    . Laparoscopy N/A 09/07/2014    Procedure: LAPAROSCOPY OPERATIVE;  Surgeon: Woodroe Mode, MD;  Location: [facility];  Service: Gynecology;  Laterality: N/A;  . Unilateral salpingectomy Right 09/07/2014    Procedure: UNILATERAL SALPINGECTOMY;  Surgeon: Woodroe Mode, MD;  Location: [facility];  Service: Gynecology;  Laterality: Right;    Medications: Prior to Admission medications   Medication Sig Start Date End Date Taking? Authorizing Provider  acetaminophen (TYLENOL) 500 MG tablet Take 500 mg by mouth every 6 (six) hours as needed for mild pain.   Yes Historical Provider, MD  ALPRAZolam Duanne Moron) 1 MG tablet Take 1 tablet by mouth 2 (two) times daily as  needed for anxiety.  02/17/16  Yes Historical Provider, MD  busPIRone (BUSPAR) 10 MG tablet Take 1 tablet by mouth 2 (two) times daily. 02/10/16  Yes Historical Provider, MD  escitalopram (LEXAPRO) 20 MG tablet Take 1 tablet by mouth at bedtime.  02/10/16  Yes Historical Provider, MD  sulfamethoxazole-trimethoprim (BACTRIM DS,SEPTRA DS) 800-160 MG tablet Take 1 tablet by mouth 2 (two) times daily. 02/25/16 03/03/16 Yes Jola Schmidt, MD  traMADol (ULTRAM) 50 MG tablet Take 1 tablet (50 mg total) by mouth every 6 (six) hours as needed. 02/25/16   Jola Schmidt, MD    Allergies:   Allergies  Allergen Reactions  . Cinnamon Swelling and Other (See Comments)    REACTION: throat swells  . Bee Venom Swelling and Other (See Comments)    REACTION: All over body swelling  . Vicodin [Hydrocodone-Acetaminophen] Nausea And Vomiting  . Dilaudid [Hydromorphone Hcl] Hives  . Toradol [Ketorolac Tromethamine]     Hives, and chest pain    Social History:  reports that she has been smoking Cigarettes.  She has been smoking about 0.50 packs per day. She does not have any smokeless tobacco history on file. She reports that she drinks alcohol. She reports that she does not use illicit drugs.  Family History: No premature CAD  Physical Exam: Filed Vitals:   02/26/16 1518 02/26/16 2200 02/27/16 0139  BP: 138/90 107/77 131/72  Pulse: 107 94 90  Temp: 98.7 F (37.1 C) 98.1 F (36.7 C) 98 F (36.7 C)  TempSrc: Oral Oral Oral  Resp: 18 20 19   Height: 5\' 8"  (1.727 m)    Weight: 104.327 kg (230 lb)    SpO2: 99% 100% 95%   General appearance: alert, cooperative and no distress Head: Normocephalic, without obvious abnormality, atraumatic Eyes: negative Nose: Nares normal. Septum midline. Mucosa normal. No drainage or sinus tenderness. Neck: no JVD and supple, symmetrical, trachea midline Lungs: clear to auscultation bilaterally Heart: regular rate and rhythm, S1, S2 normal, no murmur, click, rub or  gallop Abdomen: soft, non-tender; bowel sounds normal; no masses,  no organomegaly Extremities: extremities normal, atraumatic, no cyanosis or edema Pulses: 2+ and symmetric Skin: right shoulder with small area less than 0.5cm of necrotic tissue with surround erythema approximately 4cm circumferintial area with extension over shoulder onto upper chest wall c/w cellulitis Neurologic: Grossly normal    Labs on Admission:   Recent Labs  02/25/16 1319 02/26/16 2120  NA 139 138  K 3.9 3.6  CL 110 105  CO2 22 24  GLUCOSE 107* 101*  BUN 8 7  CREATININE 0.67 0.77  CALCIUM 9.0 8.7*    Recent Labs  02/25/16 1319 02/26/16 2120  AST 28 44*  ALT 37 76*  ALKPHOS 85 106  BILITOT 0.4 0.7  PROT 7.2 7.9  ALBUMIN 4.0 4.2     Recent Labs  02/25/16 1319 02/26/16 2120  WBC 10.8* 10.7*  NEUTROABS 8.3* 7.4  HGB 13.4 14.2  HCT 40.8 43.0  MCV 86.4 86.9  PLT 194 214    Radiological Exams on Admission: No results found.  Assessment/Plan  35 yo female with [place] recluse spider bite yesterday now with increased redness streaking from site and painful  Principal Problem:   Cellulitis vs reaction to spider bite - will cover with iv vancomycin and monitor.  No fever.  Area marked out to help follow clinically.  No flunctuance to signify an absess yet.   Monitor necrotic area.   Ivf.  Prn dilaudid, pt reports morphine iv not helping.   Active Problems:   [place] recluse spider bite- noted  obs on medical.  Full code.  DAVID,RACHAL A 02/27/2016, 3:36 AM

## 2016-02-28 ENCOUNTER — Encounter (HOSPITAL_COMMUNITY): Payer: Self-pay | Admitting: Anesthesiology

## 2016-02-28 ENCOUNTER — Encounter (HOSPITAL_COMMUNITY)
Admission: EM | Disposition: A | Discharge status: Home / Self Care | Payer: Self-pay | Source: Home / Self Care | Attending: Internal Medicine

## 2016-02-28 ENCOUNTER — Observation Stay (HOSPITAL_COMMUNITY): Payer: BLUE CROSS/BLUE SHIELD | Admitting: Anesthesiology

## 2016-02-28 DIAGNOSIS — T63331D Toxic effect of venom of brown recluse spider, accidental (unintentional), subsequent encounter: Secondary | ICD-10-CM | POA: Diagnosis not present

## 2016-02-28 DIAGNOSIS — T63331A Toxic effect of venom of brown recluse spider, accidental (unintentional), initial encounter: Secondary | ICD-10-CM

## 2016-02-28 DIAGNOSIS — L03113 Cellulitis of right upper limb: Secondary | ICD-10-CM | POA: Diagnosis present

## 2016-02-28 DIAGNOSIS — Z720 Tobacco use: Secondary | ICD-10-CM | POA: Diagnosis not present

## 2016-02-28 DIAGNOSIS — F418 Other specified anxiety disorders: Secondary | ICD-10-CM | POA: Diagnosis present

## 2016-02-28 DIAGNOSIS — F17210 Nicotine dependence, cigarettes, uncomplicated: Secondary | ICD-10-CM | POA: Diagnosis present

## 2016-02-28 DIAGNOSIS — N839 Noninflammatory disorder of ovary, fallopian tube and broad ligament, unspecified: Secondary | ICD-10-CM | POA: Diagnosis not present

## 2016-02-28 DIAGNOSIS — A047 Enterocolitis due to Clostridium difficile: Secondary | ICD-10-CM | POA: Diagnosis present

## 2016-02-28 DIAGNOSIS — Z6834 Body mass index (BMI) 34.0-34.9, adult: Secondary | ICD-10-CM | POA: Diagnosis not present

## 2016-02-28 DIAGNOSIS — Y92018 Other place in single-family (private) house as the place of occurrence of the external cause: Secondary | ICD-10-CM | POA: Diagnosis not present

## 2016-02-28 HISTORY — PX: WOUND DEBRIDEMENT: SHX247

## 2016-02-28 LAB — BASIC METABOLIC PANEL
Anion gap: 5 (ref 5–15)
BUN: 8 mg/dL (ref 6–20)
CALCIUM: 8.4 mg/dL — AB (ref 8.9–10.3)
CHLORIDE: 107 mmol/L (ref 101–111)
CO2: 29 mmol/L (ref 22–32)
CREATININE: 0.76 mg/dL (ref 0.44–1.00)
Glucose, Bld: 119 mg/dL — ABNORMAL HIGH (ref 65–99)
Potassium: 3.9 mmol/L (ref 3.5–5.1)
Sodium: 141 mmol/L (ref 135–145)

## 2016-02-28 SURGERY — DEBRIDEMENT, WOUND
Anesthesia: General | Laterality: Right

## 2016-02-28 MED ORDER — DIPHENHYDRAMINE HCL 50 MG/ML IJ SOLN
INTRAMUSCULAR | Status: AC
  Filled 2016-02-28: qty 1

## 2016-02-28 MED ORDER — SODIUM CHLORIDE 0.9% FLUSH
INTRAVENOUS | Status: AC
  Filled 2016-02-28: qty 10

## 2016-02-28 MED ORDER — LACTATED RINGERS IV SOLN
INTRAVENOUS | Status: DC
  Administered 2016-02-28: 10:00:00 via INTRAVENOUS

## 2016-02-28 MED ORDER — SILVER SULFADIAZINE 1 % EX CREA
TOPICAL_CREAM | CUTANEOUS | Status: DC | PRN
  Administered 2016-02-28: 1 via TOPICAL

## 2016-02-28 MED ORDER — DIPHENHYDRAMINE HCL 50 MG/ML IJ SOLN
25.0000 mg | Freq: Once | INTRAMUSCULAR | Status: AC
  Administered 2016-02-28: 25 mg via INTRAVENOUS

## 2016-02-28 MED ORDER — FENTANYL CITRATE (PF) 100 MCG/2ML IJ SOLN
25.0000 ug | INTRAMUSCULAR | Status: DC
  Administered 2016-02-28: 25 ug via INTRAVENOUS

## 2016-02-28 MED ORDER — CHLORHEXIDINE GLUCONATE 4 % EX LIQD
1.0000 "application " | Freq: Once | CUTANEOUS | Status: AC
  Administered 2016-02-28: 1 via TOPICAL
  Filled 2016-02-28: qty 15

## 2016-02-28 MED ORDER — ONDANSETRON HCL 4 MG/2ML IJ SOLN
4.0000 mg | Freq: Once | INTRAMUSCULAR | Status: AC
  Administered 2016-02-28: 4 mg via INTRAVENOUS

## 2016-02-28 MED ORDER — LIDOCAINE HCL (PF) 1 % IJ SOLN
INTRAMUSCULAR | Status: AC
  Filled 2016-02-28: qty 5

## 2016-02-28 MED ORDER — SENNOSIDES-DOCUSATE SODIUM 8.6-50 MG PO TABS: 2.0000 | ORAL_TABLET | Freq: Every evening | ORAL | Status: DC | PRN

## 2016-02-28 MED ORDER — MIDAZOLAM HCL 2 MG/2ML IJ SOLN
1.0000 mg | INTRAMUSCULAR | Status: DC | PRN
  Administered 2016-02-28: 2 mg via INTRAVENOUS

## 2016-02-28 MED ORDER — FENTANYL CITRATE (PF) 250 MCG/5ML IJ SOLN
INTRAMUSCULAR | Status: AC
  Filled 2016-02-28: qty 5

## 2016-02-28 MED ORDER — FENTANYL CITRATE (PF) 100 MCG/2ML IJ SOLN: 25.0000 ug | INTRAMUSCULAR | Status: DC | PRN

## 2016-02-28 MED ORDER — SILVER SULFADIAZINE 1 % EX CREA
TOPICAL_CREAM | Freq: Once | CUTANEOUS | Status: DC
  Filled 2016-02-28: qty 85

## 2016-02-28 MED ORDER — FENTANYL CITRATE (PF) 100 MCG/2ML IJ SOLN
INTRAMUSCULAR | Status: AC
  Filled 2016-02-28: qty 2

## 2016-02-28 MED ORDER — PROPOFOL 10 MG/ML IV BOLUS
INTRAVENOUS | Status: AC
  Filled 2016-02-28: qty 20

## 2016-02-28 MED ORDER — LACTATED RINGERS IV SOLN
INTRAVENOUS | Status: DC
  Administered 2016-02-28 – 2016-02-29 (×2): via INTRAVENOUS

## 2016-02-28 MED ORDER — ONDANSETRON HCL 4 MG/2ML IJ SOLN: 4.0000 mg | Freq: Once | INTRAMUSCULAR | Status: DC | PRN

## 2016-02-28 MED ORDER — ONDANSETRON HCL 4 MG/2ML IJ SOLN
INTRAMUSCULAR | Status: AC
  Filled 2016-02-28: qty 2

## 2016-02-28 MED ORDER — MIDAZOLAM HCL 2 MG/2ML IJ SOLN
INTRAMUSCULAR | Status: AC
  Filled 2016-02-28: qty 2

## 2016-02-28 MED ORDER — DEXAMETHASONE SODIUM PHOSPHATE 4 MG/ML IJ SOLN
4.0000 mg | Freq: Once | INTRAMUSCULAR | Status: AC
  Administered 2016-02-28: 4 mg via INTRAVENOUS

## 2016-02-28 MED ORDER — DEXAMETHASONE SODIUM PHOSPHATE 4 MG/ML IJ SOLN
INTRAMUSCULAR | Status: AC
  Filled 2016-02-28: qty 1

## 2016-02-28 MED ORDER — SODIUM CHLORIDE 0.9 % IR SOLN
Status: DC | PRN
  Administered 2016-02-28: 1000 mL

## 2016-02-28 SURGICAL SUPPLY — 20 items
BAG HAMPER (MISCELLANEOUS) ×2 IMPLANT
CLOTH BEACON ORANGE TIMEOUT ST (SAFETY) ×2 IMPLANT
COVER LIGHT HANDLE STERIS (MISCELLANEOUS) ×4 IMPLANT
DRSG TEGADERM 4X4.75 (GAUZE/BANDAGES/DRESSINGS) ×2 IMPLANT
ELECT REM PT RETURN 9FT ADLT (ELECTROSURGICAL) ×2
ELECTRODE REM PT RTRN 9FT ADLT (ELECTROSURGICAL) ×1 IMPLANT
GAUZE SPONGE 4X4 12PLY STRL (GAUZE/BANDAGES/DRESSINGS) ×2 IMPLANT
GLOVE BIOGEL M STRL SZ7.5 (GLOVE) ×2 IMPLANT
GLOVE BIOGEL PI IND STRL 7.0 (GLOVE) ×2 IMPLANT
GLOVE BIOGEL PI INDICATOR 7.0 (GLOVE) ×2
GLOVE ECLIPSE 6.5 STRL STRAW (GLOVE) ×2 IMPLANT
GLOVE SURG SS PI 7.5 STRL IVOR (GLOVE) ×2 IMPLANT
GOWN STRL REUS W/TWL LRG LVL3 (GOWN DISPOSABLE) ×4 IMPLANT
KIT ROOM TURNOVER APOR (KITS) ×2 IMPLANT
MANIFOLD NEPTUNE II (INSTRUMENTS) ×2 IMPLANT
NS IRRIG 1000ML POUR BTL (IV SOLUTION) ×2 IMPLANT
PACK MINOR (CUSTOM PROCEDURE TRAY) ×2 IMPLANT
PAD ARMBOARD 7.5X6 YLW CONV (MISCELLANEOUS) ×2 IMPLANT
SET BASIN LINEN APH (SET/KITS/TRAYS/PACK) ×2 IMPLANT
SYR BULB IRRIGATION 50ML (SYRINGE) ×2 IMPLANT

## 2016-02-28 NOTE — Op Note (Addendum)
Patient:  Gwendolyn Glass  DOB:  [birth year]  MRN:  [id number]   Preop Diagnosis:  Spider bite, right shoulder  Postop Diagnosis:  Same  Procedure:  Excisional debridement of soft tissue, right shoulder  Surgeon:  Aviva Signs, M.D.  Anes:  Gen.  Indications:  Patient is a 35 year old white female who sustained a [place] recluse spider bite to the right shoulder 2 days ago. She now presents for debridement of the skin and soft tissue over the right shoulder. The risks and benefits of the procedure were fully explained to the patient, who gave informed consent.  Procedure note:  The patient was placed the supine position. After general anesthesia was administered, the right superior shoulder was prepped and draped using usual sterile technique with Betadine. Surgical site confirmation was performed.  A necrotic full-thickness wound approximately 11/2-2 cm in size was noted along the superior aspect of the right shoulder. The skin and portion of subcutaneous tissue was sharply debrided using a curette and scissors. This was taken to healthy tissue. A bleeding was controlled using Bovie electrocautery. The wound was left open. Silvadene cream was placed into the wound. A dry sterile dressing was then applied.  All tape and needle counts were correct at the end of the procedure. The patient was awakened and transferred to PACU in stable condition.  Complications:  None  EBL:  Minimal  Specimen:  None

## 2016-02-28 NOTE — Transfer of Care (Signed)
Immediate Anesthesia Transfer of Care Note  Patient: Gwendolyn Glass  Procedure(s) Performed: Procedure(s): DEBRIDEMENT OF RIGHT SHOULDER SPIDER BITE (Right)  Patient Location: PACU  Anesthesia Type:General  Level of Consciousness: awake, alert  and oriented  Airway & Oxygen Therapy: Patient Spontanous Breathing and Patient connected to face mask oxygen  Post-op Assessment: Report given to RN  Post vital signs: Reviewed and stable  Last Vitals:  Filed Vitals:   02/28/16 0500 02/28/16 0930  BP: 122/75   Pulse: 89   Temp: 36.7 C 36.8 C  Resp: 20     Complications: No apparent anesthesia complications

## 2016-02-28 NOTE — Anesthesia Postprocedure Evaluation (Signed)
Anesthesia Post Note  Patient: Gwendolyn Glass  Procedure(s) Performed: Procedure(s) (LRB): DEBRIDEMENT OF RIGHT SHOULDER SPIDER BITE (Right)  Patient location during evaluation: PACU Anesthesia Type: General Level of consciousness: awake and alert and patient cooperative Pain management: pain level controlled Vital Signs Assessment: post-procedure vital signs reviewed and stable Respiratory status: spontaneous breathing, nonlabored ventilation and respiratory function stable Cardiovascular status: blood pressure returned to baseline Postop Assessment: no signs of nausea or vomiting Anesthetic complications: no    Last Vitals:  Filed Vitals:   02/28/16 1115 02/28/16 1130  BP: 114/64 124/81  Pulse: 86 79  Temp:    Resp: 12 21    Last Pain:  Filed Vitals:   02/28/16 1335  PainSc: 8                  Milany Geck J

## 2016-02-28 NOTE — Progress Notes (Signed)
[facility] PROGRESS NOTE  Gwendolyn Glass [id number] DOB: Dec 17, 1980 DOA: 02/26/2016 PCP: Tonye Becket    Code Status: Full code Family Communication: Discussed with husband Disposition Plan: Discharge when clinically appropriate, possibly in the next 24-48 hours   Consultants:  Gen. surgeon Dr. Arnoldo Morale  Procedures:  02/28/2016-debridement of soft tissue, right shoulder by Dr. Arnoldo Morale.  Antibiotics:  Vancomycin 02/27/16 >>  HPI/Subjective: Patient has some right shoulder soreness following the debridement. Otherwise no complaints.  Objective: Filed Vitals:   02/28/16 1130 02/28/16 1300  BP: 124/81 130/76  Pulse: 79 85  Temp:  98.2 F (36.8 C)  Resp: 21 20  Oxygen saturation 96% on room air.   Intake/Output Summary (Last 24 hours) at 02/28/16 1608 Last data filed at 02/28/16 1200  Gross per 24 hour  Intake   1320 ml  Output    510 ml  Net    810 ml   Filed Weights   02/26/16 1518 02/27/16 0541  Weight: 104.327 kg (230 lb) 104.327 kg (230 lb)    Exam:   General: Pleasant obese 35 year old woman in no acute distress.  Cardiovascular: S1, S2, no murmurs rubs or gallops.  Respiratory: Clear to auscultation bilaterally.  Abdomen: Obese, positive bowel sounds, soft, nontender, nondistended.  Musculoskeletal/extremities: Right shoulder incision site with bandage-not removed; mild surrounding erythema and edema and moderately tender to palpation. No pedal edema.    Data Reviewed: Basic Metabolic Panel:  Recent Labs Lab 02/25/16 1319 02/26/16 2120 02/28/16 0552  NA 139 138 141  K 3.9 3.6 3.9  CL 110 105 107  CO2 22 24 29   GLUCOSE 107* 101* 119*  BUN 8 7 8   CREATININE 0.67 0.77 0.76  CALCIUM 9.0 8.7* 8.4*   Liver Function Tests:  Recent Labs Lab 02/25/16 1319 02/26/16 2120  AST 28 44*  ALT 37 76*  ALKPHOS 85 106  BILITOT 0.4 0.7  PROT 7.2 7.9  ALBUMIN 4.0 4.2   No results for input(s): LIPASE, AMYLASE in the last 168  hours. No results for input(s): AMMONIA in the last 168 hours. CBC:  Recent Labs Lab 02/25/16 1319 02/26/16 2120  WBC 10.8* 10.7*  NEUTROABS 8.3* 7.4  HGB 13.4 14.2  HCT 40.8 43.0  MCV 86.4 86.9  PLT 194 214   Cardiac Enzymes: No results for input(s): CKTOTAL, CKMB, CKMBINDEX, TROPONINI in the last 168 hours. BNP (last 3 results) No results for input(s): BNP in the last 8760 hours.  ProBNP (last 3 results) No results for input(s): PROBNP in the last 8760 hours.  CBG: No results for input(s): GLUCAP in the last 168 hours.  Recent Results (from the past 240 hour(s))  Culture, blood (routine x 2)     Status: None (Preliminary result)   Collection Time: 02/26/16  9:20 PM  Result Value Ref Range Status   Specimen Description BLOOD LEFT ARM  Final   Special Requests BOTTLES DRAWN AEROBIC ONLY 6CC  Final   Culture NO GROWTH 2 DAYS  Final   Report Status PENDING  Incomplete     Studies: No results found.  Scheduled Meds: . busPIRone  10 mg Oral BID  . enoxaparin (LOVENOX) injection  50 mg Subcutaneous Q24H  . escitalopram  20 mg Oral QHS  . nicotine  21 mg Transdermal Daily  . silver sulfADIAZINE   Topical Once  . vancomycin  1,000 mg Intravenous Q8H   Continuous Infusions: . lactated ringers 50 mL/hr at 02/28/16 1253   Assessment and plan:  Principal Problem:  Cellulitis Active Problems:   [place] recluse spider bite   Morbid obesity ([place])   Tobacco abuse  -General surgeon, Dr. Arnoldo Morale was consulted. Following his evaluation, he recommended debridement of the [place] recluse spider bite of the right shoulder. Patient underwent the debridement today, successfully. Wound care recommendations per Dr. Arnoldo Morale. -Continue IV vancomycin. -Continue analgesics as needed for pain. -Order tobacco cessation counseling. Continue nicotine patch.   Time spent: 25 minutes    [facility] Pager [phone]. If 7PM-7AM, please contact night-coverage at  [email or web address], password [facility] 02/28/2016, 4:08 PM  LOS: 0 days

## 2016-02-28 NOTE — Anesthesia Procedure Notes (Signed)
Procedure Name: LMA Insertion Date/Time: 02/28/2016 10:18 AM Performed by: Tressie Stalker E Pre-anesthesia Checklist: Patient identified, Patient being monitored, Emergency Drugs available, Timeout performed and Suction available Patient Re-evaluated:Patient Re-evaluated prior to inductionOxygen Delivery Method: Circle System Utilized Preoxygenation: Pre-oxygenation with 100% oxygen Intubation Type: IV induction Ventilation: Mask ventilation without difficulty LMA: LMA inserted LMA Size: 4.0 Number of attempts: 1 Placement Confirmation: positive ETCO2 and breath sounds checked- equal and bilateral

## 2016-02-28 NOTE — Anesthesia Preprocedure Evaluation (Addendum)
Anesthesia Evaluation  Patient identified by MRN, date of birth, ID band  Reviewed: Allergy & Precautions, NPO status , Patient's Chart, lab work & pertinent test results  Airway Mallampati: II  TM Distance: >3 FB     Dental  (+) Poor Dentition, Dental Advisory Given   Pulmonary Current Smoker,    breath sounds clear to auscultation       Cardiovascular negative cardio ROS   Rhythm:Regular Rate:Normal     Neuro/Psych    GI/Hepatic neg GERD  ,  Endo/Other    Renal/GU      Musculoskeletal   Abdominal   Peds  Hematology   Anesthesia Other Findings   Reproductive/Obstetrics                            Anesthesia Physical Anesthesia Plan  ASA: I  Anesthesia Plan: General   Post-op Pain Management:    Induction: Intravenous  Airway Management Planned: LMA  Additional Equipment:   Intra-op Plan:   Post-operative Plan: Extubation in OR  Informed Consent: I have reviewed the patients History and Physical, chart, labs and discussed the procedure including the risks, benefits and alternatives for the proposed anesthesia with the patient or authorized representative who has indicated his/her understanding and acceptance.     Plan Discussed with:   Anesthesia Plan Comments:         Anesthesia Quick Evaluation

## 2016-02-29 ENCOUNTER — Inpatient Hospital Stay (HOSPITAL_COMMUNITY): Payer: BLUE CROSS/BLUE SHIELD

## 2016-02-29 ENCOUNTER — Encounter (HOSPITAL_COMMUNITY): Payer: Self-pay | Admitting: Internal Medicine

## 2016-02-29 DIAGNOSIS — A0472 Enterocolitis due to Clostridium difficile, not specified as recurrent: Secondary | ICD-10-CM | POA: Insufficient documentation

## 2016-02-29 DIAGNOSIS — R112 Nausea with vomiting, unspecified: Secondary | ICD-10-CM | POA: Diagnosis not present

## 2016-02-29 DIAGNOSIS — R197 Diarrhea, unspecified: Secondary | ICD-10-CM | POA: Insufficient documentation

## 2016-02-29 DIAGNOSIS — A047 Enterocolitis due to Clostridium difficile: Secondary | ICD-10-CM

## 2016-02-29 DIAGNOSIS — R1031 Right lower quadrant pain: Secondary | ICD-10-CM | POA: Diagnosis not present

## 2016-02-29 HISTORY — DX: Enterocolitis due to Clostridium difficile, not specified as recurrent: A04.72

## 2016-02-29 LAB — BASIC METABOLIC PANEL
Anion gap: 7 (ref 5–15)
BUN: 9 mg/dL (ref 6–20)
CO2: 26 mmol/L (ref 22–32)
CREATININE: 0.7 mg/dL (ref 0.44–1.00)
Calcium: 8.7 mg/dL — ABNORMAL LOW (ref 8.9–10.3)
Chloride: 110 mmol/L (ref 101–111)
GFR calc non Af Amer: >60 mL/min (ref >60)
GLUCOSE: 147 mg/dL — AB (ref 65–99)
Potassium: 4.2 mmol/L (ref 3.5–5.1)
Sodium: 143 mmol/L (ref 135–145)

## 2016-02-29 LAB — CBC
HCT: 37.5 % (ref 36.0–46.0)
HEMOGLOBIN: 12 g/dL (ref 12.0–15.0)
MCH: 28.2 pg (ref 26.0–34.0)
MCHC: 32 g/dL (ref 30.0–36.0)
MCV: 88 fL (ref 78.0–100.0)
PLATELETS: 188 10*3/uL (ref 150–400)
RBC: 4.26 MIL/uL (ref 3.87–5.11)
RDW: 13.1 % (ref 11.5–15.5)
WBC: 10.5 10*3/uL (ref 4.0–10.5)

## 2016-02-29 LAB — C DIFFICILE QUICK SCREEN W PCR REFLEX
C Diff antigen: POSITIVE — AB
C Diff toxin: NEGATIVE

## 2016-02-29 LAB — HEPATIC FUNCTION PANEL
ALK PHOS: 107 U/L (ref 38–126)
ALT: 202 U/L — AB (ref 14–54)
AST: 80 U/L — AB (ref 15–41)
Albumin: 3.5 g/dL (ref 3.5–5.0)
Bilirubin, Direct: <0.1 mg/dL — ABNORMAL LOW (ref 0.1–0.5)
TOTAL PROTEIN: 6.5 g/dL (ref 6.5–8.1)
Total Bilirubin: 0.4 mg/dL (ref 0.3–1.2)

## 2016-02-29 LAB — LIPASE, BLOOD: LIPASE: 45 U/L (ref 11–51)

## 2016-02-29 MED ORDER — FLUCONAZOLE 100 MG PO TABS
100.0000 mg | ORAL_TABLET | Freq: Once | ORAL | Status: AC
  Administered 2016-02-29: 100 mg via ORAL
  Filled 2016-02-29: qty 1

## 2016-02-29 MED ORDER — FENTANYL CITRATE (PF) 100 MCG/2ML IJ SOLN
25.0000 ug | INTRAMUSCULAR | Status: AC | PRN
  Administered 2016-02-29 (×2): 25 ug via INTRAVENOUS
  Filled 2016-02-29 (×2): qty 2

## 2016-02-29 MED ORDER — MORPHINE SULFATE (PF) 10 MG/ML IV SOLN
5.0000 mg | INTRAVENOUS | Status: DC | PRN
  Administered 2016-02-29 – 2016-03-02 (×8): 5 mg via INTRAVENOUS
  Filled 2016-02-29 (×9): qty 1

## 2016-02-29 MED ORDER — POTASSIUM CHLORIDE IN NACL 20-0.9 MEQ/L-% IV SOLN
INTRAVENOUS | Status: DC
  Administered 2016-02-29: 15:00:00 via INTRAVENOUS

## 2016-02-29 MED ORDER — IOHEXOL 300 MG/ML  SOLN
50.0000 mL | Freq: Once | INTRAMUSCULAR | Status: AC | PRN
  Administered 2016-02-29: 50 mL via ORAL

## 2016-02-29 MED ORDER — SILVER SULFADIAZINE 1 % EX CREA
TOPICAL_CREAM | Freq: Two times a day (BID) | CUTANEOUS | Status: DC
  Administered 2016-02-29: 22:00:00 via TOPICAL
  Administered 2016-02-29: 1 via TOPICAL
  Administered 2016-03-01 (×2): via TOPICAL
  Filled 2016-02-29: qty 85

## 2016-02-29 MED ORDER — PANTOPRAZOLE SODIUM 40 MG IV SOLR
40.0000 mg | Freq: Two times a day (BID) | INTRAVENOUS | Status: DC
  Administered 2016-02-29: 40 mg via INTRAVENOUS
  Filled 2016-02-29: qty 40

## 2016-02-29 MED ORDER — METRONIDAZOLE IN NACL 5-0.79 MG/ML-% IV SOLN
500.0000 mg | Freq: Three times a day (TID) | INTRAVENOUS | Status: DC
  Administered 2016-02-29 – 2016-03-01 (×2): 500 mg via INTRAVENOUS
  Filled 2016-02-29 (×3): qty 100

## 2016-02-29 MED ORDER — PROCHLORPERAZINE EDISYLATE 5 MG/ML IJ SOLN
5.0000 mg | INTRAMUSCULAR | Status: DC | PRN
  Administered 2016-02-29: 5 mg via INTRAVENOUS
  Filled 2016-02-29 (×2): qty 2

## 2016-02-29 MED ORDER — IOHEXOL 300 MG/ML  SOLN
100.0000 mL | Freq: Once | INTRAMUSCULAR | Status: AC | PRN
  Administered 2016-02-29: 100 mL via INTRAVENOUS

## 2016-02-29 NOTE — Progress Notes (Signed)
Patient was unable to keep her pain and nausea medication down so RN paged the MD and requested to get IV medication for the patient.  Orders received and carried out.  Patient is resting with no signs or symptoms of distress.  Will continue to monitor and report any changes.

## 2016-02-29 NOTE — Addendum Note (Signed)
Addendum  created 02/29/16 1607 by Vista Deck, CRNA   Modules edited: Notes Section   Notes Section:  File: [id number]

## 2016-02-29 NOTE — Anesthesia Postprocedure Evaluation (Signed)
Anesthesia Post Note  Patient: Gwendolyn Glass  Procedure(s) Performed: Procedure(s) (LRB): DEBRIDEMENT OF RIGHT SHOULDER SPIDER BITE (Right)  Patient location during evaluation: Nursing Unit Anesthesia Type: General Level of consciousness: awake and alert Pain management: pain level controlled Vital Signs Assessment: post-procedure vital signs reviewed and stable Respiratory status: spontaneous breathing Cardiovascular status: blood pressure returned to baseline Anesthetic complications: no    Last Vitals:  Filed Vitals:   02/29/16 0500 02/29/16 1444  BP: 136/61 145/84  Pulse: 93 95  Temp: 36.7 C 36.7 C  Resp: 18 18    Last Pain:  Filed Vitals:   02/29/16 1444  PainSc: 4                  Sunny Aguon

## 2016-02-29 NOTE — Progress Notes (Signed)
1 Day Post-Op  Subjective: Patient denies any significant incisional pain. Did have an episode of nausea earlier this morning. Feels better now.  Objective: Vital signs in last 24 hours: Temp:  [98.1 F (36.7 C)-98.3 F (36.8 C)] 98.1 F (36.7 C) (03/30 0500) Pulse Rate:  [79-93] 93 (03/30 0500) Resp:  [10-21] 18 (03/30 0500) BP: (113-136)/(61-81) 136/61 mmHg (03/30 0500) SpO2:  [94 %-100 %] 100 % (03/30 0500) Last BM Date: 02/26/16  Intake/Output from previous day: 03/29 0701 - 03/30 0700 In: 2220 [P.O.:720; I.V.:1100; IV Piggyback:400] Out: 10 [Blood:10] Intake/Output this shift:    General appearance: alert, cooperative and no distress Skin: Right shoulder wound dressing intact without significant bleeding.  Lab Results:   Recent Labs  02/26/16 2120 02/29/16 0625  WBC 10.7* 10.5  HGB 14.2 12.0  HCT 43.0 37.5  PLT 214 188   BMET  Recent Labs  02/28/16 0552 02/29/16 0625  NA 141 143  K 3.9 4.2  CL 107 110  CO2 29 26  GLUCOSE 119* 147*  BUN 8 9  CREATININE 0.76 0.70  CALCIUM 8.4* 8.7*   PT/INR No results for input(s): LABPROT, INR in the last 72 hours.  Studies/Results: No results found.  Anti-infectives: Anti-infectives    Start     Dose/Rate Route Frequency Ordered Stop   02/27/16 1400  vancomycin (VANCOCIN) IVPB 1000 mg/200 mL premix     1,000 mg 200 mL/hr over 60 Minutes Intravenous Every 8 hours 02/27/16 0740     02/27/16 0630  vancomycin (VANCOCIN) IVPB 1000 mg/200 mL premix     1,000 mg 200 mL/hr over 60 Minutes Intravenous  Once 02/27/16 0417 02/27/16 0653   02/26/16 2215  vancomycin (VANCOCIN) IVPB 1000 mg/200 mL premix     1,000 mg 200 mL/hr over 60 Minutes Intravenous  Once 02/26/16 2214 02/27/16 0154      Assessment/Plan: s/p Procedure(s): DEBRIDEMENT OF RIGHT SHOULDER SPIDER BITE Impression: Stable for discharge from surgery standpoint. She has been given instructions on how to take care of this wound with Silvadene cream twice  a day. I will see her in my office next week for follow-up. No need for outpatient antibiotic treatment.  LOS: 1 day    Aaren Atallah A 02/29/2016

## 2016-02-29 NOTE — Progress Notes (Signed)
[facility] PROGRESS NOTE  Gwendolyn Glass [id number] DOB: 1980-12-30 DOA: 02/26/2016 PCP: Tonye Becket    Code Status: Full code Family Communication: Discussed with husband on 3/29. Disposition Plan: Discharge when clinically appropriate.   Consultants:  Gen. surgeon Dr. Arnoldo Morale  Procedures:  02/28/2016-debridement of soft tissue, right shoulder by Dr. Arnoldo Morale.  Antibiotics:  Flagyl 02/29/16>>  Vancomycin 02/27/16 >>  HPI/Subjective: (Delayed entry). Patient seen earlier today. She reports (as well as the nursing staff) right upper/right lower quadrant abdominal pain, 2 episodes of nausea and vomiting overnight, and diffuse watery diarrhea 10-15 overnight.  Objective: Filed Vitals:   02/29/16 0500 02/29/16 1444  BP: 136/61 145/84  Pulse: 93 95  Temp: 98.1 F (36.7 C) 98.1 F (36.7 C)  Resp: 18 18  Oxygen saturation 96% on room air.   Intake/Output Summary (Last 24 hours) at 02/29/16 1753 Last data filed at 02/29/16 1300  Gross per 24 hour  Intake   1740 ml  Output      0 ml  Net   1740 ml   Filed Weights   02/26/16 1518 02/27/16 0541  Weight: 104.327 kg (230 lb) 104.327 kg (230 lb)    Exam:   General: Pleasant obese 35 year old womanWho appears slightly more ill than yesterday.  Cardiovascular: S1, S2, no murmurs rubs or gallops.  Respiratory: Clear to auscultation bilaterally.  Abdomen: Obese, positive bowel sounds, mild to moderate tender right upper to right lower quadrant; no significant distention.  Musculoskeletal/extremities: Right shoulder incision site with bandage-not removed; mild surrounding erythema and edema and moderately tender to palpation. No pedal edema.    Data Reviewed: Basic Metabolic Panel:  Recent Labs Lab 02/25/16 1319 02/26/16 2120 02/28/16 0552 02/29/16 0625  NA 139 138 141 143  K 3.9 3.6 3.9 4.2  CL 110 105 107 110  CO2 22 24 29 26   GLUCOSE 107* 101* 119* 147*  BUN 8 7 8 9   CREATININE 0.67 0.77  0.76 0.70  CALCIUM 9.0 8.7* 8.4* 8.7*   Liver Function Tests:  Recent Labs Lab 02/25/16 1319 02/26/16 2120 02/29/16 1535  AST 28 44* 80*  ALT 37 76* 202*  ALKPHOS 85 106 107  BILITOT 0.4 0.7 0.4  PROT 7.2 7.9 6.5  ALBUMIN 4.0 4.2 3.5    Recent Labs Lab 02/29/16 1535  LIPASE 45   No results for input(s): AMMONIA in the last 168 hours. CBC:  Recent Labs Lab 02/25/16 1319 02/26/16 2120 02/29/16 0625  WBC 10.8* 10.7* 10.5  NEUTROABS 8.3* 7.4  --   HGB 13.4 14.2 12.0  HCT 40.8 43.0 37.5  MCV 86.4 86.9 88.0  PLT 194 214 188   Cardiac Enzymes: No results for input(s): CKTOTAL, CKMB, CKMBINDEX, TROPONINI in the last 168 hours. BNP (last 3 results) No results for input(s): BNP in the last 8760 hours.  ProBNP (last 3 results) No results for input(s): PROBNP in the last 8760 hours.  CBG: No results for input(s): GLUCAP in the last 168 hours.  Recent Results (from the past 240 hour(s))  Culture, blood (routine x 2)     Status: None (Preliminary result)   Collection Time: 02/26/16  9:20 PM  Result Value Ref Range Status   Specimen Description BLOOD LEFT ARM  Final   Special Requests BOTTLES DRAWN AEROBIC ONLY 6CC  Final   Culture NO GROWTH 3 DAYS  Final   Report Status PENDING  Incomplete  C difficile quick scan w PCR reflex     Status: Abnormal  Collection Time: 02/29/16  1:20 PM  Result Value Ref Range Status   C Diff antigen POSITIVE (A) NEGATIVE Final   C Diff toxin NEGATIVE NEGATIVE Final   C Diff interpretation   Final    C. difficile present, but toxin not detected. This indicates colonization. In most cases, this does not require treatment. If patient has signs and symptoms consistent with colitis, consider treatment. Requires ENTERIC precautions.     Studies: No results found.  Scheduled Meds: . busPIRone  10 mg Oral BID  . enoxaparin (LOVENOX) injection  50 mg Subcutaneous Q24H  . escitalopram  20 mg Oral QHS  . metronidazole  500 mg Intravenous  Q8H  . nicotine  21 mg Transdermal Daily  . silver sulfADIAZINE   Topical Once  . silver sulfADIAZINE   Topical BID  . vancomycin  1,000 mg Intravenous Q8H   Continuous Infusions: . 0.9 % NaCl with KCl 20 mEq / L 75 mL/hr at 02/29/16 1520  . lactated ringers 50 mL/hr at 02/29/16 1027   Assessment and plan:  Principal Problem:   Cellulitis Active Problems:   [place] recluse spider bite   RLQ abdominal pain   Nausea with vomiting   Diarrhea   C. difficile diarrhea   C. difficile colitis   Morbid obesity ([facility])   Tobacco abuse  1. Cellulitis following [place] recluse spider bite, right shoulder. Status post debridement of the [place] recluse spider bite of the right shoulder by Dr. Arnoldo Morale on 02/28/16. 2. Nausea/vomiting/diarrhea/abdominal pain, secondary to C. difficile colitis. C. difficile PCR positive. 3. Tobacco abuse. 4. Morbid obesity.  Plan: --Patient started on analgesics and antiemetics as needed for nausea and vomiting. Protonix was initially ordered, but was discontinued in light of the positive C. difficile PCR. Gentle IV fluids were started. Her diet was downgraded on 02/29/16. -We'll start IV Flagyl. -Continue IV vancomycin for another 24-48 hours. -Order tobacco cessation counseling. Continue nicotine patch.   Time spent: 30 minutes    [facility] Pager [phone]. If 7PM-7AM, please contact night-coverage at [email or web address], password [facility] 02/29/2016, 5:53 PM  LOS: 1 day

## 2016-02-29 NOTE — Discharge Instructions (Signed)
Clean right shoulder wound with soap and water and then apply Silvadene cream twice a day.

## 2016-02-29 NOTE — Progress Notes (Signed)
Patient complaining of nausea and verbalizes that she has been vomiting for ten minutes straight.  Antiemetic administered.  Will continue to monitor and report any changes.

## 2016-03-01 ENCOUNTER — Encounter (HOSPITAL_COMMUNITY): Payer: Self-pay | Admitting: General Surgery

## 2016-03-01 DIAGNOSIS — N839 Noninflammatory disorder of ovary, fallopian tube and broad ligament, unspecified: Secondary | ICD-10-CM

## 2016-03-01 DIAGNOSIS — N838 Other noninflammatory disorders of ovary, fallopian tube and broad ligament: Secondary | ICD-10-CM

## 2016-03-01 HISTORY — DX: Other noninflammatory disorders of ovary, fallopian tube and broad ligament: N83.8

## 2016-03-01 LAB — BASIC METABOLIC PANEL
Anion gap: 6 (ref 5–15)
BUN: 9 mg/dL (ref 6–20)
CHLORIDE: 110 mmol/L (ref 101–111)
CO2: 25 mmol/L (ref 22–32)
CREATININE: 0.68 mg/dL (ref 0.44–1.00)
Calcium: 8 mg/dL — ABNORMAL LOW (ref 8.9–10.3)
GFR calc non Af Amer: >60 mL/min (ref >60)
Glucose, Bld: 155 mg/dL — ABNORMAL HIGH (ref 65–99)
POTASSIUM: 3.7 mmol/L (ref 3.5–5.1)
Sodium: 141 mmol/L (ref 135–145)

## 2016-03-01 MED ORDER — METRONIDAZOLE 500 MG PO TABS
500.0000 mg | ORAL_TABLET | Freq: Three times a day (TID) | ORAL | Status: DC
  Administered 2016-03-01 – 2016-03-02 (×2): 500 mg via ORAL
  Filled 2016-03-01 (×2): qty 1

## 2016-03-01 NOTE — Progress Notes (Signed)
[facility] PROGRESS NOTE  Gwendolyn Glass [id number] DOB: 04-26-1981 DOA: 02/26/2016 PCP: Tonye Becket    Code Status: Full code Family Communication: Discussed with mother on 03/01/16 Disposition Plan: Discharge when clinically appropriate, likely on 03/02/16   Consultants:  Gen. surgeon Dr. Arnoldo Morale  Procedures:  02/28/2016-debridement of soft tissue, right shoulder by Dr. Arnoldo Morale.  Antibiotics:  Flagyl 02/29/16>>  Vancomycin 02/27/16 >>  HPI/Subjective: Patient says that she feels better. She has no further nausea or vomiting. Her last bowel movement was last night. She has some discomfort right lower quadrant, but less than yesterday.  Objective: Filed Vitals:   02/29/16 2010 03/01/16 0500  BP: 129/73 135/79  Pulse: 83 93  Temp: 98.3 F (36.8 C) 97.8 F (36.6 C)  Resp: 20 18  Oxygen saturation 96% on room air.  No intake or output data in the 24 hours ending 03/01/16 1720 Filed Weights   02/26/16 1518 02/27/16 0541  Weight: 104.327 kg (230 lb) 104.327 kg (230 lb)    Exam:   General: Pleasant obese 35 year old woman who looks better today.  Cardiovascular: S1, S2, no murmurs rubs or gallops.  Respiratory: Clear to auscultation bilaterally.  Abdomen: Obese, positive bowel sounds, mildly tender right lower quadrant; no significant distention.  Musculoskeletal/extremities: Right shoulder incision site with tan base' no purulent drainage or malodor. No pedal edema.    Data Reviewed: Basic Metabolic Panel:  Recent Labs Lab 02/25/16 1319 02/26/16 2120 02/28/16 0552 02/29/16 0625 03/01/16 0655  NA 139 138 141 143 141  K 3.9 3.6 3.9 4.2 3.7  CL 110 105 107 110 110  CO2 22 24 29 26 25   GLUCOSE 107* 101* 119* 147* 155*  BUN 8 7 8 9 9   CREATININE 0.67 0.77 0.76 0.70 0.68  CALCIUM 9.0 8.7* 8.4* 8.7* 8.0*   Liver Function Tests:  Recent Labs Lab 02/25/16 1319 02/26/16 2120 02/29/16 1535  AST 28 44* 80*  ALT 37 76* 202*  ALKPHOS 85  106 107  BILITOT 0.4 0.7 0.4  PROT 7.2 7.9 6.5  ALBUMIN 4.0 4.2 3.5    Recent Labs Lab 02/29/16 1535  LIPASE 45   No results for input(s): AMMONIA in the last 168 hours. CBC:  Recent Labs Lab 02/25/16 1319 02/26/16 2120 02/29/16 0625  WBC 10.8* 10.7* 10.5  NEUTROABS 8.3* 7.4  --   HGB 13.4 14.2 12.0  HCT 40.8 43.0 37.5  MCV 86.4 86.9 88.0  PLT 194 214 188   Cardiac Enzymes: No results for input(s): CKTOTAL, CKMB, CKMBINDEX, TROPONINI in the last 168 hours. BNP (last 3 results) No results for input(s): BNP in the last 8760 hours.  ProBNP (last 3 results) No results for input(s): PROBNP in the last 8760 hours.  CBG: No results for input(s): GLUCAP in the last 168 hours.  Recent Results (from the past 240 hour(s))  Culture, blood (routine x 2)     Status: None (Preliminary result)   Collection Time: 02/26/16  9:20 PM  Result Value Ref Range Status   Specimen Description BLOOD LEFT ARM  Final   Special Requests BOTTLES DRAWN AEROBIC ONLY 6CC  Final   Culture NO GROWTH 4 DAYS  Final   Report Status PENDING  Incomplete  C difficile quick scan w PCR reflex     Status: Abnormal   Collection Time: 02/29/16  1:20 PM  Result Value Ref Range Status   C Diff antigen POSITIVE (A) NEGATIVE Final   C Diff toxin NEGATIVE NEGATIVE Final   C  Diff interpretation   Final    C. difficile present, but toxin not detected. This indicates colonization. In most cases, this does not require treatment. If patient has signs and symptoms consistent with colitis, consider treatment. Requires ENTERIC precautions.     Studies: Ct Abdomen Pelvis W Contrast  02/29/2016  CLINICAL DATA:  Right lower quadrant pain, vomiting, nausea since this morning. History of appendectomy, cholecystectomy, unilateral salpingectomy. EXAM: CT ABDOMEN AND PELVIS WITH CONTRAST TECHNIQUE: Multidetector CT imaging of the abdomen and pelvis was performed using the standard protocol following bolus administration of  intravenous contrast. CONTRAST:  77mL OMNIPAQUE IOHEXOL 300 MG/ML SOLN, 134mL OMNIPAQUE IOHEXOL 300 MG/ML SOLN COMPARISON:  CT abdomen dated 01/04/2016. FINDINGS: Lower chest:  No acute findings. Hepatobiliary: Status post cholecystectomy. Liver appears normal. No bile duct dilatation. Pancreas: No mass, inflammatory changes, or other significant abnormality. Spleen: Within normal limits in size and appearance. Adrenals/Urinary Tract: Adrenal glands appear normal. Kidneys are unremarkable without stone or hydronephrosis. No ureteral or bladder calculi identified. Bladder appears normal. Stomach/Bowel: Bowel is normal in caliber. No bowel wall thickening or evidence of bowel wall inflammation. Patient is status post appendectomy. Stomach appears normal. Vascular/Lymphatic: No pathologically enlarged lymph nodes. No evidence of abdominal aortic aneurysm. Reproductive: Cystic-appearing mass in the right adnexa, most suggestive of right ovarian cyst, measuring 4.2 x 2.6 cm. No free fluid or obvious inflammatory change within the adjacent right adnexa. Left adnexal regions unremarkable, presumably status post left salpingectomy and oophorectomy. Other: No free fluid or abscess collection. No free intraperitoneal air. Again noted is diastases of the midline rectus musculature with anterior protrusion of the mesenteric fat towards the superficial umbilicus. No focal bowel hernia. Musculoskeletal: No acute or suspicious osseous lesion. Superficial soft tissues are unremarkable. Evidence of recent injections sites seen within the anterior abdominal wall bilaterally. IMPRESSION: 1. Right ovarian mass, most likely cyst, measuring 4.2 x 2.6 cm. Given the right lower quadrant pain, would consider pelvic ultrasound for further characterization. No free fluid or obvious inflammatory change seen within the adjacent right adnexa. 2. Evidence of recent injections within the superficial subcutaneous soft tissues of the anterior  abdominal wall bilaterally. Recommend correlation with patient history. 3. Remainder of the abdomen and pelvis CT is unremarkable, as detailed above. No bowel obstruction or evidence of bowel wall inflammation. No renal or ureteral calculi. Status post appendectomy. Electronically Signed   By: Franki Cabot M.D.   On: 02/29/2016 18:05    Scheduled Meds: . busPIRone  10 mg Oral BID  . enoxaparin (LOVENOX) injection  50 mg Subcutaneous Q24H  . escitalopram  20 mg Oral QHS  . metronidazole  500 mg Intravenous Q8H  . nicotine  21 mg Transdermal Daily  . silver sulfADIAZINE   Topical Once  . silver sulfADIAZINE   Topical BID  . vancomycin  1,000 mg Intravenous Q8H   Continuous Infusions: . 0.9 % NaCl with KCl 20 mEq / L 75 mL/hr at 02/29/16 1520  . lactated ringers 50 mL/hr at 02/29/16 1027   Assessment and plan:  Principal Problem:   Cellulitis Active Problems:   [place] recluse spider bite   RLQ abdominal pain   Nausea with vomiting   C. difficile diarrhea   Morbid obesity (HCC)   Tobacco abuse   Ovarian mass, right  1. Cellulitis following [place] recluse spider bite, right shoulder. Status post debridement of the [place] recluse spider bite of the right shoulder by Dr. Arnoldo Morale on 02/28/16.Appears to be healing well. 2. Nausea/vomiting/diarrhea/abdominal pain,  secondary to C. difficile infection.  -C. difficile PCR antigen was positive but toxin was negative. CT revealed no obvious colitis. However, we'll continue Flagyl as the patient had profuse diarrhea prior to the diagnosis. 3. Right ovarian mass, most likely a cyst. CT of the abdomen and pelvis revealed 4.2 x 3.6 cm mass. This may have been partly the etiology of her right lower quadrant tenderness on exam. 4. Tobacco abuse. 5. Morbid obesity.  Plan: --Patient started on analgesics and antiemetics as needed for nausea and vomiting. Protonix was initially ordered, but was discontinued in light of the positive C. difficile PCR.  Gentle IV fluids were started. Her diet was downgraded on 02/29/16. -Flagyl started and will be continued, but will transition over to by mouth. -Continue IV vancomycin for another 24-48 hours; likely discontinue upon discharge. -Tobacco cessation counseling ordered. Patient was advised to stop smoking. -We'll order a pelvic ultrasound to evaluate the right ovarian mass. -Decrease IV fluids and advance diet as tolerated. Anticipate discharge tomorrow.   Time spent: 30 minutes    [facility] Pager [phone]. If 7PM-7AM, please contact night-coverage at [email or web address], password [facility] 03/01/2016, 5:20 PM  LOS: 2 days

## 2016-03-01 NOTE — Progress Notes (Signed)
ANTIBIOTIC CONSULT NOTE  Pharmacy Consult for vancomycin Indication: cellulitis/ spider bite reaction  Allergies  Allergen Reactions  . Cinnamon Swelling and Other (See Comments)    REACTION: throat swells  . Bee Venom Swelling and Other (See Comments)    REACTION: All over body swelling  . Vicodin [Hydrocodone-Acetaminophen] Nausea And Vomiting  . Dilaudid [Hydromorphone Hcl] Hives  . Toradol [Ketorolac Tromethamine]     Hives, and chest pain   Patient Measurements: Height: 5\' 8"  (172.7 cm) Weight: 230 lb (104.327 kg) (patient gave her weight) IBW/kg (Calculated) : 63.9  Vital Signs: Temp: 97.8 F (36.6 C) (03/31 0500) Temp Source: Oral (03/31 0500) BP: 135/79 mmHg (03/31 0500) Pulse Rate: 93 (03/31 0500)  Labs:  Recent Labs  02/28/16 0552 02/29/16 0625 03/01/16 0655  WBC  --  10.5  --   HGB  --  12.0  --   PLT  --  188  --   CREATININE 0.76 0.70 0.68   Estimated Creatinine Clearance: 127.7 mL/min (by C-G formula based on Cr of 0.68).  No results for input(s): VANCOTROUGH, VANCOPEAK, VANCORANDOM, GENTTROUGH, GENTPEAK, GENTRANDOM, TOBRATROUGH, TOBRAPEAK, TOBRARND, AMIKACINPEAK, AMIKACINTROU, AMIKACIN in the last 72 hours.   Microbiology: Recent Results (from the past 720 hour(s))  Culture, blood (routine x 2)     Status: None (Preliminary result)   Collection Time: 02/26/16  9:20 PM  Result Value Ref Range Status   Specimen Description BLOOD LEFT ARM  Final   Special Requests BOTTLES DRAWN AEROBIC ONLY 6CC  Final   Culture NO GROWTH 4 DAYS  Final   Report Status PENDING  Incomplete  C difficile quick scan w PCR reflex     Status: Abnormal   Collection Time: 02/29/16  1:20 PM  Result Value Ref Range Status   C Diff antigen POSITIVE (A) NEGATIVE Final   C Diff toxin NEGATIVE NEGATIVE Final   C Diff interpretation   Final    C. difficile present, but toxin not detected. This indicates colonization. In most cases, this does not require treatment. If patient has  signs and symptoms consistent with colitis, consider treatment. Requires ENTERIC precautions.   Medical History: Past Medical History  Diagnosis Date  . Pregnant   . Miscarriage     pt. states shes  spotting  and wearing a tampon  . [place] recluse spider bite 02/27/2016  . C. difficile diarrhea 02/29/2016   Medications:  Prescriptions prior to admission  Medication Sig Dispense Refill Last Dose  . acetaminophen (TYLENOL) 500 MG tablet Take 500 mg by mouth every 6 (six) hours as needed for mild pain.   02/26/2016 at Unknown time  . ALPRAZolam (XANAX) 1 MG tablet Take 1 tablet by mouth 2 (two) times daily as needed for anxiety.   0 02/25/2016 at Unknown time  . busPIRone (BUSPAR) 10 MG tablet Take 1 tablet by mouth 2 (two) times daily.  3 02/26/2016 at morning  . escitalopram (LEXAPRO) 20 MG tablet Take 1 tablet by mouth at bedtime.    02/25/2016 at Unknown time  . sulfamethoxazole-trimethoprim (BACTRIM DS,SEPTRA DS) 800-160 MG tablet Take 1 tablet by mouth 2 (two) times daily. 14 tablet 0 02/26/2016 at morning  . traMADol (ULTRAM) 50 MG tablet Take 1 tablet (50 mg total) by mouth every 6 (six) hours as needed. 15 tablet 0    Scheduled:  . busPIRone  10 mg Oral BID  . enoxaparin (LOVENOX) injection  50 mg Subcutaneous Q24H  . escitalopram  20 mg Oral QHS  .  metronidazole  500 mg Intravenous Q8H  . nicotine  21 mg Transdermal Daily  . silver sulfADIAZINE   Topical Once  . silver sulfADIAZINE   Topical BID  . vancomycin  1,000 mg Intravenous Q8H   Infusions:  . 0.9 % NaCl with KCl 20 mEq / L 75 mL/hr at 02/29/16 1520  . lactated ringers 50 mL/hr at 02/29/16 1027   Anti-infectives    Start     Dose/Rate Route Frequency Ordered Stop   02/29/16 1600  metroNIDAZOLE (FLAGYL) IVPB 500 mg     500 mg 100 mL/hr over 60 Minutes Intravenous Every 8 hours 02/29/16 1545     02/29/16 1330  fluconazole (DIFLUCAN) tablet 100 mg     100 mg Oral  Once 02/29/16 1322 02/29/16 1518   02/27/16 1400   vancomycin (VANCOCIN) IVPB 1000 mg/200 mL premix     1,000 mg 200 mL/hr over 60 Minutes Intravenous Every 8 hours 02/27/16 0740     02/27/16 0630  vancomycin (VANCOCIN) IVPB 1000 mg/200 mL premix     1,000 mg 200 mL/hr over 60 Minutes Intravenous  Once 02/27/16 0417 02/27/16 0653   02/26/16 2215  vancomycin (VANCOCIN) IVPB 1000 mg/200 mL premix     1,000 mg 200 mL/hr over 60 Minutes Intravenous  Once 02/26/16 2214 02/27/16 0154     Assessment: Pt comes to ED after being bitten by a [place] recluse spider. She is s/p debridment.  She is getting IV flagyl for C diff. Antigen positive, toxin negative but having diarrhea.  Renal function stable.    Goal of Therapy:  Vancomycin trough level 10-15 mcg/ml  Plan:   Cont Vancomycin 1000mg  IV q8h  Check trough at steady state  Monitor labs, renal fxn, and c/s  Aleesha Ringstad Poteet, RPH 03/01/2016,10:32 AM

## 2016-03-02 ENCOUNTER — Inpatient Hospital Stay (HOSPITAL_COMMUNITY): Payer: BLUE CROSS/BLUE SHIELD

## 2016-03-02 ENCOUNTER — Encounter (HOSPITAL_COMMUNITY): Payer: Self-pay | Admitting: Internal Medicine

## 2016-03-02 DIAGNOSIS — L03113 Cellulitis of right upper limb: Secondary | ICD-10-CM | POA: Diagnosis present

## 2016-03-02 DIAGNOSIS — T63331D Toxic effect of venom of brown recluse spider, accidental (unintentional), subsequent encounter: Secondary | ICD-10-CM

## 2016-03-02 LAB — CULTURE, BLOOD (ROUTINE X 2): Culture: NO GROWTH

## 2016-03-02 MED ORDER — PROMETHAZINE HCL 12.5 MG PO TABS: 12.5000 mg | ORAL_TABLET | Freq: Four times a day (QID) | ORAL | Status: DC | PRN

## 2016-03-02 MED ORDER — METRONIDAZOLE 500 MG PO TABS: 500.0000 mg | ORAL_TABLET | Freq: Three times a day (TID) | ORAL | Status: DC

## 2016-03-02 MED ORDER — SILVER SULFADIAZINE 1 % EX CREA: TOPICAL_CREAM | Freq: Two times a day (BID) | CUTANEOUS | Status: DC

## 2016-03-02 MED ORDER — OXYCODONE-ACETAMINOPHEN 5-325 MG PO TABS
1.0000 | ORAL_TABLET | ORAL | Status: DC | PRN
Start: 2016-03-02 — End: 2018-09-04

## 2016-03-02 NOTE — Progress Notes (Signed)
Patient received discharge instructions along with follow up appointments. Patient verbalized understanding of all instructions. Patient was escorted by staff via wheelchair to vehicle. Patient discharged to home in stable condition.

## 2016-03-02 NOTE — Discharge Summary (Signed)
Physician Discharge Summary  Gwendolyn Glass [id number] DOB: [birth year] DOA: 02/26/2016  PCP: Tonye Becket  Admit date: 02/26/2016 Discharge date: 03/02/2016  Time spent:  Greater than 30 minutes  Recommendations for Outpatient Follow-up:  1.  Patient was instructed on wound care of her right shoulder wound site by Dr. Arnoldo Morale.  2.  Patient was instructed to discuss with her PCP scheduling of an outpatient complete pelvic ultrasound for evaluation of the right ovarian mass , likely a cyst. 3. Recommend outpatient viral hepatitis panel testing and follow-up of her LFTs.    Discharge Diagnoses:   1. Right upper extremity /right shoulder cellulitis following [place] recluse spider bite.  -Status post debridement.  2. C. Difficile diarrhea.  3. Right ovarian 4.2 x 3.6 cm mass , possibly a complex cyst.  4. Tobacco abuse. The patient was advised to stop smoking.    5. Obesity.  6. Depression with anxiety.  7. Elevated LFT's.   Discharge Condition:  Improved.  Diet recommendation:  Heart healthy.  Filed Weights   02/26/16 1518 02/27/16 0541  Weight: 104.327 kg (230 lb) 104.327 kg (230 lb)    History of present illness:  Patient is a 35 year old woman with a history of depression with anxiety, who presented to the ED on 02/27/2016 with a chief complaint of a spider bite on her right shoulder. She was initially seen in the ED a couple days before. She was prescribed Bactrim. She was  instructed to come back to the ED if the area became more red and painful. She presented again with these symptoms and with a black area where the bite had occurred She also reported that the redness had spread. She was admitted for further evaluation and management.  Hospital Course:   1. Cellulitis right shoulder following [place] recluse spider bite.  The patient was started on IV vancomycin. Analgesics and antiemetics were ordered for nausea and vomiting. General surgeon, Dr. Arnoldo Morale was consulted.  Following his evaluation, he recommended debridement. He debridement the soft tissue of the right shoulder insect bite on 02/28/16.  He recommended dressing changes with application of Silvadene over the at the site with a gauze dressing covering the debridement site. She improved clinically and symptomatically. Vancomycin was discontinued after several days. She was afebrile and did not have a leukocytosis, so antibiotic therapy was discontinued  at discharge. She will follow-up with Dr. Arnoldo Morale as recommended.   2. C. Difficile diarrhea. Patient developed nausea /vomiting/ diarrhea/abdominal pain. She was started on Protonix empirically. Analgesics and antiemetics were continued. Serum hCG was negative. Her lipase was within normal limits. Her LFTs were marginally elevated , but she denied having a gallbladder. She did have some abdominal pain, but it was mostly right lower quadrant. CT of her abdomen and pelvis was ordered and it revealed no intra-abdominal findings , but it did reveal an ovarian mass. C. Difficile PCR was positive for the antigen, but not the toxin. Nevertheless, she was started on Flagyl due to her profuse diarrhea. Following the start of Flagyl , her nausea, vomiting, diarrhea, and abdominal pain resolved. She was discharged on 6 more days of Flagyl for total of 7 days of treatment.   3. Elevated LFTs.  Patient has a history of cholecystectomy. Her LFTs were within normal limits on admission. However, they did increase during the hospitalization. CT of her abdomen and pelvis revealed a cholecystectomy and normal liver.  At the time of discharge, her AST was 80 and her ALT was 202. Query  medication induced. Recommend outpatient viral hepatitis panel testing given that she does have tattoos.   4. Right ovarian mass.  CT of abdomen and pelvis was ordered for evaluation of her GI symptoms. It revealed a right ovarian mass, most likely a cyst, measuring 4.2 x 2.6 cm. The remainder of the  CT was unremarkable. Radiologist recommended consideration for pelvic ultrasound for further characterization The patient was symptomatically improved and wanted the pelvic ultrasound to be deferred to the outpatient setting. She was instructed to discuss this with her PCP. She voiced understanding.   5. Depression with anxiety.  The patient was restarted and maintained on her psychotropic medications. She remained stable.   6. Tobacco abuse.  The patient was advised to stop smoking.   Procedures:   02/28/2016-debridement of soft tissue , right shoulder insect bite by Dr. Arnoldo Morale.  Consultations:   General surgeon, Dr. Arnoldo Morale  Discharge Exam: Filed Vitals:   03/01/16 2213 03/02/16 0606  BP: 131/81 137/80  Pulse: 72 74  Temp: 98.4 F (36.9 C) 98.1 F (36.7 C)  Resp: 20 20    General: Pleasant obese 35 year old woman who looks better today.  Cardiovascular: S1, S2, no murmurs rubs or gallops.  Respiratory: Clear to auscultation bilaterally.  Abdomen: Obese, positive bowel sounds, mildly tender right lower quadrant; no significant distention.  Musculoskeletal/extremities: Right shoulder incision site with Silvadene covering the mucosal bed; no purulent drainage or malodor. No pedal edema.  Discharge Instructions   Discharge Instructions    Change dressing (specify)    Complete by:  As directed   Dressing change: Per Dr. Arnoldo Morale instructions.     Diet - low sodium heart healthy    Complete by:  As directed      Discharge instructions    Complete by:  As directed   Stop smoking. Take medications as prescribed. Dressing changes per Dr. Arnoldo Morale.     Discharge wound care:    Complete by:  As directed   Per Dr. Arnoldo Morale instructions.     Increase activity slowly    Complete by:  As directed           Current Discharge Medication List    START taking these medications   Details  metroNIDAZOLE (FLAGYL) 500 MG tablet Take 1 tablet (500 mg total) by mouth every 8  (eight) hours. Take as directed until completed for C. difficile infection. Qty: 18 tablet, Refills: 0    oxyCODONE-acetaminophen (ROXICET) 5-325 MG tablet Take 1 tablet by mouth every 4 (four) hours as needed for severe pain. Qty: 30 tablet, Refills: 0    promethazine (PHENERGAN) 12.5 MG tablet Take 1 tablet (12.5 mg total) by mouth every 6 (six) hours as needed for nausea or vomiting. Qty: 30 tablet, Refills: 0    silver sulfADIAZINE (SILVADENE) 1 % cream Apply topically 2 (two) times daily. Qty: 50 g, Refills: 0      CONTINUE these medications which have NOT CHANGED   Details  ALPRAZolam (XANAX) 1 MG tablet Take 1 tablet by mouth 2 (two) times daily as needed for anxiety.  Refills: 0    busPIRone (BUSPAR) 10 MG tablet Take 1 tablet by mouth 2 (two) times daily. Refills: 3    escitalopram (LEXAPRO) 20 MG tablet Take 1 tablet by mouth at bedtime.       STOP taking these medications     acetaminophen (TYLENOL) 500 MG tablet      sulfamethoxazole-trimethoprim (BACTRIM DS,SEPTRA DS) 800-160 MG tablet  traMADol (ULTRAM) 50 MG tablet        Allergies  Allergen Reactions  . Cinnamon Swelling and Other (See Comments)    REACTION: throat swells  . Bee Venom Swelling and Other (See Comments)    REACTION: All over body swelling  . Vicodin [Hydrocodone-Acetaminophen] Nausea And Vomiting  . Dilaudid [Hydromorphone Hcl] Hives  . Toradol [Ketorolac Tromethamine]     Hives, and chest pain   Follow-up Information    Follow up with Jamesetta So, MD. Schedule an appointment as soon as possible for a visit on 03/07/2016.   Specialty:  General Surgery   Contact information:   [address] [place] [id number] [phone]        The results of significant diagnostics from this hospitalization (including imaging, microbiology, ancillary and laboratory) are listed below for reference.    Significant Diagnostic Studies: Ct Abdomen Pelvis W Contrast  02/29/2016   CLINICAL DATA:  Right lower quadrant pain, vomiting, nausea since this morning. History of appendectomy, cholecystectomy, unilateral salpingectomy. EXAM: CT ABDOMEN AND PELVIS WITH CONTRAST TECHNIQUE: Multidetector CT imaging of the abdomen and pelvis was performed using the standard protocol following bolus administration of intravenous contrast. CONTRAST:  30mL OMNIPAQUE IOHEXOL 300 MG/ML SOLN, 145mL OMNIPAQUE IOHEXOL 300 MG/ML SOLN COMPARISON:  CT abdomen dated 01/04/2016. FINDINGS: Lower chest:  No acute findings. Hepatobiliary: Status post cholecystectomy. Liver appears normal. No bile duct dilatation. Pancreas: No mass, inflammatory changes, or other significant abnormality. Spleen: Within normal limits in size and appearance. Adrenals/Urinary Tract: Adrenal glands appear normal. Kidneys are unremarkable without stone or hydronephrosis. No ureteral or bladder calculi identified. Bladder appears normal. Stomach/Bowel: Bowel is normal in caliber. No bowel wall thickening or evidence of bowel wall inflammation. Patient is status post appendectomy. Stomach appears normal. Vascular/Lymphatic: No pathologically enlarged lymph nodes. No evidence of abdominal aortic aneurysm. Reproductive: Cystic-appearing mass in the right adnexa, most suggestive of right ovarian cyst, measuring 4.2 x 2.6 cm. No free fluid or obvious inflammatory change within the adjacent right adnexa. Left adnexal regions unremarkable, presumably status post left salpingectomy and oophorectomy. Other: No free fluid or abscess collection. No free intraperitoneal air. Again noted is diastases of the midline rectus musculature with anterior protrusion of the mesenteric fat towards the superficial umbilicus. No focal bowel hernia. Musculoskeletal: No acute or suspicious osseous lesion. Superficial soft tissues are unremarkable. Evidence of recent injections sites seen within the anterior abdominal wall bilaterally. IMPRESSION: 1. Right ovarian mass,  most likely cyst, measuring 4.2 x 2.6 cm. Given the right lower quadrant pain, would consider pelvic ultrasound for further characterization. No free fluid or obvious inflammatory change seen within the adjacent right adnexa. 2. Evidence of recent injections within the superficial subcutaneous soft tissues of the anterior abdominal wall bilaterally. Recommend correlation with patient history. 3. Remainder of the abdomen and pelvis CT is unremarkable, as detailed above. No bowel obstruction or evidence of bowel wall inflammation. No renal or ureteral calculi. Status post appendectomy. Electronically Signed   By: Franki Cabot M.D.   On: 02/29/2016 18:05    Microbiology: Recent Results (from the past 240 hour(s))  Culture, blood (routine x 2)     Status: None (Preliminary result)   Collection Time: 02/26/16  9:20 PM  Result Value Ref Range Status   Specimen Description BLOOD LEFT ARM  Final   Special Requests BOTTLES DRAWN AEROBIC ONLY 6CC  Final   Culture NO GROWTH 4 DAYS  Final   Report Status PENDING  Incomplete  C  difficile quick scan w PCR reflex     Status: Abnormal   Collection Time: 02/29/16  1:20 PM  Result Value Ref Range Status   C Diff antigen POSITIVE (A) NEGATIVE Final   C Diff toxin NEGATIVE NEGATIVE Final   C Diff interpretation   Final    C. difficile present, but toxin not detected. This indicates colonization. In most cases, this does not require treatment. If patient has signs and symptoms consistent with colitis, consider treatment. Requires ENTERIC precautions.     Labs: Basic Metabolic Panel:  Recent Labs Lab 02/25/16 1319 02/26/16 2120 02/28/16 0552 02/29/16 0625 03/01/16 0655  NA 139 138 141 143 141  K 3.9 3.6 3.9 4.2 3.7  CL 110 105 107 110 110  CO2 22 24 29 26 25   GLUCOSE 107* 101* 119* 147* 155*  BUN 8 7 8 9 9   CREATININE 0.67 0.77 0.76 0.70 0.68  CALCIUM 9.0 8.7* 8.4* 8.7* 8.0*   Liver Function Tests:  Recent Labs Lab 02/25/16 1319 02/26/16 2120  02/29/16 1535  AST 28 44* 80*  ALT 37 76* 202*  ALKPHOS 85 106 107  BILITOT 0.4 0.7 0.4  PROT 7.2 7.9 6.5  ALBUMIN 4.0 4.2 3.5    Recent Labs Lab 02/29/16 1535  LIPASE 45   No results for input(s): AMMONIA in the last 168 hours. CBC:  Recent Labs Lab 02/25/16 1319 02/26/16 2120 02/29/16 0625  WBC 10.8* 10.7* 10.5  NEUTROABS 8.3* 7.4  --   HGB 13.4 14.2 12.0  HCT 40.8 43.0 37.5  MCV 86.4 86.9 88.0  PLT 194 214 188   Cardiac Enzymes: No results for input(s): CKTOTAL, CKMB, CKMBINDEX, TROPONINI in the last 168 hours. BNP: BNP (last 3 results) No results for input(s): BNP in the last 8760 hours.  ProBNP (last 3 results) No results for input(s): PROBNP in the last 8760 hours.  CBG: No results for input(s): GLUCAP in the last 168 hours.     Signed:  Keyondre Hepburn MD.  [facility] 03/02/2016, 9:44 AM

## 2016-04-19 ENCOUNTER — Encounter (HOSPITAL_COMMUNITY): Payer: Self-pay | Admitting: Emergency Medicine

## 2016-04-19 DIAGNOSIS — Y929 Unspecified place or not applicable: Secondary | ICD-10-CM | POA: Insufficient documentation

## 2016-04-19 DIAGNOSIS — F17210 Nicotine dependence, cigarettes, uncomplicated: Secondary | ICD-10-CM | POA: Diagnosis not present

## 2016-04-19 DIAGNOSIS — Y999 Unspecified external cause status: Secondary | ICD-10-CM | POA: Insufficient documentation

## 2016-04-19 DIAGNOSIS — Y939 Activity, unspecified: Secondary | ICD-10-CM | POA: Diagnosis not present

## 2016-04-19 DIAGNOSIS — W57XXXA Bitten or stung by nonvenomous insect and other nonvenomous arthropods, initial encounter: Secondary | ICD-10-CM | POA: Insufficient documentation

## 2016-04-19 DIAGNOSIS — S2096XA Insect bite (nonvenomous) of unspecified parts of thorax, initial encounter: Secondary | ICD-10-CM | POA: Diagnosis present

## 2016-04-19 DIAGNOSIS — L03313 Cellulitis of chest wall: Secondary | ICD-10-CM | POA: Insufficient documentation

## 2016-04-19 NOTE — ED Notes (Signed)
Was bitten by what pt believes was a [place] recluse spider this morning.  Having redness and slight swelling to chest

## 2016-04-20 ENCOUNTER — Emergency Department (HOSPITAL_COMMUNITY)
Admission: EM | Admit: 2016-04-20 | Discharge: 2016-04-20 | Disposition: A | Discharge status: Home / Self Care | Payer: BLUE CROSS/BLUE SHIELD | Attending: Emergency Medicine | Admitting: Emergency Medicine

## 2016-04-20 DIAGNOSIS — L03313 Cellulitis of chest wall: Secondary | ICD-10-CM

## 2016-04-20 MED ORDER — DOXYCYCLINE HYCLATE 100 MG PO TABS: 100.0000 mg | ORAL_TABLET | Freq: Two times a day (BID) | ORAL | Status: DC

## 2016-04-20 NOTE — ED Notes (Signed)
Pt reports being bit by a "[place] recluse" spider at approx 5 AM. Stated she drew boarders throughout the day and has since gotten larger. Reports N/, dry mouth, episodes of chills and sweats. Oral Temp 98.3

## 2016-04-20 NOTE — ED Notes (Signed)
Pt states understanding of care given and follow up instructions.  Ambulated from ED  

## 2016-04-20 NOTE — ED Provider Notes (Signed)
CSN: [id number]     Arrival date & time 04/19/16  2308 History   First MD Initiated Contact with Patient 04/20/16 0014     Chief Complaint  Patient presents with  . Insect Bite      HPI Pt was seen at [facility]. Per pt, c/o sudden onset and persistence of constant "rash" since 0500 yesterday morning. Pt states she "woke up to see a spider crawling off my chest." Pt states her chest wall has "become more red" throughout the day today. Has been associated with "chills." Denies any other rash, no objective fevers.    Past Medical History  Diagnosis Date  . Pregnant   . Miscarriage     pt. states shes  spotting  and wearing a tampon  . [place] recluse spider bite 02/27/2016  . C. difficile diarrhea 02/29/2016  . Ovarian mass, right 03/01/2016    ? complex cyst   Past Surgical History  Procedure Laterality Date  . Appendectomy    . Cesarean section    . Cholecystectomy    . Cholecystectomy  07/28/2012    Procedure: LAPAROSCOPIC CHOLECYSTECTOMY;  Surgeon: Donato Heinz, MD;  Location: [facility];  Service: General;  Laterality: N/A;  . Removal of lt fallopian tube    . Laparoscopy N/A 09/07/2014    Procedure: LAPAROSCOPY OPERATIVE;  Surgeon: Woodroe Mode, MD;  Location: [facility];  Service: Gynecology;  Laterality: N/A;  . Unilateral salpingectomy Right 09/07/2014    Procedure: UNILATERAL SALPINGECTOMY;  Surgeon: Woodroe Mode, MD;  Location: [facility];  Service: Gynecology;  Laterality: Right;  . Wound debridement Right 02/28/2016    Procedure: DEBRIDEMENT OF RIGHT SHOULDER SPIDER BITE;  Surgeon: Aviva Signs, MD;  Location: [facility];  Service: General;  Laterality: Right;   History reviewed. No pertinent family history. Social History  Substance Use Topics  . Smoking status: Current Every Day Smoker -- 0.50 packs/day    Types: Cigarettes  . Smokeless tobacco: None  . Alcohol Use: Yes   OB History    Gravida Para Term Preterm AB TAB SAB Ectopic Multiple Living   10 2 2  7  6 1        Review  of Systems ROS: Statement: All systems negative except as marked or noted in the HPI; Constitutional: Negative for objective fever and "chills." ; ; Eyes: Negative for eye pain, redness and discharge. ; ; ENMT: Negative for ear pain, hoarseness, nasal congestion, sinus pressure and sore throat. ; ; Cardiovascular: Negative for chest pain, palpitations, diaphoresis, dyspnea and peripheral edema. ; ; Respiratory: Negative for cough, wheezing and stridor. ; ; Gastrointestinal: Negative for nausea, vomiting, diarrhea, abdominal pain, blood in stool, hematemesis, jaundice and rectal bleeding. . ; ; Genitourinary: Negative for dysuria, flank pain and hematuria. ; ; Musculoskeletal: Negative for back pain and neck pain. Negative for swelling and trauma.; ; Skin: +rash. Negative for pruritus, abrasions, blisters, bruising and skin lesion.; ; Neuro: Negative for headache, lightheadedness and neck stiffness. Negative for weakness, altered level of consciousness, altered mental status, extremity weakness, paresthesias, involuntary movement, seizure and syncope.      Allergies  Cinnamon; Bee venom; Vicodin; Dilaudid; and Toradol  Home Medications   Prior to Admission medications   Medication Sig Start Date End Date Taking? Authorizing Provider  ALPRAZolam Duanne Moron) 1 MG tablet Take 1 tablet by mouth 2 (two) times daily as needed for anxiety.  02/17/16   Historical Provider, MD  busPIRone (BUSPAR) 10 MG tablet Take 1 tablet by  mouth 2 (two) times daily. 02/10/16   Historical Provider, MD  escitalopram (LEXAPRO) 20 MG tablet Take 1 tablet by mouth at bedtime.  02/10/16   Historical Provider, MD  metroNIDAZOLE (FLAGYL) 500 MG tablet Take 1 tablet (500 mg total) by mouth every 8 (eight) hours. Take as directed until completed for C. difficile infection. 03/02/16   Rexene Alberts, MD  oxyCODONE-acetaminophen (ROXICET) 5-325 MG tablet Take 1 tablet by mouth every 4 (four) hours as needed for severe pain. 03/02/16   Rexene Alberts, MD  promethazine (PHENERGAN) 12.5 MG tablet Take 1 tablet (12.5 mg total) by mouth every 6 (six) hours as needed for nausea or vomiting. 03/02/16   Rexene Alberts, MD  silver sulfADIAZINE (SILVADENE) 1 % cream Apply topically 2 (two) times daily. 03/02/16   Rexene Alberts, MD   BP 134/91 mmHg  Pulse 110  Temp(Src) 98.6 F (37 C) (Oral)  Resp 18  Ht 5\' 8"  (1.727 m)  Wt 240 lb (108.863 kg)  BMI 36.50 kg/m2  SpO2 98%  LMP 04/08/2016 (Approximate)  Breastfeeding? No Physical Exam  0025: Physical examination:  Nursing notes reviewed; Vital signs and O2 SAT reviewed;  Constitutional: Well developed, Well nourished, Well hydrated, In no acute distress; Head:  Normocephalic, atraumatic; Eyes: EOMI, PERRL, No scleral icterus; ENMT: Mouth and pharynx normal, Mucous membranes moist; Neck: Supple, Full range of motion, No lymphadenopathy; Cardiovascular: Regular rate and rhythm, No gallop; Respiratory: Breath sounds clear & equal bilaterally, No wheezes.  Speaking full sentences with ease, Normal respiratory effort/excursion; Chest: +erythema to upper chest wall, no abscess, no open wounds. Movement normal; Abdomen: Soft, Nontender, Nondistended, Normal bowel sounds; Genitourinary: No CVA tenderness; Extremities: Pulses normal, No tenderness, No edema, No calf edema or asymmetry.; Neuro: AA&Ox3, Major CN grossly intact.  Speech clear. No gross focal motor or sensory deficits in extremities.; Skin: Color normal, Warm, Dry.   ED Course  Procedures (including critical care time) Labs Review  Imaging Review  I have personally reviewed and evaluated these images and lab results as part of my medical decision-making.   EKG Interpretation None      MDM  MDM Reviewed: previous chart, nursing note and vitals     0025:  No abscess. Tx abx, f/u PMD. Dx d/w pt.  Questions answered.  Verb understanding, agreeable to d/c home with outpt f/u.     Francine Graven, DO 04/23/16 1815

## 2016-04-20 NOTE — Discharge Instructions (Signed)
Take the prescription as directed.  Take tylenol and ibuprofen, as directed on packaging, as needed for discomfort. Apply moist heat to the area(s) of discomfort, for 15 minutes at a time, several times per day for the next few days.  Do not fall asleep on a heating pack.  Call your regular medical doctor on Monday to schedule a follow up appointment on Monday.  Return to the Emergency Department immediately if worsening.

## 2017-11-22 ENCOUNTER — Inpatient Hospital Stay
Admit: 2017-11-22 | Discharge: 2017-11-22 | Disposition: A | Discharge status: Home / Self Care | Attending: Emergency Medicine

## 2017-11-22 DIAGNOSIS — Z590 Homelessness: Secondary | ICD-10-CM

## 2017-11-22 NOTE — ED Notes (Signed)
I spoke with intake at [facility]_ they will take pt and provide shelter>

## 2017-11-22 NOTE — ED Notes (Signed)
Eyes closed respirations regular.

## 2017-11-22 NOTE — ED Triage Notes (Signed)
[place], and had a conflict with a family member, and was asked to leave with no place to go.  Found sitting against porch with a tremor, not resolved once in a warm environment.  Pt states that this tremor is related to her epilepsy, and began at 0600 when she was asked to leave.  Takes Lamictal and Keppra, both of which she state she is taking as ordered.

## 2017-11-22 NOTE — ED Notes (Signed)
Unable to obtain e_signature

## 2017-11-22 NOTE — ED Notes (Signed)
I called a cab for pt>  Pt is alert and oriented gait is steady>  She has expressed an understanding with the plan>

## 2017-11-22 NOTE — ED Triage Notes (Signed)
Meal provided, no issue feeding herself.  At this point, no acute medical issues have been identified.  The plan:  Pt will be allowed to stay in rm, until it is needed.  At that point, she will be welcome to stay in the waiting rm.  We will provide a cab pass to a shelter at the appropriate time.

## 2017-11-22 NOTE — ED Provider Notes (Addendum)
HPI   Paula Hendricks is a 36 year old female with a past medical history notable for seizure disorder who is maintained on Lamictal and Keppra who presents after getting kicked out of her cousin's house.  She states she had a conflict with her cousin and was kicked out at 6:00 a.m. this morning.  She has been sitting outside since that time.  She would get up occasionally.  She has had a fine tremor since leaving or cousin's house.  She states that this is part of her seizure disorder but is brought on by stress.  When she has a regular seizure she describes a generalized tonic-clonic seizure with incontinence of stool and urine and tongue biting.  She has not had any known incontinence of stool or urine but thinks her clothes might smell like urine.  She usually has LOC with her generalized tonic-clonic seizure but she has not had any LOC today.  She denies any change in her baseline headaches.  She denies any weakness or numbness.  She denies any head or neck injury. She has not been ill with fever, chills, cough, runny nose, chest pain, shortness of breath, palpitations, abdominal pain, nausea, vomiting, diarrhea.  She has been eating and drinking normally.  She is urinating normally.  She has no place else to go in [place].  She does have a friend in [place] who can buy her a bus ticket so she can go back to [place].  She has a neurologist in [place].  She was last seen approximately 3 months ago.  She denies use of drugs or alcohol.  She states she is allergic to Depakote but is unsure of her reaction.  She is a former smoker with a history of less than 10 pack years.  No past medical history on file.  Seizure disorder (Keppra:  A 1000 mg twice daily and Lamictal:  150 mg twice daily)    No past surgical history on file.  Tonsillectomy, ectopic pregnancy, ring finger repair on the left hand      No family history on file.    Social History     Socioeconomic History   ??? Marital status: Not on file      Spouse name: Not on file   ??? Number of children: Not on file   ??? Years of education: Not on file   ??? Highest education level: Not on file   Social Needs   ??? Financial resource strain: Not on file   ??? Food insecurity - worry: Not on file   ??? Food insecurity - inability: Not on file   ??? Transportation needs - medical: Not on file   ??? Transportation needs - non-medical: Not on file   Occupational History   ??? Not on file   Tobacco Use   ??? Smoking status: Not on file   Substance and Sexual Activity   ??? Alcohol use: Not on file   ??? Drug use: Not on file   ??? Sexual activity: Not on file   Other Topics Concern   ??? Not on file   Social History Narrative   ??? Not on file         ALLERGIES: Patient has no allergy information on record.    Review of Systems see HPI    Vitals:    11/22/17 0939 11/22/17 0945   BP: (!) (P) 154/103    Pulse: (!) (P) 111    Resp: (P) 15    Temp:  98.4 ??  F (36.9 ??C)   SpO2: (P) 99%             Physical Exam   General:  awake, alert, appropriate with exam; fine tremor when semi recumbent in the bed   HEENT: PERRLA, EOMI, normal conjuctiva  Neck: supple, nontender  Nodes:  no cervical adenopathy  OP: mucosa moist, OP clear  Lungs: clear with good aeration; no wheezes, rales or ronchi  CV: RRR without murmur  Abd:  soft, NT, no HSM, no masses, normal BS  Back: No CVA tenderness and no midline tenderness  Ext: warm, dry, well perfused without edema  Skin: no acute rash  Neuro: CN 2-12 intact, normal motor and sensation, normal gait,  normal speech, 2+= patellar reflexes  Pulses: 2+ = radial pulses            MDM    Paula Hendricks is a 36 year old female who presents after getting kicked out of her cousin's house.  She has a fine tremor however no overt seizure activity.  She is answering questions appropriately.  She denies any recent illness.  She was outside for the past 3 hr however was up and moving around and I do not think she is at risk for rhabdo.  She is not  hypothermic.  She denies any change in by mouth intake to suggest electrolyte abnormality.  She denies any nausea or vomiting. She has otherwise been well. I requested records from [place].  I do not think she requires further medical workup at this time as I think her tremor is more of a stress response.  She has been taking her medications as prescribed and there is no evidence of inter current illness.  She is contact her friend to see if a bus ticket can be arranged so she can return home to [place].    We were able to obtain records from [facility] in [place].  She was seen in September of 2018 and had no further workup at that time.  Her most recent seizure was a year prior to that.  Her most recent EEG was 11 months prior to that.  There was no change in management after that visit.    Patient has tremors have stopped spontaneously.  She has been able eat breakfast.  She is not able to get a ticket the bus for today.  She is amenable to going to a shelter.  Unfortunately they are not open until this evening.  She will remain in the ED until that time and we will provide her with a cab past to go to a shelter.    Shelter has been contacted and have space for her.  She is being discharged with a cab.    Addendum:  At the time of discharge patient requested antibiotic for broken tooth.  Her left lower 2nd molar is broken and decayed.  There is no evidence of abscess.  She was given a prescription for penicillin.    Procedures

## 2018-07-29 ENCOUNTER — Ambulatory Visit (INDEPENDENT_AMBULATORY_CARE_PROVIDER_SITE_OTHER): Payer: BLUE CROSS/BLUE SHIELD | Admitting: Advanced Practice Midwife

## 2018-07-29 ENCOUNTER — Encounter: Payer: Self-pay | Admitting: Advanced Practice Midwife

## 2018-07-29 VITALS — BP 132/96 | HR 94 | Ht 68.0 in | Wt 262.0 lb

## 2018-07-29 DIAGNOSIS — N8000 Endometriosis of the uterus, unspecified: Secondary | ICD-10-CM

## 2018-07-29 MED ORDER — MEGESTROL ACETATE 40 MG PO TABS: ORAL_TABLET | ORAL | 3 refills | Status: DC

## 2018-07-29 NOTE — Progress Notes (Signed)
[facility] Visit  Patient name: Gwendolyn Glass MRN [id number]  Date of birth: Nov 09, 1981  CC & HPI:  Gwendolyn Glass is a 37 y.o. Caucasian female presenting today for referral from PCP for heavy vaginal bleeding off and on for 2 years.  Had seen OB about it 1.5 years ago. [place] c/w adenomyosis, which pt states OB told her "it will only be a mild inconvenience to you" and did not offer treatment other than "medicine I'd have to be on for the rest of my life" or having to" take hormones forever if she had a hyst". Was not offered surgery or and IUD.  It has been much more than an inconvenience--pt has since gotten divorced and quality of life has decreased significantly d/t so much bleeding "all the time" and having to mange her life around changing clothes (only wears black now w/long shirts). Is very interested in hysterectomy. Does not have fallopian tubes d/t 2 ectopic pg, but has both ovaries.   Pertinent History Reviewed:  Medical & Surgical Hx:   Past Medical History:  Diagnosis Date  . [place]phier recluse spider bite 02/27/2016  . C. difficile diarrhea 02/29/2016  . Miscarriage    pt. states shes  spotting  and wearing a tampon  . Ovarian mass, right 03/01/2016   ? complex cyst  . Pregnant    Past Surgical History:  Procedure Laterality Date  . APPENDECTOMY    . CESAREAN SECTION    . CHOLECYSTECTOMY    . CHOLECYSTECTOMY  07/28/2012   Procedure: LAPAROSCOPIC CHOLECYSTECTOMY;  Surgeon: Donato Heinz, MD;  Location: [facility];  Service: General;  Laterality: N/A;  . LAPAROSCOPY N/A 09/07/2014   Procedure: LAPAROSCOPY OPERATIVE;  Surgeon: Woodroe Mode, MD;  Location: [facility];  Service: Gynecology;  Laterality: N/A;  . removal of lt fallopian tube    . UNILATERAL SALPINGECTOMY Right 09/07/2014   Procedure: UNILATERAL SALPINGECTOMY;  Surgeon: Woodroe Mode, MD;  Location: [facility];  Service: Gynecology;  Laterality: Right;  . WOUND DEBRIDEMENT Right 02/28/2016   Procedure: DEBRIDEMENT OF  RIGHT SHOULDER SPIDER BITE;  Surgeon: Aviva Signs, MD;  Location: [facility];  Service: General;  Laterality: Right;   History reviewed. No pertinent family history.  Current Outpatient Medications:  .  ALPRAZolam (XANAX) 1 MG tablet, Take 1 tablet by mouth 2 (two) times daily as needed for anxiety. , Disp: , Rfl: 0 .  busPIRone (BUSPAR) 10 MG tablet, Take 1 tablet by mouth 2 (two) times daily., Disp: , Rfl: 3 .  doxycycline (VIBRA-TABS) 100 MG tablet, Take 1 tablet (100 mg total) by mouth 2 (two) times daily. (Patient not taking: Reported on 07/29/2018), Disp: 20 tablet, Rfl: 0 .  escitalopram (LEXAPRO) 20 MG tablet, Take 1 tablet by mouth at bedtime. , Disp: , Rfl:  .  metroNIDAZOLE (FLAGYL) 500 MG tablet, Take 1 tablet (500 mg total) by mouth every 8 (eight) hours. Take as directed until completed for C. difficile infection. (Patient not taking: Reported on 07/29/2018), Disp: 18 tablet, Rfl: 0 .  oxyCODONE-acetaminophen (ROXICET) 5-325 MG tablet, Take 1 tablet by mouth every 4 (four) hours as needed for severe pain. (Patient not taking: Reported on 07/29/2018), Disp: 30 tablet, Rfl: 0 .  promethazine (PHENERGAN) 12.5 MG tablet, Take 1 tablet (12.5 mg total) by mouth every 6 (six) hours as needed for nausea or vomiting. (Patient not taking: Reported on 07/29/2018), Disp: 30 tablet, Rfl: 0 .  silver sulfADIAZINE (SILVADENE) 1 % cream,  Apply topically 2 (two) times daily. (Patient not taking: Reported on 07/29/2018), Disp: 50 g, Rfl: 0 Social History: Reviewed -  reports that she has been smoking cigarettes. She has been smoking about 0.50 packs per day. She uses smokeless tobacco.  Review of Systems:   Constitutional: Negative for fever and chills Eyes: Negative for visual disturbances Respiratory: Negative for shortness of breath, dyspnea Cardiovascular: Negative for chest pain or palpitations  Gastrointestinal: Negative for vomiting, diarrhea and constipation; no abdominal pain Genitourinary:  Negative for dysuria and urgency, vaginal irritation or itching Musculoskeletal: Negative for back pain, joint pain, myalgias  Neurological: Negative for dizziness and headaches    Objective Findings:    Physical Examination: Vitals:   07/29/18 1127  BP: (!) 132/96  Pulse: 94   General appearance - well appearing, and in no distress Mental status - alert, oriented to person, place, and time Chest:  Normal respiratory effort Heart - normal rate and regular rhy Musculoskeletal:  Normal range of motion without pain Extremities:  No edema    No results found for this or any previous visit (from the past 24 hour(s)).    Assessment & Plan:  A:   adenomyosis P: Offered medical mgt (megace, Liletta):  Wants a for sure treatment, so sick and tired of bleeding  See [facility] re hysterectomy.  Pt to bring [place] report from last year.  Megace until surgery.    No follow-ups on file.  Christin Fudge CNM 07/29/2018 12:25 PM

## 2018-08-06 ENCOUNTER — Ambulatory Visit (INDEPENDENT_AMBULATORY_CARE_PROVIDER_SITE_OTHER): Payer: BLUE CROSS/BLUE SHIELD | Admitting: Obstetrics & Gynecology

## 2018-08-06 ENCOUNTER — Encounter: Payer: Self-pay | Admitting: Obstetrics & Gynecology

## 2018-08-06 VITALS — BP 133/86 | HR 111 | Ht 68.0 in | Wt 263.2 lb

## 2018-08-06 DIAGNOSIS — N941 Unspecified dyspareunia: Secondary | ICD-10-CM | POA: Diagnosis not present

## 2018-08-06 DIAGNOSIS — N946 Dysmenorrhea, unspecified: Secondary | ICD-10-CM

## 2018-08-06 DIAGNOSIS — N8000 Endometriosis of the uterus, unspecified: Secondary | ICD-10-CM

## 2018-08-06 DIAGNOSIS — N938 Other specified abnormal uterine and vaginal bleeding: Secondary | ICD-10-CM | POA: Diagnosis not present

## 2018-08-06 MED ORDER — ONDANSETRON 8 MG PO TBDP
8.0000 mg | ORAL_TABLET | Freq: Three times a day (TID) | ORAL | 0 refills | Status: DC | PRN
Start: 2018-08-06 — End: 2018-09-24

## 2018-08-06 NOTE — Progress Notes (Signed)
Chief Complaint  Patient presents with  . Discuss Surgery      37 y.o. [id number] No LMP recorded. The current method of family planning is tubal ligation.  Outpatient Encounter Medications as of 08/06/2018  Medication Sig  . ALPRAZolam (XANAX) 1 MG tablet Take 1 tablet by mouth 2 (two) times daily as needed for anxiety.   . megestrol (MEGACE) 40 MG tablet Take 3/day (at the same time) for 5 days; 2/day for 5 days, then 1/day PO prn bleeding  . busPIRone (BUSPAR) 10 MG tablet Take 1 tablet by mouth 2 (two) times daily.  Marland Kitchen doxycycline (VIBRA-TABS) 100 MG tablet Take 1 tablet (100 mg total) by mouth 2 (two) times daily. (Patient not taking: Reported on 07/29/2018)  . escitalopram (LEXAPRO) 20 MG tablet Take 1 tablet by mouth at bedtime.   . metroNIDAZOLE (FLAGYL) 500 MG tablet Take 1 tablet (500 mg total) by mouth every 8 (eight) hours. Take as directed until completed for C. difficile infection. (Patient not taking: Reported on 07/29/2018)  . ondansetron (ZOFRAN ODT) 8 MG disintegrating tablet Take 1 tablet (8 mg total) by mouth every 8 (eight) hours as needed for nausea or vomiting.  Marland Kitchen oxyCODONE-acetaminophen (ROXICET) 5-325 MG tablet Take 1 tablet by mouth every 4 (four) hours as needed for severe pain. (Patient not taking: Reported on 07/29/2018)  . promethazine (PHENERGAN) 12.5 MG tablet Take 1 tablet (12.5 mg total) by mouth every 6 (six) hours as needed for nausea or vomiting. (Patient not taking: Reported on 07/29/2018)  . silver sulfADIAZINE (SILVADENE) 1 % cream Apply topically 2 (two) times daily. (Patient not taking: Reported on 07/29/2018)   No facility-administered encounter medications on file as of 08/06/2018.     Subjective Long standing irregular prolonged bleeding with pain  MARLEIGH KAYLOR is a 37 y.o. Caucasian female presenting today for referral from PCP for heavy vaginal bleeding off and on for 2 years.  Had seen OB about it 1.5 years ago. [place] c/w adenomyosis, which pt  states OB told her "it will only be a mild inconvenience to you" and did not offer treatment other than "medicine I'd have to be on for the rest of my life" or having to" take hormones forever if she had a hyst". Was not offered surgery or and IUD.  It has been much more than an inconvenience--pt has since gotten divorced and quality of life has decreased significantly d/t so much bleeding "all the time" and having to mange her life around changing clothes (only wears black now w/long shirts). Is very interested in hysterectomy. Does not have fallopian tubes d/t 2 ectopic pg, but has both ovaries Past Medical History:  Diagnosis Date  . Levels recluse spider bite 02/27/2016  . C. difficile diarrhea 02/29/2016  . Miscarriage    pt. states shes  spotting  and wearing a tampon  . Ovarian mass, right 03/01/2016   ? complex cyst  . Pregnant     Past Surgical History:  Procedure Laterality Date  . APPENDECTOMY    . CESAREAN SECTION    . CHOLECYSTECTOMY    . CHOLECYSTECTOMY  07/28/2012   Procedure: LAPAROSCOPIC CHOLECYSTECTOMY;  Surgeon: Donato Heinz, MD;  Location: [facility];  Service: General;  Laterality: N/A;  . LAPAROSCOPY N/A 09/07/2014   Procedure: LAPAROSCOPY OPERATIVE;  Surgeon: Woodroe Mode, MD;  Location: [facility];  Service: Gynecology;  Laterality: N/A;  . removal of lt fallopian tube    . UNILATERAL SALPINGECTOMY  Right 09/07/2014   Procedure: UNILATERAL SALPINGECTOMY;  Surgeon: Woodroe Mode, MD;  Location: [facility];  Service: Gynecology;  Laterality: Right;  . WOUND DEBRIDEMENT Right 02/28/2016   Procedure: DEBRIDEMENT OF RIGHT SHOULDER SPIDER BITE;  Surgeon: Aviva Signs, MD;  Location: [facility];  Service: General;  Laterality: Right;    OB History    Gravida  10   Para  2   Term  2   Preterm      AB  7   Living        SAB  6   TAB      Ectopic  1   Multiple      Live Births              Allergies  Allergen Reactions  . Cinnamon Swelling and Other (See  Comments)    REACTION: throat swells  . Bee Venom Swelling and Other (See Comments)    REACTION: All over body swelling  . Vicodin [Hydrocodone-Acetaminophen] Nausea And Vomiting  . Dilaudid [Hydromorphone Hcl] Hives  . Toradol [Ketorolac Tromethamine]     Hives, and chest pain    Social History   Socioeconomic History  . Marital status: Married    Spouse name: Not on file  . Number of children: Not on file  . Years of education: Not on file  . Highest education level: Not on file  Occupational History  . Not on file  Social Needs  . Financial resource strain: Not on file  . Food insecurity:    Worry: Not on file    Inability: Not on file  . Transportation needs:    Medical: Not on file    Non-medical: Not on file  Tobacco Use  . Smoking status: Current Every Day Smoker    Packs/day: 0.50    Types: Cigarettes  . Smokeless tobacco: Current User  Substance and Sexual Activity  . Alcohol use: Yes    Comment: occ.  . Drug use: No  . Sexual activity: Yes    Birth control/protection: None  Lifestyle  . Physical activity:    Days per week: Not on file    Minutes per session: Not on file  . Stress: Not on file  Relationships  . Social connections:    Talks on phone: Not on file    Gets together: Not on file    Attends religious service: Not on file    Active member of club or organization: Not on file    Attends meetings of clubs or organizations: Not on file    Relationship status: Not on file  Other Topics Concern  . Not on file  Social History Narrative  . Not on file    History reviewed. No pertinent family history.  Medications:       Current Outpatient Medications:  .  ALPRAZolam (XANAX) 1 MG tablet, Take 1 tablet by mouth 2 (two) times daily as needed for anxiety. , Disp: , Rfl: 0 .  megestrol (MEGACE) 40 MG tablet, Take 3/day (at the same time) for 5 days; 2/day for 5 days, then 1/day PO prn bleeding, Disp: 60 tablet, Rfl: 3 .  busPIRone (BUSPAR) 10 MG  tablet, Take 1 tablet by mouth 2 (two) times daily., Disp: , Rfl: 3 .  doxycycline (VIBRA-TABS) 100 MG tablet, Take 1 tablet (100 mg total) by mouth 2 (two) times daily. (Patient not taking: Reported on 07/29/2018), Disp: 20 tablet, Rfl: 0 .  escitalopram (LEXAPRO) 20 MG tablet, Take  1 tablet by mouth at bedtime. , Disp: , Rfl:  .  metroNIDAZOLE (FLAGYL) 500 MG tablet, Take 1 tablet (500 mg total) by mouth every 8 (eight) hours. Take as directed until completed for C. difficile infection. (Patient not taking: Reported on 07/29/2018), Disp: 18 tablet, Rfl: 0 .  ondansetron (ZOFRAN ODT) 8 MG disintegrating tablet, Take 1 tablet (8 mg total) by mouth every 8 (eight) hours as needed for nausea or vomiting., Disp: 20 tablet, Rfl: 0 .  oxyCODONE-acetaminophen (ROXICET) 5-325 MG tablet, Take 1 tablet by mouth every 4 (four) hours as needed for severe pain. (Patient not taking: Reported on 07/29/2018), Disp: 30 tablet, Rfl: 0 .  promethazine (PHENERGAN) 12.5 MG tablet, Take 1 tablet (12.5 mg total) by mouth every 6 (six) hours as needed for nausea or vomiting. (Patient not taking: Reported on 07/29/2018), Disp: 30 tablet, Rfl: 0 .  silver sulfADIAZINE (SILVADENE) 1 % cream, Apply topically 2 (two) times daily. (Patient not taking: Reported on 07/29/2018), Disp: 50 g, Rfl: 0  Objective Blood pressure 133/86, pulse (!) 111, height 5\' 8"  (1.727 m), weight 263 lb 3.2 oz (119.4 kg).  General WDWN female NAD Vulva:  normal appearing vulva with no masses, tenderness or lesions Vagina:  normal mucosa, no discharge Cervix:  no cervical motion tenderness and no lesions Uterus:  normal size, contour, position, consistency, mobility, non-tender non mobile adherent high in pelvis consistent with post C section adhesions Adnexa: ovaries:present,  normal adnexa in size, nontender and no masses   Pertinent ROS No burning with urination, frequency or urgency No nausea, vomiting or diarrhea Nor fever chills or other  constitutional symptoms   Labs or studies No new    Impression Diagnoses this Encounter::   ICD-10-CM   1. DUB (dysfunctional uterine bleeding) N93.8 US PELVIS TRANSVANGINAL NON-OB (TV ONLY)    US PELVIS (TRANSABDOMINAL ONLY)  2. Dyspareunia, bump N94.10 US PELVIS TRANSVANGINAL NON-OB (TV ONLY)    US PELVIS (TRANSABDOMINAL ONLY)  3. Uterus, adenomyosis N80.0 US PELVIS TRANSVANGINAL NON-OB (TV ONLY)    US PELVIS (TRANSABDOMINAL ONLY)  4. Dysmenorrhea N94.6 US PELVIS TRANSVANGINAL NON-OB (TV ONLY)    US PELVIS (TRANSABDOMINAL ONLY)    Established relevant diagnosis(es):   Plan/Recommendations: Meds ordered this encounter  Medications  . ondansetron (ZOFRAN ODT) 8 MG disintegrating tablet    Sig: Take 1 tablet (8 mg total) by mouth every 8 (eight) hours as needed for nausea or vomiting.    Dispense:  20 tablet    Refill:  0    Labs or Scans Ordered: Orders Placed This Encounter  Procedures  . US PELVIS TRANSVANGINAL NON-OB (TV ONLY)  . US PELVIS (TRANSABDOMINAL ONLY)    Management:: Pelvic sonogram 2 week then discuss TAH(no uterine descent in fact stuck high due to C section and need to remove cervix due to bump dyspareunia)  Follow up Return in about 2 weeks (around 08/20/2018) for GYN sono, Follow up, with Dr Elonda Husky.     All questions were answered.

## 2018-08-18 ENCOUNTER — Ambulatory Visit (INDEPENDENT_AMBULATORY_CARE_PROVIDER_SITE_OTHER): Payer: BLUE CROSS/BLUE SHIELD

## 2018-08-18 ENCOUNTER — Ambulatory Visit (INDEPENDENT_AMBULATORY_CARE_PROVIDER_SITE_OTHER): Payer: BLUE CROSS/BLUE SHIELD | Admitting: Obstetrics & Gynecology

## 2018-08-18 ENCOUNTER — Encounter: Payer: Self-pay | Admitting: Obstetrics & Gynecology

## 2018-08-18 ENCOUNTER — Other Ambulatory Visit: Payer: Self-pay

## 2018-08-18 VITALS — BP 140/89 | HR 93 | Ht 68.0 in | Wt 256.0 lb

## 2018-08-18 DIAGNOSIS — N941 Unspecified dyspareunia: Secondary | ICD-10-CM

## 2018-08-18 DIAGNOSIS — N946 Dysmenorrhea, unspecified: Secondary | ICD-10-CM

## 2018-08-18 DIAGNOSIS — N938 Other specified abnormal uterine and vaginal bleeding: Secondary | ICD-10-CM | POA: Diagnosis not present

## 2018-08-18 DIAGNOSIS — N8000 Endometriosis of the uterus, unspecified: Secondary | ICD-10-CM

## 2018-08-18 NOTE — Progress Notes (Signed)
PELVIC US TA/TV:enlarged homogeneous anteverted uterus,EEC 17.9 mm,heterogeneous endometrium,no color flow w/in endometrium,normal ovaries bilat,ovaries appear mobile,no pain during ultrasound,no free fluid

## 2018-08-18 NOTE — Progress Notes (Signed)
Follow up appointment for results  Chief Complaint  Patient presents with  . Follow-up    gyn [place]    Blood pressure 140/89, pulse 93, height 5\' 8"  (1.727 m), weight 256 lb (116.1 kg).  US Pelvis Transvanginal Non-ob (tv Only)  Result Date: 08/18/2018 GYNECOLOGIC SONOGRAM Gwendolyn Glass is a 37 y.o. [id number] unknown LMP,she is here for a pelvic sonogram for abnormal uterine bleeding. Uterus                      8.4 x 6.1 x 8 cm, vol 214 ml,enlarged homogeneous anteverted uterus Endometrium          17.9 mm, symmetrical, heterogeneous endometrium,no color flow w/in endometrium Right ovary             2.1 x 1.7 x 1.6 cm, wnl Left ovary                2.1 x 1.5 x 2 cm, wnl No free fluid,mult small, simple nabothian cysts Technician Comments: PELVIC US TA/TV:enlarged homogeneous anteverted uterus,EEC 17.9 mm,heterogeneous endometrium,no color flow w/in endometrium,normal ovaries bilat,ovaries appear mobile,no pain during ultrasound,no free fluid [employer] 08/18/2018 10:29 AM Clinical Impression and recommendations: I have reviewed the sonogram results above, combined with the patient's current clinical course, below are my impressions and any appropriate recommendations for management based on the sonographic findings. Uterus is small normal Endometrium is thickened due to decidualization from megestrol Ovaries are normal Florian Buff 08/18/2018 10:50 AM   US Pelvis (transabdominal Only)  Result Date: 08/18/2018 GYNECOLOGIC SONOGRAM Gwendolyn Glass is a 37 y.o. [id number] unknown LMP,she is here for a pelvic sonogram for abnormal uterine bleeding. Uterus                      8.4 x 6.1 x 8 cm, vol 214 ml,enlarged homogeneous anteverted uterus Endometrium          17.9 mm, symmetrical, heterogeneous endometrium,no color flow w/in endometrium Right ovary             2.1 x 1.7 x 1.6 cm, wnl Left ovary                2.1 x 1.5 x 2 cm, wnl No free fluid,mult small, simple nabothian cysts Technician Comments:  PELVIC US TA/TV:enlarged homogeneous anteverted uterus,EEC 17.9 mm,heterogeneous endometrium,no color flow w/in endometrium,normal ovaries bilat,ovaries appear mobile,no pain during ultrasound,no free fluid [employer] 08/18/2018 10:29 AM Clinical Impression and recommendations: I have reviewed the sonogram results above, combined with the patient's current clinical course, below are my impressions and any appropriate recommendations for management based on the sonographic findings. Uterus is small normal Endometrium is thickened due to decidualization from megestrol Ovaries are normal Florian Buff 08/18/2018 10:50 AM      MEDS ordered this encounter: No orders of the defined types were placed in this encounter.   Orders for this encounter: No orders of the defined types were placed in this encounter.   Impression: DUB (dysfunctional uterine bleeding)  Dysmenorrhea  Dyspareunia, bump    Plan: Recommend mini lap with abdominal hysterectomy to solve both the bleeding issues + dyspareunia, her uterus does not descend at all probably from her previous C section, she will consider ablation vs definitive therapy and contact me with her decision  Follow Up: No follow-ups on file.       Face to face time:  15 minutes  Greater than 50% of  the visit time was spent in counseling and coordination of care with the patient.  The summary and outline of the counseling and care coordination is summarized in the note above.   All questions were answered.  Past Medical History:  Diagnosis Date  . Meraz recluse spider bite 02/27/2016  . C. difficile diarrhea 02/29/2016  . Miscarriage    pt. states shes  spotting  and wearing a tampon  . Ovarian mass, right 03/01/2016   ? complex cyst  . Pregnant     Past Surgical History:  Procedure Laterality Date  . APPENDECTOMY    . CESAREAN SECTION    . CHOLECYSTECTOMY    . CHOLECYSTECTOMY  07/28/2012   Procedure: LAPAROSCOPIC CHOLECYSTECTOMY;   Surgeon: Donato Heinz, MD;  Location: [facility];  Service: General;  Laterality: N/A;  . LAPAROSCOPY N/A 09/07/2014   Procedure: LAPAROSCOPY OPERATIVE;  Surgeon: Woodroe Mode, MD;  Location: [facility];  Service: Gynecology;  Laterality: N/A;  . removal of lt fallopian tube    . UNILATERAL SALPINGECTOMY Right 09/07/2014   Procedure: UNILATERAL SALPINGECTOMY;  Surgeon: Woodroe Mode, MD;  Location: [facility];  Service: Gynecology;  Laterality: Right;  . WOUND DEBRIDEMENT Right 02/28/2016   Procedure: DEBRIDEMENT OF RIGHT SHOULDER SPIDER BITE;  Surgeon: Aviva Signs, MD;  Location: [facility];  Service: General;  Laterality: Right;    OB History    Gravida  10   Para  2   Term  2   Preterm      AB  7   Living        SAB  6   TAB      Ectopic  1   Multiple      Live Births              Allergies  Allergen Reactions  . Glass Swelling and Other (See Comments)    REACTION: throat swells  . Bee Venom Swelling and Other (See Comments)    REACTION: All over body swelling  . Vicodin [Hydrocodone-Acetaminophen] Nausea And Vomiting  . Dilaudid [Hydromorphone Hcl] Hives  . Toradol [Ketorolac Tromethamine]     Hives, and chest pain    Social History   Socioeconomic History  . Marital status: Married    Spouse name: Not on file  . Number of children: Not on file  . Years of education: Not on file  . Highest education level: Not on file  Occupational History  . Not on file  Social Needs  . Financial resource strain: Not on file  . Food insecurity:    Worry: Not on file    Inability: Not on file  . Transportation needs:    Medical: Not on file    Non-medical: Not on file  Tobacco Use  . Smoking status: Current Every Day Smoker    Packs/day: 0.50    Types: Cigarettes  . Smokeless tobacco: Current User  Substance and Sexual Activity  . Alcohol use: Yes    Comment: occ.  . Drug use: No  . Sexual activity: Yes    Birth control/protection: None  Lifestyle  .  Physical activity:    Days per week: Not on file    Minutes per session: Not on file  . Stress: Not on file  Relationships  . Social connections:    Talks on phone: Not on file    Gets together: Not on file    Attends religious service: Not on file    Active member  of club or organization: Not on file    Attends meetings of clubs or organizations: Not on file    Relationship status: Not on file  Other Topics Concern  . Not on file  Social History Narrative  . Not on file    History reviewed. No pertinent family history.

## 2018-08-28 ENCOUNTER — Telehealth: Payer: Self-pay | Admitting: Obstetrics & Gynecology

## 2018-08-28 MED ORDER — MEGESTROL ACETATE 40 MG PO TABS: ORAL_TABLET | ORAL | 3 refills | Status: DC

## 2018-08-28 NOTE — Telephone Encounter (Signed)
LMOVM that Rx for megestrol had been sent to pharmacy

## 2018-09-09 ENCOUNTER — Other Ambulatory Visit: Payer: Self-pay | Admitting: Obstetrics & Gynecology

## 2018-09-09 NOTE — Patient Instructions (Signed)
Gwendolyn Glass  09/09/2018     @PREFPERIOPPHARMACY @   Your procedure is scheduled on  09/16/2018 .  Report to [facility] at  915   A.M.  Call this number if you have problems the morning of surgery:  [phone]   Remember:  Do not eat or drink after midnight.  You may drink clear liquids until  12 midnight 09/15/2018 .  Clear liquids allowed are:                    Water, Juice (non-citric and without pulp), Carbonated beverages, Clear Tea, Black Coffee only, Plain Jell-O only, Gatorade and Plain Popsicles only    Take these medicines the morning of surgery with A SIP OF WATER  Xanax, zofran.    Do not wear jewelry, make-up or nail polish.  Do not wear lotions, powders, or perfumes, or deodorant.  Do not shave 48 hours prior to surgery.  Men may shave face and neck.  Do not bring valuables to the hospital.  [facility] is not responsible for any belongings or valuables.  Contacts, dentures or bridgework may not be worn into surgery.  Leave your suitcase in the car.  After surgery it may be brought to your room.  For patients admitted to the hospital, discharge time will be determined by your treatment team.  Patients discharged the day of surgery will not be allowed to drive home.   Name and phone number of your driver:   family Special instructions:  None  Please read over the following fact sheets that you were given. Anesthesia Post-op Instructions and Care and Recovery After Surgery       Abdominal Hysterectomy Abdominal hysterectomy is a surgery to remove your womb (uterus). The womb is the part of your body that holds a growing baby. You may need this procedure if:  You have cancer.  You have growths (tumors or fibroids) in your uterus.  You have long-term (chronic) pain.  You are bleeding.  Your womb has slipped down into your vagina.  You have a condition in which the tissue that lines the womb grows outside of its normal  place.  You have an infection in your womb.  You have problems with your period.  You may also need other reproductive parts removed. This will depend on why you need to have the surgery. What happens before the procedure? Staying hydrated Follow instructions from your doctor about hydration. This may include:  Up to 2 hours before the procedure - you may continue to drink clear liquids, such as: ? Water. ? Fruit juice. ? Black coffee. ? Plain tea.  Eating and drinking restrictions Follow instructions from your doctor about eating and drinking. These may include:  8 hours before the procedure - stop eating heavy meals or foods, such as: ? Meat. ? Fried foods. ? Fatty foods.  6 hours before the procedure - stop eating light meals or foods, such as: ? Toast. ? Cereal.  6 hours before the procedure - stop drinking: ? Milk ? Drinks that have milk in them.  2 hours before the procedure - stop drinking clear liquids.  Medicines  Ask your doctor about: ? Changing or stopping your normal medicines. This is important if you take diabetes medicines or blood thinners. ? Taking medicines such as aspirin and ibuprofen. These medicines can thin your blood. Do not take these medicines before your procedure  if your doctor tells you not to.  You may be given antibiotic medicine. This can help prevent infection.  You may be asked to take medicines that help you poop (laxatives). General instructions  Ask your doctor how your surgical site will be marked or identified.  You may be asked to shower with a germ-killing soap.  Plan to have someone take you home from the hospital.  Do not use any products that contain nicotine or tobacco, such as cigarettes and e-cigarettes. If you need help quitting, ask your doctor.  You may have an exam or tests done.  You may have a blood or urine sample taken.  You may need to have an enema to clean out your rectum and lower colon.  Talk to  your doctor about the changes this procedure may cause. These can be physical or emotional. What happens during the procedure?  To lower your risk of infection: ? Your health care team will wash or clean their hands. ? Your skin will be washed with soap. ? Hair may be removed from the surgical area.  An IV tube will be put into one of your veins.  You will be given one or more of the following: ? A medicine to help you relax (sedative). ? A medicine to make you fall asleep (general anesthetic).  Tight-fitting (compression) stockings will be placed on your legs to help with circulation.  A thin, flexible tube (catheter) will be inserted to help drain your urine.  The doctor will make a cut (incision) through the skin in your lower belly. It may go side-to-side or up-and-down.  The doctor will move the body tissues that cover your womb.  The doctor will remove your womb.  The doctor may take out any other parts that need to be removed.  The doctor will control the bleeding.  The doctor will close your cut with stitches (sutures), skin glue, or adhesive strips.  A bandage (dressing) will be placed over the cut. The procedure may vary among doctors and hospitals. What happens after the procedure?  You will be given pain medicine if you need it.  Your blood pressure, heart rate, breathing rate, and blood oxygen level will be watched until the medicines you were given have worn off.  You will need to stay in the hospital to recover. Ask your doctor how long you will need to stay in the hospital after your procedure.  You may have a liquid diet at first. You will most likely return to your usual diet the day after surgery.  You will still have the urinary catheter in place. It will likely be removed the day after surgery.  You may have to wear compression stockings. These stockings help to prevent blood clots and reduce swelling in your legs.  You will be encouraged to walk as  soon as possible. You will also use a device or do breathing exercises to keep your lungs clear.  You may need to use a sanitary pad for vaginal discharge. Summary  Abdominal hysterectomy is a surgery to remove your womb (uterus). The womb is the part of your body that holds a growing baby.  Talk to your doctor about the changes this procedure may cause. These can be physical or emotional.  You will be given pain medicine if you need it.  You will need to stay in the hospital to recover for one to two days. Ask your doctor how long you will need to stay in the  hospital after your procedure. This information is not intended to replace advice given to you by your health care provider. Make sure you discuss any questions you have with your health care provider. Document Released: 11/23/2013 Document Revised: 11/06/2016 Document Reviewed: 11/06/2016 Elsevier Interactive Patient Education  2017 [place].  Abdominal Hysterectomy, Care After This sheet gives you information about how to care for yourself after your procedure. Your doctor may also give you more specific instructions. If you have problems or questions, contact your doctor. Follow these instructions at home: Bathing  Do not take baths, swim, or use a hot tub until your doctor says it is okay. Ask your doctor if you can take showers. You may only be allowed to take sponge baths for bathing.  Keep the bandage (dressing) dry until your doctor says it can be taken off. Surgical cut ( incision) care  Follow instructions from your doctor about how to take care of your cut from surgery. Make sure you: ? Wash your hands with soap and water before you change your bandage (dressing). If you cannot use soap and water, use hand sanitizer. ? Change your bandage as told by your doctor. ? Leave stitches (sutures), skin glue, or skin tape (adhesive) strips in place. They may need to stay in place for 2 weeks or longer. If tape strips get  loose and curl up, you may trim the loose edges. Do not remove tape strips completely unless your doctor says it is okay.  Check your surgical cut area every day for signs of infection. Check for: ? Redness, swelling, or pain. ? Fluid or blood. ? Warmth. ? Pus or a bad smell. Activity  Do gentle, daily exercise as told by your doctor. You may be told to take short walks every day and go farther each time.  Do not lift anything that is heavier than 10 lb (4.5 kg), or the limit that your doctor tells you, until he or she says that it is safe.  Do not drive or use heavy machinery while taking prescription pain medicine.  Do not drive for 24 hours if you were given a medicine to help you relax (sedative).  Follow your doctor's advice about exercise, driving, and general activities. Ask your doctor what activities are safe for you. Lifestyle  Do not douche, use tampons, or have sex for at least 6 weeks or as told by your doctor.  Do not drink alcohol until your doctor says it is okay.  Drink enough fluid to keep your pee (urine) clear or pale yellow.  Try to have someone at home with you for the first 1-2 weeks to help.  Do not use any products that contain nicotine or tobacco, such as cigarettes and e-cigarettes. These can slow down healing. If you need help quitting, ask your doctor. General instructions  Take over-the-counter and prescription medicines only as told by your doctor.  Do not take aspirin or ibuprofen. These medicines can cause bleeding.  To prevent or treat constipation while you are taking prescription pain medicine, your doctor may suggest that you: ? Drink enough fluid to keep your urine clear or pale yellow. ? Take over-the-counter or prescription medicines. ? Eat foods that are high in fiber, such as:  Fresh fruits and vegetables.  Whole grains.  Beans. ? Limit foods that are high in fat and processed sugars, such as fried and sweet foods.  Keep all  follow-up visits as told by your doctor. This is important. Contact a  doctor if:  You have chills or fever.  You have redness, swelling, or pain around your cut.  You have fluid or blood coming from your cut.  Your cut feels warm to the touch.  You have pus or a bad smell coming from your cut.  Your cut breaks open.  You feel dizzy or light-headed.  You have pain or bleeding when you pee.  You keep having watery poop (diarrhea).  You keep feeling sick to your stomach (nauseous) or keep throwing up (vomiting).  You have unusual fluid (discharge) coming from your vagina.  You have a rash.  You have a reaction to your medicine.  Your pain medicine does not help. Get help right away if:  You have a fever and your symptoms get worse all of a sudden.  You have very bad belly (abdominal) pain.  You are short of breath.  You pass out (faint).  You have pain, swelling, or redness of your leg.  You bleed a lot from your vagina and notice clumps of blood (clots). Summary  Do not take baths, swim, or use a hot tub until your doctor says it is okay. Ask your doctor if you can take showers. You may only be allowed to take sponge baths for bathing.  Follow your doctor's advice about exercise, driving, and general activities. Ask your doctor what activities are safe for you.  Do not lift anything that is heavier than 10 lb (4.5 kg), or the limit that your doctor tells you, until he or she says that it is safe.  Try to have someone at home with you for the first 1-2 weeks to help. This information is not intended to replace advice given to you by your health care provider. Make sure you discuss any questions you have with your health care provider. Document Released: 08/27/2008 Document Revised: 11/06/2016 Document Reviewed: 11/06/2016 Elsevier Interactive Patient Education  2017 [place], also called tubectomy, is the surgical removal of one  of the fallopian tubes. The fallopian tubes are where eggs travel from the ovaries to the uterus. Removing one fallopian tube does not prevent you from becoming pregnant. It also does not cause problems with your menstrual periods. You may need a salpingectomy if you:  Have a fertilized egg that attaches to the fallopian tube (ectopic pregnancy), especially one that causes the tube to burst or tear (rupture).  Have an infected fallopian tube.  Have cancer of the fallopian tube or nearby organs.  Have had an ovary removed due to a cyst or tumor.  Have had your uterus removed.  There are three different methods that can be used for a salpingectomy:  Open. This method involves making one large incision in your abdomen.  Laparoscopic. This method involves using a thin, lighted tube with a tiny camera on the end (laparoscope) to help perform the procedure. The laparoscope will allow your surgeon to make several small incisions in the abdomen instead of a large incision.  Robot-assisted: This method involves using a computer to control surgical instruments that are attached to robotic arms.  Tell a health care provider about:  Any allergies you have.  All medicines you are taking, including vitamins, herbs, eye drops, creams, and over-the-counter medicines.  Any problems you or family members have had with anesthetic medicines.  Any blood disorders you have.  Any surgeries you have had.  Any medical conditions you have.  Whether you are pregnant or may be pregnant. What are  the risks? Generally, this is a safe procedure. However, problems may occur, including:  Infection.  Bleeding.  Allergic reactions to medicines.  Damage to other structures or organs.  Blood clots in the legs or lungs.  What happens before the procedure? Staying hydrated Follow instructions from your health care provider about hydration, which may include:  Up to 2 hours before the procedure - you may  continue to drink clear liquids, such as water, clear fruit juice, black coffee, and plain tea.  Eating and drinking restrictions Follow instructions from your health care provider about eating and drinking, which may include:  8 hours before the procedure - stop eating heavy meals or foods such as meat, fried foods, or fatty foods.  6 hours before the procedure - stop eating light meals or foods, such as toast or cereal.  6 hours before the procedure - stop drinking milk or drinks that contain milk.  2 hours before the procedure - stop drinking clear liquids.  Medicines  Ask your health care provider about: ? Changing or stopping your regular medicines. This is especially important if you are taking diabetes medicines or blood thinners. ? Taking medicines such as aspirin and ibuprofen. These medicines can thin your blood. Do not take these medicines before your procedure if your health care provider instructs you not to.  You may be given antibiotic medicine to help prevent infection. General instructions  Do not smoke for at least 2 weeks before your procedure. If you need help quitting, ask your health care provider.  You may have an exam or tests, such as an electrocardiogram (ECG).  You may have a blood or urine sample taken.  Ask your health care provider: ? Whether you should stop removing hair from your surgical area. ? How your surgical site will be marked or identified.  You may be asked to shower with a germ-killing soap.  Plan to have someone take you home from the hospital or clinic.  If you will be going home right after the procedure, plan to have someone with you for 24 hours. What happens during the procedure?  To reduce your risk of infection: ? Your health care team will wash or sanitize their hands. ? Hair may be removed from the surgical area. ? Your skin will be washed with soap.  An IV tube will be inserted into one of your veins.  You will be  given a medicine to make you fall asleep (general anesthetic). You may also be given a medicine to help you relax (sedative).  A thin tube (catheter) may be inserted through your urethra and into your bladder to drain urine during your procedure.  Depending on the type of procedure you are having, one incision or several small incisions will be made in your abdomen.  Your fallopian tube will be cut and removed from where it attaches to your uterus.  Your blood vessels will be clamped and tied to prevent excess bleeding.  The incision(s) in your abdomen will be closed with stitches (sutures), staples, or skin glue.  A bandage (dressing) may be placed over your incision(s). The procedure may vary among health care providers and hospitals. What happens after the procedure?  Your blood pressure, heart rate, breathing rate, and blood oxygen level will be monitored until the medicines you were given have worn off.  You may continue to receive fluids and medicines through an IV tube.  You may continue to have a catheter draining your urine.  You  may have to wear compression stockings. These stockings help to prevent blood clots and reduce swelling in your legs.  You will be given pain medicine as needed.  Do not drive for 24 hours if you received a sedative. Summary  Salpingectomy is a surgical procedure to remove one of the fallopian tubes.  The procedure may be done with an open incision, with a laparoscope, or with computer-controlled instruments.  Depending on the type of procedure you are having, one incision or several small incisions will be made in your abdomen.  Your blood pressure, heart rate, breathing rate, and blood oxygen level will be monitored until the medicines you were given have worn off.  Plan to have someone take you home from the hospital or clinic. This information is not intended to replace advice given to you by your health care provider. Make sure you discuss  any questions you have with your health care provider. Document Released: 04/06/2009 Document Revised: 07/05/2016 Document Reviewed: 05/12/2013 Elsevier Interactive Patient Education  2018 [place] Anesthesia, Adult General anesthesia is the use of medicines to make a person "go to sleep" (be unconscious) for a medical procedure. General anesthesia is often recommended when a procedure:  Is long.  Requires you to be still or in an unusual position.  Is major and can cause you to lose blood.  Is impossible to do without general anesthesia.  The medicines used for general anesthesia are called general anesthetics. In addition to making you sleep, the medicines:  Prevent pain.  Control your blood pressure.  Relax your muscles.  Tell a health care provider about:  Any allergies you have.  All medicines you are taking, including vitamins, herbs, eye drops, creams, and over-the-counter medicines.  Any problems you or family members have had with anesthetic medicines.  Types of anesthetics you have had in the past.  Any bleeding disorders you have.  Any surgeries you have had.  Any medical conditions you have.  Any history of heart or lung conditions, such as heart failure, sleep apnea, or chronic obstructive pulmonary disease (COPD).  Whether you are pregnant or may be pregnant.  Whether you use tobacco, alcohol, marijuana, or street drugs.  Any history of Armed forces logistics/support/administrative officer.  Any history of depression or anxiety. What are the risks? Generally, this is a safe procedure. However, problems may occur, including:  Allergic reaction to anesthetics.  Lung and heart problems.  Inhaling food or liquids from your stomach into your lungs (aspiration).  Injury to nerves.  Waking up during your procedure and being unable to move (rare).  Extreme agitation or a state of mental confusion (delirium) when you wake up from the anesthetic.  Air in the bloodstream,  which can lead to stroke.  These problems are more likely to develop if you are having a major surgery or if you have an advanced medical condition. You can prevent some of these complications by answering all of your health care provider's questions thoroughly and by following all pre-procedure instructions. General anesthesia can cause side effects, including:  Nausea or vomiting  A sore throat from the breathing tube.  Feeling cold or shivery.  Feeling tired, washed out, or achy.  Sleepiness or drowsiness.  Confusion or agitation.  What happens before the procedure? Staying hydrated Follow instructions from your health care provider about hydration, which may include:  Up to 2 hours before the procedure - you may continue to drink clear liquids, such as water, clear fruit juice, black  coffee, and plain tea.  Eating and drinking restrictions Follow instructions from your health care provider about eating and drinking, which may include:  8 hours before the procedure - stop eating heavy meals or foods such as meat, fried foods, or fatty foods.  6 hours before the procedure - stop eating light meals or foods, such as toast or cereal.  6 hours before the procedure - stop drinking milk or drinks that contain milk.  2 hours before the procedure - stop drinking clear liquids.  Medicines  Ask your health care provider about: ? Changing or stopping your regular medicines. This is especially important if you are taking diabetes medicines or blood thinners. ? Taking medicines such as aspirin and ibuprofen. These medicines can thin your blood. Do not take these medicines before your procedure if your health care provider instructs you not to. ? Taking new dietary supplements or medicines. Do not take these during the week before your procedure unless your health care provider approves them.  If you are told to take a medicine or to continue taking a medicine on the day of the  procedure, take the medicine with sips of water. General instructions   Ask if you will be going home the same day, the following day, or after a longer hospital stay. ? Plan to have someone take you home. ? Plan to have someone stay with you for the first 24 hours after you leave the hospital or clinic.  For 3-6 weeks before the procedure, try not to use any tobacco products, such as cigarettes, chewing tobacco, and e-cigarettes.  You may brush your teeth on the morning of the procedure, but make sure to spit out the toothpaste. What happens during the procedure?  You will be given anesthetics through a mask and through an IV tube in one of your veins.  You may receive medicine to help you relax (sedative).  As soon as you are asleep, a breathing tube may be used to help you breathe.  An anesthesia specialist will stay with you throughout the procedure. He or she will help keep you comfortable and safe by continuing to give you medicines and adjusting the amount of medicine that you get. He or she will also watch your blood pressure, pulse, and oxygen levels to make sure that the anesthetics do not cause any problems.  If a breathing tube was used to help you breathe, it will be removed before you wake up. The procedure may vary among health care providers and hospitals. What happens after the procedure?  You will wake up, often slowly, after the procedure is complete, usually in a recovery area.  Your blood pressure, heart rate, breathing rate, and blood oxygen level will be monitored until the medicines you were given have worn off.  You may be given medicine to help you calm down if you feel anxious or agitated.  If you will be going home the same day, your health care provider may check to make sure you can stand, drink, and urinate.  Your health care providers will treat your pain and side effects before you go home.  Do not drive for 24 hours if you received a  sedative.  You may: ? Feel nauseous and vomit. ? Have a sore throat. ? Have mental slowness. ? Feel cold or shivery. ? Feel sleepy. ? Feel tired. ? Feel sore or achy, even in parts of your body where you did not have surgery. This information is not intended  to replace advice given to you by your health care provider. Make sure you discuss any questions you have with your health care provider. Document Released: 02/25/2008 Document Revised: 04/30/2016 Document Reviewed: 11/02/2015 Elsevier Interactive Patient Education  2018 [place] Anesthesia, Adult, Care After These instructions provide you with information about caring for yourself after your procedure. Your health care provider may also give you more specific instructions. Your treatment has been planned according to current medical practices, but problems sometimes occur. Call your health care provider if you have any problems or questions after your procedure. What can I expect after the procedure? After the procedure, it is common to have:  Vomiting.  A sore throat.  Mental slowness.  It is common to feel:  Nauseous.  Cold or shivery.  Sleepy.  Tired.  Sore or achy, even in parts of your body where you did not have surgery.  Follow these instructions at home: For at least 24 hours after the procedure:  Do not: ? Participate in activities where you could fall or become injured. ? Drive. ? Use heavy machinery. ? Drink alcohol. ? Take sleeping pills or medicines that cause drowsiness. ? Make important decisions or sign legal documents. ? Take care of children on your own.  Rest. Eating and drinking  If you vomit, drink water, juice, or soup when you can drink without vomiting.  Drink enough fluid to keep your urine clear or pale yellow.  Make sure you have little or no nausea before eating solid foods.  Follow the diet recommended by your health care provider. General instructions  Have a  responsible adult stay with you until you are awake and alert.  Return to your normal activities as told by your health care provider. Ask your health care provider what activities are safe for you.  Take over-the-counter and prescription medicines only as told by your health care provider.  If you smoke, do not smoke without supervision.  Keep all follow-up visits as told by your health care provider. This is important. Contact a health care provider if:  You continue to have nausea or vomiting at home, and medicines are not helpful.  You cannot drink fluids or start eating again.  You cannot urinate after 8-12 hours.  You develop a skin rash.  You have fever.  You have increasing redness at the site of your procedure. Get help right away if:  You have difficulty breathing.  You have chest pain.  You have unexpected bleeding.  You feel that you are having a life-threatening or urgent problem. This information is not intended to replace advice given to you by your health care provider. Make sure you discuss any questions you have with your health care provider. Document Released: 02/24/2001 Document Revised: 04/22/2016 Document Reviewed: 11/02/2015 Elsevier Interactive Patient Education  [employer].

## 2018-09-11 ENCOUNTER — Encounter (HOSPITAL_COMMUNITY): Payer: Self-pay

## 2018-09-11 ENCOUNTER — Encounter (HOSPITAL_COMMUNITY)
Admission: RE | Admit: 2018-09-11 | Discharge: 2018-09-11 | Disposition: A | Discharge status: Home / Self Care | Payer: BLUE CROSS/BLUE SHIELD | Source: Ambulatory Visit | Attending: Obstetrics & Gynecology | Admitting: Obstetrics & Gynecology

## 2018-09-11 ENCOUNTER — Other Ambulatory Visit: Payer: Self-pay

## 2018-09-11 DIAGNOSIS — Z01812 Encounter for preprocedural laboratory examination: Secondary | ICD-10-CM | POA: Insufficient documentation

## 2018-09-11 HISTORY — DX: Anxiety disorder, unspecified: F41.9

## 2018-09-11 LAB — URINALYSIS, ROUTINE W REFLEX MICROSCOPIC
BILIRUBIN URINE: NEGATIVE
Glucose, UA: NEGATIVE mg/dL
Ketones, ur: NEGATIVE mg/dL
LEUKOCYTES UA: NEGATIVE
NITRITE: NEGATIVE
PH: 5 (ref 5.0–8.0)
Protein, ur: NEGATIVE mg/dL
SPECIFIC GRAVITY, URINE: 1.011 (ref 1.005–1.030)

## 2018-09-11 LAB — COMPREHENSIVE METABOLIC PANEL
ALBUMIN: 4 g/dL (ref 3.5–5.0)
ALK PHOS: 71 U/L (ref 38–126)
ALT: 49 U/L — ABNORMAL HIGH (ref 0–44)
ANION GAP: 9 (ref 5–15)
AST: 26 U/L (ref 15–41)
BILIRUBIN TOTAL: 0.6 mg/dL (ref 0.3–1.2)
BUN: 10 mg/dL (ref 6–20)
CALCIUM: 9.2 mg/dL (ref 8.9–10.3)
CO2: 19 mmol/L — AB (ref 22–32)
CREATININE: 0.78 mg/dL (ref 0.44–1.00)
Chloride: 111 mmol/L (ref 98–111)
GFR calc non Af Amer: >60 mL/min (ref >60)
GLUCOSE: 98 mg/dL (ref 70–99)
POTASSIUM: 3.9 mmol/L (ref 3.5–5.1)
Sodium: 139 mmol/L (ref 135–145)
Total Protein: 7.4 g/dL (ref 6.5–8.1)

## 2018-09-11 LAB — CBC
HEMATOCRIT: 45.2 % (ref 36.0–46.0)
HEMOGLOBIN: 14.5 g/dL (ref 12.0–15.0)
MCH: 28.2 pg (ref 26.0–34.0)
MCHC: 32.1 g/dL (ref 30.0–36.0)
MCV: 87.8 fL (ref 80.0–100.0)
Platelets: 206 10*3/uL (ref 150–400)
RBC: 5.15 MIL/uL — ABNORMAL HIGH (ref 3.87–5.11)
RDW: 13.2 % (ref 11.5–15.5)
WBC: 8.7 10*3/uL (ref 4.0–10.5)
nRBC: 0 % (ref 0.0–0.2)

## 2018-09-11 LAB — RAPID HIV SCREEN (HIV 1/2 AB+AG)
HIV 1/2 Antibodies: NONREACTIVE
HIV-1 P24 Antigen - HIV24: NONREACTIVE

## 2018-09-11 LAB — TYPE AND SCREEN
ABO/RH(D): O POS
ANTIBODY SCREEN: NEGATIVE

## 2018-09-11 LAB — HCG, QUANTITATIVE, PREGNANCY: hCG, Beta Chain, Quant, S: <1 m[IU]/mL (ref <5)

## 2018-09-16 ENCOUNTER — Inpatient Hospital Stay (HOSPITAL_COMMUNITY): Payer: BLUE CROSS/BLUE SHIELD | Admitting: Anesthesiology

## 2018-09-16 ENCOUNTER — Inpatient Hospital Stay (HOSPITAL_COMMUNITY)
Admission: RE | Admit: 2018-09-16 | Discharge: 2018-09-17 | DRG: 743 | Disposition: A | Discharge status: Home / Self Care | Payer: BLUE CROSS/BLUE SHIELD | Attending: Obstetrics & Gynecology | Admitting: Obstetrics & Gynecology

## 2018-09-16 ENCOUNTER — Encounter (HOSPITAL_COMMUNITY): Payer: Self-pay | Admitting: Anesthesiology

## 2018-09-16 ENCOUNTER — Encounter (HOSPITAL_COMMUNITY)
Admission: RE | Disposition: A | Discharge status: Home / Self Care | Payer: Self-pay | Source: Home / Self Care | Attending: Obstetrics & Gynecology

## 2018-09-16 ENCOUNTER — Other Ambulatory Visit: Payer: Self-pay

## 2018-09-16 DIAGNOSIS — Z91018 Allergy to other foods: Secondary | ICD-10-CM

## 2018-09-16 DIAGNOSIS — N888 Other specified noninflammatory disorders of cervix uteri: Secondary | ICD-10-CM | POA: Diagnosis present

## 2018-09-16 DIAGNOSIS — Z91030 Bee allergy status: Secondary | ICD-10-CM

## 2018-09-16 DIAGNOSIS — Z885 Allergy status to narcotic agent status: Secondary | ICD-10-CM | POA: Diagnosis not present

## 2018-09-16 DIAGNOSIS — F17210 Nicotine dependence, cigarettes, uncomplicated: Secondary | ICD-10-CM | POA: Diagnosis present

## 2018-09-16 DIAGNOSIS — Z8619 Personal history of other infectious and parasitic diseases: Secondary | ICD-10-CM | POA: Diagnosis not present

## 2018-09-16 DIAGNOSIS — N941 Unspecified dyspareunia: Secondary | ICD-10-CM

## 2018-09-16 DIAGNOSIS — Z90710 Acquired absence of both cervix and uterus: Secondary | ICD-10-CM | POA: Diagnosis present

## 2018-09-16 DIAGNOSIS — Z6838 Body mass index (BMI) 38.0-38.9, adult: Secondary | ICD-10-CM

## 2018-09-16 DIAGNOSIS — N938 Other specified abnormal uterine and vaginal bleeding: Secondary | ICD-10-CM | POA: Diagnosis not present

## 2018-09-16 DIAGNOSIS — N946 Dysmenorrhea, unspecified: Secondary | ICD-10-CM | POA: Diagnosis present

## 2018-09-16 DIAGNOSIS — Z9049 Acquired absence of other specified parts of digestive tract: Secondary | ICD-10-CM | POA: Diagnosis not present

## 2018-09-16 HISTORY — PX: ABDOMINAL HYSTERECTOMY: SHX81

## 2018-09-16 SURGERY — HYSTERECTOMY, ABDOMINAL
Anesthesia: General | Site: Abdomen

## 2018-09-16 MED ORDER — ROCURONIUM BROMIDE 50 MG/5ML IV SOLN
INTRAVENOUS | Status: AC
  Filled 2018-09-16: qty 1

## 2018-09-16 MED ORDER — MENTHOL 3 MG MT LOZG: 1.0000 | LOZENGE | OROMUCOSAL | Status: DC | PRN

## 2018-09-16 MED ORDER — MORPHINE SULFATE (PF) 2 MG/ML IV SOLN
2.0000 mg | INTRAVENOUS | Status: AC | PRN
  Administered 2018-09-16 (×3): 2 mg via INTRAVENOUS
  Filled 2018-09-16 (×3): qty 1

## 2018-09-16 MED ORDER — BUPIVACAINE LIPOSOME 1.3 % IJ SUSP
20.0000 mL | Freq: Once | INTRAMUSCULAR | Status: DC
  Filled 2018-09-16: qty 20

## 2018-09-16 MED ORDER — ZOLPIDEM TARTRATE 5 MG PO TABS: 10.0000 mg | ORAL_TABLET | Freq: Every evening | ORAL | Status: DC | PRN

## 2018-09-16 MED ORDER — LORAZEPAM 2 MG/ML IJ SOLN
INTRAMUSCULAR | Status: AC
  Filled 2018-09-16: qty 1

## 2018-09-16 MED ORDER — LACTATED RINGERS IV SOLN
INTRAVENOUS | Status: DC
  Administered 2018-09-16 (×2): via INTRAVENOUS

## 2018-09-16 MED ORDER — ONDANSETRON HCL 4 MG PO TABS: 8.0000 mg | ORAL_TABLET | Freq: Four times a day (QID) | ORAL | Status: DC | PRN

## 2018-09-16 MED ORDER — LORAZEPAM 2 MG/ML IJ SOLN
1.0000 mg | Freq: Once | INTRAMUSCULAR | Status: AC
  Administered 2018-09-16: 1 mg via INTRAVENOUS

## 2018-09-16 MED ORDER — MIDAZOLAM HCL 2 MG/2ML IJ SOLN
INTRAMUSCULAR | Status: AC
  Filled 2018-09-16: qty 2

## 2018-09-16 MED ORDER — SODIUM CHLORIDE 0.9% FLUSH
INTRAVENOUS | Status: AC
  Filled 2018-09-16: qty 10

## 2018-09-16 MED ORDER — MIDAZOLAM HCL 5 MG/5ML IJ SOLN
INTRAMUSCULAR | Status: DC | PRN
  Administered 2018-09-16: 2 mg via INTRAVENOUS

## 2018-09-16 MED ORDER — CEFAZOLIN SODIUM-DEXTROSE 2-4 GM/100ML-% IV SOLN
INTRAVENOUS | Status: AC
  Filled 2018-09-16: qty 100

## 2018-09-16 MED ORDER — ONDANSETRON HCL 4 MG/2ML IJ SOLN
INTRAMUSCULAR | Status: DC | PRN
  Administered 2018-09-16: 4 mg via INTRAVENOUS

## 2018-09-16 MED ORDER — SUCCINYLCHOLINE CHLORIDE 20 MG/ML IJ SOLN
INTRAMUSCULAR | Status: AC
  Filled 2018-09-16: qty 1

## 2018-09-16 MED ORDER — SUGAMMADEX SODIUM 500 MG/5ML IV SOLN
INTRAVENOUS | Status: DC | PRN
  Administered 2018-09-16: 300 mg via INTRAVENOUS

## 2018-09-16 MED ORDER — BUPIVACAINE LIPOSOME 1.3 % IJ SUSP
INTRAMUSCULAR | Status: AC
  Filled 2018-09-16: qty 20

## 2018-09-16 MED ORDER — SUGAMMADEX SODIUM 500 MG/5ML IV SOLN
INTRAVENOUS | Status: AC
  Filled 2018-09-16: qty 5

## 2018-09-16 MED ORDER — BUPIVACAINE LIPOSOME 1.3 % IJ SUSP
INTRAMUSCULAR | Status: DC | PRN
  Administered 2018-09-16: 20 mL

## 2018-09-16 MED ORDER — ALPRAZOLAM 1 MG PO TABS
1.0000 mg | ORAL_TABLET | Freq: Two times a day (BID) | ORAL | Status: DC
  Administered 2018-09-16 – 2018-09-17 (×2): 1 mg via ORAL
  Filled 2018-09-16 (×2): qty 1

## 2018-09-16 MED ORDER — SCOPOLAMINE 1 MG/3DAYS TD PT72
1.0000 | MEDICATED_PATCH | TRANSDERMAL | Status: DC
  Administered 2018-09-16: 1.5 mg via TRANSDERMAL

## 2018-09-16 MED ORDER — ZOLPIDEM TARTRATE 5 MG PO TABS
5.0000 mg | ORAL_TABLET | Freq: Every evening | ORAL | Status: DC | PRN
  Administered 2018-09-17: 5 mg via ORAL
  Filled 2018-09-16: qty 1

## 2018-09-16 MED ORDER — DOCUSATE SODIUM 100 MG PO CAPS
100.0000 mg | ORAL_CAPSULE | Freq: Two times a day (BID) | ORAL | Status: DC
  Administered 2018-09-16 – 2018-09-17 (×2): 100 mg via ORAL
  Filled 2018-09-16 (×2): qty 1

## 2018-09-16 MED ORDER — ROCURONIUM BROMIDE 100 MG/10ML IV SOLN
INTRAVENOUS | Status: DC | PRN
  Administered 2018-09-16: 5 mg via INTRAVENOUS
  Administered 2018-09-16: 45 mg via INTRAVENOUS
  Administered 2018-09-16: 10 mg via INTRAVENOUS

## 2018-09-16 MED ORDER — ONDANSETRON 4 MG PO TBDP
8.0000 mg | ORAL_TABLET | Freq: Three times a day (TID) | ORAL | Status: DC | PRN
  Administered 2018-09-16: 8 mg via ORAL
  Filled 2018-09-16: qty 2

## 2018-09-16 MED ORDER — ALUM & MAG HYDROXIDE-SIMETH 200-200-20 MG/5ML PO SUSP: 30.0000 mL | ORAL | Status: DC | PRN

## 2018-09-16 MED ORDER — LIDOCAINE HCL (PF) 1 % IJ SOLN
INTRAMUSCULAR | Status: AC
  Filled 2018-09-16: qty 5

## 2018-09-16 MED ORDER — CHOLESTYRAMINE LIGHT 4 G PO PACK
4.0000 g | PACK | Freq: Two times a day (BID) | ORAL | Status: DC
  Administered 2018-09-16 – 2018-09-17 (×2): 4 g via ORAL
  Filled 2018-09-16 (×4): qty 1

## 2018-09-16 MED ORDER — KCL IN DEXTROSE-NACL 20-5-0.45 MEQ/L-%-% IV SOLN
INTRAVENOUS | Status: AC
  Administered 2018-09-16: 14:00:00 via INTRAVENOUS

## 2018-09-16 MED ORDER — PROMETHAZINE HCL 25 MG/ML IJ SOLN: 25.0000 mg | Freq: Four times a day (QID) | INTRAMUSCULAR | Status: DC | PRN

## 2018-09-16 MED ORDER — FENTANYL CITRATE (PF) 100 MCG/2ML IJ SOLN
100.0000 ug | INTRAMUSCULAR | Status: DC | PRN
  Administered 2018-09-16 – 2018-09-17 (×4): 100 ug via INTRAVENOUS
  Filled 2018-09-16 (×4): qty 2

## 2018-09-16 MED ORDER — PROPOFOL 10 MG/ML IV BOLUS
INTRAVENOUS | Status: AC
  Filled 2018-09-16: qty 40

## 2018-09-16 MED ORDER — OXYCODONE-ACETAMINOPHEN 5-325 MG PO TABS
1.0000 | ORAL_TABLET | ORAL | Status: DC | PRN
  Administered 2018-09-16 – 2018-09-17 (×4): 1 via ORAL
  Filled 2018-09-16 (×4): qty 1

## 2018-09-16 MED ORDER — CEFAZOLIN SODIUM-DEXTROSE 2-4 GM/100ML-% IV SOLN
2.0000 g | INTRAVENOUS | Status: AC
  Administered 2018-09-16: 2 g via INTRAVENOUS

## 2018-09-16 MED ORDER — LIDOCAINE HCL 1 % IJ SOLN
INTRAMUSCULAR | Status: DC | PRN
  Administered 2018-09-16: 50 mg via INTRADERMAL

## 2018-09-16 MED ORDER — ENOXAPARIN SODIUM 60 MG/0.6ML ~~LOC~~ SOLN
60.0000 mg | SUBCUTANEOUS | Status: DC
  Filled 2018-09-16: qty 0.6

## 2018-09-16 MED ORDER — SENNOSIDES-DOCUSATE SODIUM 8.6-50 MG PO TABS: 1.0000 | ORAL_TABLET | Freq: Every evening | ORAL | Status: DC | PRN

## 2018-09-16 MED ORDER — MORPHINE SULFATE (PF) 2 MG/ML IV SOLN
2.0000 mg | INTRAVENOUS | Status: DC | PRN
  Administered 2018-09-16: 2 mg via INTRAVENOUS
  Filled 2018-09-16: qty 1

## 2018-09-16 MED ORDER — HEMOSTATIC AGENTS (NO CHARGE) OPTIME
TOPICAL | Status: DC | PRN
  Administered 2018-09-16: 1 via TOPICAL

## 2018-09-16 MED ORDER — SUCCINYLCHOLINE CHLORIDE 20 MG/ML IJ SOLN
INTRAMUSCULAR | Status: DC | PRN
  Administered 2018-09-16: 150 mg via INTRAVENOUS

## 2018-09-16 MED ORDER — MEPERIDINE HCL 50 MG/ML IJ SOLN: 6.2500 mg | INTRAMUSCULAR | Status: DC | PRN

## 2018-09-16 MED ORDER — ONDANSETRON HCL 4 MG/2ML IJ SOLN: 4.0000 mg | Freq: Once | INTRAMUSCULAR | Status: DC | PRN

## 2018-09-16 MED ORDER — ACETAMINOPHEN 10 MG/ML IV SOLN
1000.0000 mg | Freq: Once | INTRAVENOUS | Status: AC
  Administered 2018-09-16: 1000 mg via INTRAVENOUS

## 2018-09-16 MED ORDER — FENTANYL CITRATE (PF) 100 MCG/2ML IJ SOLN
INTRAMUSCULAR | Status: AC
  Filled 2018-09-16: qty 2

## 2018-09-16 MED ORDER — SCOPOLAMINE 1 MG/3DAYS TD PT72
MEDICATED_PATCH | TRANSDERMAL | Status: AC
  Filled 2018-09-16: qty 1

## 2018-09-16 MED ORDER — ACETAMINOPHEN 10 MG/ML IV SOLN
INTRAVENOUS | Status: AC
  Filled 2018-09-16: qty 100

## 2018-09-16 MED ORDER — DIPHENHYDRAMINE HCL 50 MG/ML IJ SOLN: 25.0000 mg | Freq: Four times a day (QID) | INTRAMUSCULAR | Status: DC | PRN

## 2018-09-16 MED ORDER — ONDANSETRON HCL 4 MG/2ML IJ SOLN
INTRAMUSCULAR | Status: AC
  Filled 2018-09-16: qty 2

## 2018-09-16 MED ORDER — HYDROMORPHONE HCL 1 MG/ML IJ SOLN: 0.5000 mg | INTRAMUSCULAR | Status: DC | PRN

## 2018-09-16 MED ORDER — SODIUM CHLORIDE 0.9 % IV SOLN
8.0000 mg | Freq: Four times a day (QID) | INTRAVENOUS | Status: DC | PRN
  Filled 2018-09-16: qty 4

## 2018-09-16 MED ORDER — FENTANYL CITRATE (PF) 250 MCG/5ML IJ SOLN
INTRAMUSCULAR | Status: AC
  Filled 2018-09-16: qty 5

## 2018-09-16 MED ORDER — BISACODYL 10 MG RE SUPP: 10.0000 mg | Freq: Every day | RECTAL | Status: DC | PRN

## 2018-09-16 MED ORDER — 0.9 % SODIUM CHLORIDE (POUR BTL) OPTIME
TOPICAL | Status: DC | PRN
  Administered 2018-09-16: 1000 mL

## 2018-09-16 MED ORDER — PROPOFOL 10 MG/ML IV BOLUS
INTRAVENOUS | Status: DC | PRN
  Administered 2018-09-16: 170 mg via INTRAVENOUS

## 2018-09-16 MED ORDER — ENOXAPARIN SODIUM 40 MG/0.4ML ~~LOC~~ SOLN: 40.0000 mg | SUBCUTANEOUS | Status: DC

## 2018-09-16 MED ORDER — FENTANYL CITRATE (PF) 100 MCG/2ML IJ SOLN
INTRAMUSCULAR | Status: DC | PRN
  Administered 2018-09-16: 100 ug via INTRAVENOUS
  Administered 2018-09-16: 50 ug via INTRAVENOUS
  Administered 2018-09-16 (×2): 100 ug via INTRAVENOUS

## 2018-09-16 SURGICAL SUPPLY — 38 items
CELLS DAT CNTRL 66122 CELL SVR (MISCELLANEOUS) ×2 IMPLANT
CLOTH BEACON ORANGE TIMEOUT ST (SAFETY) ×3 IMPLANT
COVER LIGHT HANDLE STERIS (MISCELLANEOUS) ×6 IMPLANT
DERMABOND ADVANCED (GAUZE/BANDAGES/DRESSINGS) ×1
DERMABOND ADVANCED .7 DNX12 (GAUZE/BANDAGES/DRESSINGS) ×2 IMPLANT
DRAPE WARM FLUID 44X44 (DRAPE) ×3 IMPLANT
DRSG OPSITE POSTOP 4X8 (GAUZE/BANDAGES/DRESSINGS) ×3 IMPLANT
ELECT REM PT RETURN 9FT ADLT (ELECTROSURGICAL) ×3
ELECTRODE REM PT RTRN 9FT ADLT (ELECTROSURGICAL) ×2 IMPLANT
GLOVE BIOGEL PI IND STRL 7.0 (GLOVE) ×8 IMPLANT
GLOVE BIOGEL PI IND STRL 8 (GLOVE) ×2 IMPLANT
GLOVE BIOGEL PI INDICATOR 7.0 (GLOVE) ×4
GLOVE BIOGEL PI INDICATOR 8 (GLOVE) ×1
GLOVE ECLIPSE 6.5 STRL STRAW (GLOVE) ×6 IMPLANT
GLOVE ECLIPSE 8.0 STRL XLNG CF (GLOVE) ×3 IMPLANT
GOWN STRL REUS W/TWL LRG LVL3 (GOWN DISPOSABLE) ×6 IMPLANT
GOWN STRL REUS W/TWL XL LVL3 (GOWN DISPOSABLE) ×3 IMPLANT
HEMOSTAT ARISTA ABSORB 3G PWDR (MISCELLANEOUS) ×3 IMPLANT
INST SET MAJOR GENERAL (KITS) ×3 IMPLANT
KIT TURNOVER KIT A (KITS) ×3 IMPLANT
MANIFOLD NEPTUNE II (INSTRUMENTS) ×3 IMPLANT
NEEDLE HYPO 21X1.5 SAFETY (NEEDLE) ×3 IMPLANT
NS IRRIG 1000ML POUR BTL (IV SOLUTION) ×6 IMPLANT
PACK ABDOMINAL MAJOR (CUSTOM PROCEDURE TRAY) ×3 IMPLANT
PAD ARMBOARD 7.5X6 YLW CONV (MISCELLANEOUS) ×3 IMPLANT
RTRCTR WOUND ALEXIS 18CM MED (MISCELLANEOUS) ×3
SET BASIN LINEN APH (SET/KITS/TRAYS/PACK) ×3 IMPLANT
SUT CHROMIC 0 CT 1 (SUTURE) ×3 IMPLANT
SUT MON AB 3-0 SH 27 (SUTURE) ×3 IMPLANT
SUT PLAIN 2 0 XLH (SUTURE) IMPLANT
SUT VIC AB 0 CT1 27 (SUTURE) ×3
SUT VIC AB 0 CT1 27XCR 8 STRN (SUTURE) ×6 IMPLANT
SUT VIC AB 0 CTX 36 (SUTURE) ×1
SUT VIC AB 0 CTX36XBRD ANTBCTR (SUTURE) ×2 IMPLANT
SUT VICRYL 3 0 (SUTURE) ×3 IMPLANT
SYR 20CC LL (SYRINGE) ×3 IMPLANT
TOWEL SURG RFD BLUE STRL DISP (DISPOSABLE) ×3 IMPLANT
TRAY FOLEY MTR SLVR 16FR STAT (SET/KITS/TRAYS/PACK) ×3 IMPLANT

## 2018-09-16 NOTE — Anesthesia Procedure Notes (Signed)
Procedure Name: Intubation Date/Time: 09/16/2018 10:10 AM Performed by: Charmaine Downs, CRNA Pre-anesthesia Checklist: Emergency Drugs available, Patient identified, Patient being monitored, Suction available and Timeout performed Patient Re-evaluated:Patient Re-evaluated prior to induction Oxygen Delivery Method: Circle system utilized Preoxygenation: Pre-oxygenation with 100% oxygen Induction Type: IV induction, Rapid sequence and Cricoid Pressure applied Ventilation: Mask ventilation without difficulty Laryngoscope Size: Mac and 4 Grade View: Grade I Tube type: Oral Tube size: 7.0 mm Number of attempts: 1 Airway Equipment and Method: Stylet Placement Confirmation: ETT inserted through vocal cords under direct vision,  positive ETCO2 and breath sounds checked- equal and bilateral Secured at: 22 cm Tube secured with: Tape Dental Injury: Teeth and Oropharynx as per pre-operative assessment

## 2018-09-16 NOTE — Interval H&P Note (Signed)
History and Physical Interval Note:  09/16/2018 9:55 AM  Gwendolyn Glass  has presented today for surgery, with the diagnosis of DUB  The various methods of treatment have been discussed with the patient and family. After consideration of risks, benefits and other options for treatment, the patient has consented to  Procedure(s): HYSTERECTOMY ABDOMINAL (N/A) BILATERAL SALPINGECTOMY (Bilateral) as a surgical intervention .  The patient's history has been reviewed, patient examined, no change in status, stable for surgery.  I have reviewed the patient's chart and labs.  Questions were answered to the patient's satisfaction.     Florian Buff

## 2018-09-16 NOTE — H&P (Signed)
Preoperative History and Physical  Gwendolyn Glass is a 37 y.o. [id number] with No LMP recorded. (Menstrual status: Irregular Periods). admitted for a abdominal hysterectomy.  Long standing irregular prolonged bleeding with pain  Gwendolyn L Brownis a 37 y.o.Caucasianfemalepresenting today for referral from PCP for heavy vaginal bleeding off and on for 2 years. Had seen OB about it 1.5 years ago. [place] c/w adenomyosis, which pt states OB told her "it will only be a mild inconvenience to you" and did not offer treatment other than "medicine I'd have to be on for the rest of my life" or having to" take hormones forever if she had a hyst". Was not offered surgery or and IUD. It has been much more than an inconvenience--pt has since gotten divorced and quality of life has decreased significantly d/t so much bleeding "all the time" and having to mange her life around changing clothes (only wears black now w/long shirts). Is very interested in hysterectomy. Does not have fallopian tubes d/t 2 ectopic pg, but has both ovaries  With DUB + severe dys,menorrhea and dyspareunia will proceed with removal of cervix along with the uterus  PMH:    Past Medical History:  Diagnosis Date  . Anxiety   . [place] recluse spider bite 02/27/2016  . C. difficile diarrhea 02/29/2016  . Miscarriage    pt. states shes  spotting  and wearing a tampon  . Ovarian mass, right 03/01/2016   ? complex cyst  . Pregnant     PSH:     Past Surgical History:  Procedure Laterality Date  . APPENDECTOMY    . CESAREAN SECTION    . CHOLECYSTECTOMY    . CHOLECYSTECTOMY  07/28/2012   Procedure: LAPAROSCOPIC CHOLECYSTECTOMY;  Surgeon: Donato Heinz, MD;  Location: [facility];  Service: General;  Laterality: N/A;  . LAPAROSCOPY N/A 09/07/2014   Procedure: LAPAROSCOPY OPERATIVE;  Surgeon: Woodroe Mode, MD;  Location: [facility];  Service: Gynecology;  Laterality: N/A;  . removal of lt fallopian tube    . UNILATERAL SALPINGECTOMY Right  09/07/2014   Procedure: UNILATERAL SALPINGECTOMY;  Surgeon: Woodroe Mode, MD;  Location: [facility];  Service: Gynecology;  Laterality: Right;  . WOUND DEBRIDEMENT Right 02/28/2016   Procedure: DEBRIDEMENT OF RIGHT SHOULDER SPIDER BITE;  Surgeon: Aviva Signs, MD;  Location: [facility];  Service: General;  Laterality: Right;    POb/GynH:      OB History    Gravida  10   Para  2   Term  2   Preterm      AB  7   Living        SAB  6   TAB      Ectopic  1   Multiple      Live Births              SH:   Social History   Tobacco Use  . Smoking status: Current Every Day Smoker    Packs/day: 0.25    Years: 5.00    Pack years: 1.25    Types: Cigarettes  . Smokeless tobacco: Never Used  Substance Use Topics  . Alcohol use: Yes    Comment: occ.  . Drug use: No    FH:   History reviewed. No pertinent family history.   Allergies:  Allergies  Allergen Reactions  . Cinnamon Swelling and Other (See Comments)    REACTION: throat swells  . Bee Venom Swelling and Other (See Comments)    REACTION:  All over body swelling  . Vicodin [Hydrocodone-Acetaminophen] Nausea And Vomiting  . Dilaudid [Hydromorphone Hcl] Hives  . Toradol [Ketorolac Tromethamine]     Hives, and chest pain    Medications:      No current facility-administered medications for this encounter.   Review of Systems:   Review of Systems  Constitutional: Negative for fever, chills, weight loss, malaise/fatigue and diaphoresis.  HENT: Negative for hearing loss, ear pain, nosebleeds, congestion, sore throat, neck pain, tinnitus and ear discharge.   Eyes: Negative for blurred vision, double vision, photophobia, pain, discharge and redness.  Respiratory: Negative for cough, hemoptysis, sputum production, shortness of breath, wheezing and stridor.   Cardiovascular: Negative for chest pain, palpitations, orthopnea, claudication, leg swelling and PND.  Gastrointestinal: Positive for abdominal pain. Negative for  heartburn, nausea, vomiting, diarrhea, constipation, blood in stool and melena.  Genitourinary: Negative for dysuria, urgency, frequency, hematuria and flank pain.  Musculoskeletal: Negative for myalgias, back pain, joint pain and falls.  Skin: Negative for itching and rash.  Neurological: Negative for dizziness, tingling, tremors, sensory change, speech change, focal weakness, seizures, loss of consciousness, weakness and headaches.  Endo/Heme/Allergies: Negative for environmental allergies and polydipsia. Does not bruise/bleed easily.  Psychiatric/Behavioral: Negative for depression, suicidal ideas, hallucinations, memory loss and substance abuse. The patient is not nervous/anxious and does not have insomnia.      PHYSICAL EXAM:  There were no vitals taken for this visit.    Vitals reviewed. Constitutional: She is oriented to person, place, and time. She appears well-developed and well-nourished.  HENT:  Head: Normocephalic and atraumatic.  Right Ear: External ear normal.  Left Ear: External ear normal.  Nose: Nose normal.  Mouth/Throat: Oropharynx is clear and moist.  Eyes: Conjunctivae and EOM are normal. Pupils are equal, round, and reactive to light. Right eye exhibits no discharge. Left eye exhibits no discharge. No scleral icterus.  Neck: Normal range of motion. Neck supple. No tracheal deviation present. No thyromegaly present.  Cardiovascular: Normal rate, regular rhythm, normal heart sounds and intact distal pulses.  Exam reveals no gallop and no friction rub.   No murmur heard. Respiratory: Effort normal and breath sounds normal. No respiratory distress. She has no wheezes. She has no rales. She exhibits no tenderness.  GI: Soft. Bowel sounds are normal. She exhibits no distension and no mass. There is tenderness. There is no rebound and no guarding.  Genitourinary:       Vulva is normal without lesions Vagina is pink moist without discharge Cervix normal in appearance and  pap is normal Uterus is normal size, contour, position, consistency, mobility, non-tender Adnexa is negative with normal sized ovaries by sonogram  Musculoskeletal: Normal range of motion. She exhibits no edema and no tenderness.  Neurological: She is alert and oriented to person, place, and time. She has normal reflexes. She displays normal reflexes. No cranial nerve deficit. She exhibits normal muscle tone. Coordination normal.  Skin: Skin is warm and dry. No rash noted. No erythema. No pallor.  Psychiatric: She has a normal mood and affect. Her behavior is normal. Judgment and thought content normal.    Labs: Results for orders placed or performed during the hospital encounter of 09/11/18 (from the past 336 hour(s))  CBC   Collection Time: 09/11/18  8:02 AM  Result Value Ref Range   WBC 8.7 4.0 - 10.5 K/uL   RBC 5.15 (H) 3.87 - 5.11 MIL/uL   Hemoglobin 14.5 12.0 - 15.0 g/dL   HCT 45.2 36.0 -  46.0 %   MCV 87.8 80.0 - 100.0 fL   MCH 28.2 26.0 - 34.0 pg   MCHC 32.1 30.0 - 36.0 g/dL   RDW 13.2 11.5 - 15.5 %   Platelets 206 150 - 400 K/uL   nRBC 0.0 0.0 - 0.2 %  Comprehensive metabolic panel   Collection Time: 09/11/18  8:02 AM  Result Value Ref Range   Sodium 139 135 - 145 mmol/L   Potassium 3.9 3.5 - 5.1 mmol/L   Chloride 111 98 - 111 mmol/L   CO2 19 (L) 22 - 32 mmol/L   Glucose, Bld 98 70 - 99 mg/dL   BUN 10 6 - 20 mg/dL   Creatinine, Ser 0.78 0.44 - 1.00 mg/dL   Calcium 9.2 8.9 - 10.3 mg/dL   Total Protein 7.4 6.5 - 8.1 g/dL   Albumin 4.0 3.5 - 5.0 g/dL   AST 26 15 - 41 U/L   ALT 49 (H) 0 - 44 U/L   Alkaline Phosphatase 71 38 - 126 U/L   Total Bilirubin 0.6 0.3 - 1.2 mg/dL   GFR calc non Af Amer >60 >60 mL/min   GFR calc Af Amer >60 >60 mL/min   Anion gap 9 5 - 15  hCG, quantitative, pregnancy   Collection Time: 09/11/18  8:02 AM  Result Value Ref Range   hCG, Beta Chain, Quant, S <1 <5 mIU/mL  Rapid HIV screen (HIV 1/2 Ab+Ag)   Collection Time: 09/11/18  8:02 AM   Result Value Ref Range   HIV-1 P24 Antigen - HIV24 NON REACTIVE NON REACTIVE   HIV 1/2 Antibodies NON REACTIVE NON REACTIVE   Interpretation (HIV Ag Ab)      A non reactive test result means that HIV 1 or HIV 2 antibodies and HIV 1 p24 antigen were not detected in the specimen.  Urinalysis, Routine w reflex microscopic   Collection Time: 09/11/18  8:02 AM  Result Value Ref Range   Color, Urine YELLOW YELLOW   APPearance HAZY (A) CLEAR   Specific Gravity, Urine 1.011 1.005 - 1.030   pH 5.0 5.0 - 8.0   Glucose, UA NEGATIVE NEGATIVE mg/dL   Hgb urine dipstick SMALL (A) NEGATIVE   Bilirubin Urine NEGATIVE NEGATIVE   Ketones, ur NEGATIVE NEGATIVE mg/dL   Protein, ur NEGATIVE NEGATIVE mg/dL   Nitrite NEGATIVE NEGATIVE   Leukocytes, UA NEGATIVE NEGATIVE   RBC / HPF 0-5 0 - 5 RBC/hpf   WBC, UA 0-5 0 - 5 WBC/hpf   Bacteria, UA RARE (A) NONE SEEN   Squamous Epithelial / LPF 11-20 0 - 5   Mucus PRESENT   Type and screen   Collection Time: 09/11/18  8:02 AM  Result Value Ref Range   ABO/RH(D) O POS    Antibody Screen NEG    Sample Expiration      09/24/2018 Performed at [facility], [address] [place], [place] [address]     EKG: No orders found for this or any previous visit.  Imaging Studies: US Pelvis Transvanginal Non-ob (tv Only)  Result Date: 08/18/2018 GYNECOLOGIC SONOGRAM TEMIMA KUTSCH is a 37 y.o. [id number] unknown LMP,she is here for a pelvic sonogram for abnormal uterine bleeding. Uterus                      8.4 x 6.1 x 8 cm, vol 214 ml,enlarged homogeneous anteverted uterus Endometrium          17.9 mm, symmetrical, heterogeneous endometrium,no color  flow w/in endometrium Right ovary             2.1 x 1.7 x 1.6 cm, wnl Left ovary                2.1 x 1.5 x 2 cm, wnl No free fluid,mult small, simple nabothian cysts Technician Comments: PELVIC US TA/TV:enlarged homogeneous anteverted uterus,EEC 17.9 mm,heterogeneous endometrium,no color flow w/in endometrium,normal  ovaries bilat,ovaries appear mobile,no pain during ultrasound,no free fluid [employer] 08/18/2018 10:29 AM Clinical Impression and recommendations: I have reviewed the sonogram results above, combined with the patient's current clinical course, below are my impressions and any appropriate recommendations for management based on the sonographic findings. Uterus is small normal Endometrium is thickened due to decidualization from megestrol Ovaries are normal Gwendolyn Glass 08/18/2018 10:50 AM   US Pelvis (transabdominal Only)  Result Date: 08/18/2018 GYNECOLOGIC SONOGRAM Gwendolyn Glass is a 37 y.o. [id number] unknown LMP,she is here for a pelvic sonogram for abnormal uterine bleeding. Uterus                      8.4 x 6.1 x 8 cm, vol 214 ml,enlarged homogeneous anteverted uterus Endometrium          17.9 mm, symmetrical, heterogeneous endometrium,no color flow w/in endometrium Right ovary             2.1 x 1.7 x 1.6 cm, wnl Left ovary                2.1 x 1.5 x 2 cm, wnl No free fluid,mult small, simple nabothian cysts Technician Comments: PELVIC US TA/TV:enlarged homogeneous anteverted uterus,EEC 17.9 mm,heterogeneous endometrium,no color flow w/in endometrium,normal ovaries bilat,ovaries appear mobile,no pain during ultrasound,no free fluid [employer] 08/18/2018 10:29 AM Clinical Impression and recommendations: I have reviewed the sonogram results above, combined with the patient's current clinical course, below are my impressions and any appropriate recommendations for management based on the sonographic findings. Uterus is small normal Endometrium is thickened due to decidualization from megestrol Ovaries are normal Gwendolyn Glass 08/18/2018 10:50 AM      Assessment:  Impression: DUB (dysfunctional uterine bleeding)  Dysmenorrhea  Dyspareunia, bump     Patient Active Problem List   Diagnosis Date Noted  . Cellulitis of right upper extremity   . Ovarian mass, right 03/01/2016  . RLQ  abdominal pain 02/29/2016  . Nausea with vomiting 02/29/2016  . Diarrhea 02/29/2016  . C. difficile diarrhea 02/29/2016  . C. difficile colitis 02/29/2016  . Morbid obesity ([place]) 02/28/2016  . Tobacco abuse 02/28/2016  . [place] recluse spider bite 02/27/2016  . Ectopic pregnancy, tubal 09/07/2014    Plan: Abdominal hysterectomy through a mini lap incision fro management of bleeding problems, pain and dyspareunia, preserve ovaries  Gwendolyn Glass 09/16/2018 8:19 AM

## 2018-09-16 NOTE — Transfer of Care (Signed)
Immediate Anesthesia Transfer of Care Note  Patient: Gwendolyn Glass  Procedure(s) Performed: HYSTERECTOMY ABDOMINAL (N/A Abdomen)  Patient Location: PACU  Anesthesia Type:General  Level of Consciousness: awake and patient cooperative  Airway & Oxygen Therapy: Patient Spontanous Breathing and Patient connected to face mask oxygen  Post-op Assessment: Report given to RN, Post -op Vital signs reviewed and stable and Patient moving all extremities  Post vital signs: Reviewed and stable  Last Vitals:  Vitals Value Taken Time  BP    Temp    Pulse    Resp    SpO2      Last Pain:  Vitals:   09/16/18 0919  TempSrc: (P) Oral  PainSc:       Patients Stated Pain Goal: 5 (94/[id number]/47 3958)  Complications: No apparent anesthesia complications

## 2018-09-16 NOTE — Anesthesia Postprocedure Evaluation (Signed)
Anesthesia Post Note  Patient: Gwendolyn Glass  Procedure(s) Performed: HYSTERECTOMY ABDOMINAL (N/A Abdomen)  Patient location during evaluation: PACU Anesthesia Type: General Level of consciousness: awake and patient cooperative Pain management: pain level controlled Vital Signs Assessment: post-procedure vital signs reviewed and stable Respiratory status: spontaneous breathing, nonlabored ventilation and respiratory function stable Cardiovascular status: blood pressure returned to baseline Postop Assessment: no apparent nausea or vomiting Anesthetic complications: no     Last Vitals:  Vitals:   09/16/18 0919 09/16/18 1200  BP: 116/82 129/81  Pulse:  95  Resp:  12  Temp: 36.8 C 36.9 C  SpO2:  100%    Last Pain:  Vitals:   09/16/18 1200  TempSrc:   PainSc: 10-Worst pain ever                 Nour Scalise J

## 2018-09-16 NOTE — Op Note (Signed)
Preoperative diagnosis:  1.  DUB                                          2.  Dysmenorrhea                                         3.  Dyspareunia                                           Postoperative diagnosis:  Same as above   Procedure:  Abdominal hysterectomy, total(ovaries were preserved  Surgeon:  Florian Buff  Assistant:    Anesthesia:  General endotracheal  Preoperative clinical summary:   Gwendolyn L Brownis a 37 y.o.Caucasianfemalepresenting today for referral from PCP for heavy vaginal bleeding off and on for 2 years. Had seen OB about it 1.5 years ago. [place] c/w adenomyosis, which pt states OB told her "it will only be a mild inconvenience to you" and did not offer treatment other than "medicine I'd have to be on for the rest of my life" or having to" take hormones forever if she had a hyst". Was not offered surgery or and IUD. It has been much more than an inconvenience--pt has since gotten divorced and quality of life has decreased significantly d/t so much bleeding "all the time" and having to mange her life around changing clothes (only wears black now w/long shirts). Is very interested in hysterectomy. Does not have fallopian tubes d/t 2 ectopic pg, but has both ovaries  With DUB + severe dys,menorrhea and dyspareunia will proceed with removal of cervix along with the uterus  Intraoperative findings: some adhesions of the bladder and lower uterine segment, both Fallopian tubes were surgically absent, both ovaries were normal  Description of operation:  Patient was taken to the operating room and placed in the supine position where she underwent general endotracheal anesthesia.  She was then prepped and draped in the usual sterile fashion and a Foley catheter was placed for continuous bladder drainage.  A mini lap transverse skin incision was made and carried down sharply to the rectus fascia which was scored in the midline and extended laterally.  The fascia was taken off  the muscles superiorly and inferiorly without difficulty.  The muscles were divided.  The peritoneal cavity was entered.  An medium Alexis self-retaining retractor was placed.  The upper abdomen was packed away. Both uterine cornu were grasped with Coker clamps.  The left round ligament was suture ligated and coagulated with the electrocautery unit.  The left vesicouterine serosal flap was created.  An avascular window in in the peritoneum was created and the utero-ovarian ligament was cross clamped, cut and suture ligated.  The right round ligament was suture ligated and cut with the electrocautery unit.  The vesicouterine serosal flap on the right was created.  An avascular window in the peritoneum was created and the right utero-ovarian ligament was cross clamped, cut and double suture ligated.  Thus both ovaries were preserved.  The uterine vessels were skeletonized bilaterally.  The uterine vessels were clamped bilaterally,  then cut and suture ligated.  Two more pedicles were taken down the cervix medial to the  uterine vessels.  Each pedicle was clamped cut and suture ligated with good resulting hemostasis.  The bladder was taken down sharply. The vagina was cross clamped and the cervix removed.  The vagina was closed with interrupted figure of eight sutures with good hemostasis.  The pelvis was irrigated vigorously and all pedicles were examined and found to be hemostatic.  Arista was placed.  All specimens were sent to pathology for routine evaluation.  The Alexis self-retaining retractor was removed and the pelvis was irrigated vigorously.  All packs were removed and all counts were correct at this point x 3.  The muscles and peritoneum were reapproximated loosely.  The fascia was closed with 0 Vicryl running.    The skin was closed using 3-0 Vicryl on a Keith needle in a subcuticular fashion.  Dermabond was then applied for additional wound integrity and to serve as a postoperative bacterial barrier.  The  patient was awakened from anesthesia taken to the recovery room in good stable condition. All sponge instrument and needle counts were correct x 3.  The patient received 2 grams Ancef prophylactically preoperatively.  Estimated blood loss for the procedure was 150  cc.  [name] 09/16/2018 11:48 AM

## 2018-09-16 NOTE — Anesthesia Preprocedure Evaluation (Addendum)
Anesthesia Evaluation  Patient identified by MRN, date of birth, ID band Patient awake    Reviewed: Allergy & Precautions, H&P , NPO status , Patient's Chart, lab work & pertinent test results  History of Anesthesia Complications (+) PONV and history of anesthetic complications  Airway Mallampati: II  TM Distance: >3 FB Neck ROM: full    Dental no notable dental hx.    Pulmonary neg pulmonary ROS,    Pulmonary exam normal breath sounds clear to auscultation       Cardiovascular Exercise Tolerance: Good negative cardio ROS   Rhythm:regular Rate:Normal     Neuro/Psych Anxiety negative neurological ROS  negative psych ROS   GI/Hepatic negative GI ROS, Neg liver ROS,   Endo/Other  negative endocrine ROS  Renal/GU negative Renal ROS  negative genitourinary   Musculoskeletal   Abdominal   Peds  Hematology negative hematology ROS (+)   Anesthesia Other Findings   Reproductive/Obstetrics negative OB ROS                            Anesthesia Physical Anesthesia Plan  ASA: II  Anesthesia Plan: General   Post-op Pain Management:    Induction:   PONV Risk Score and Plan:   Airway Management Planned:   Additional Equipment:   Intra-op Plan:   Post-operative Plan:   Informed Consent: I have reviewed the patients History and Physical, chart, labs and discussed the procedure including the risks, benefits and alternatives for the proposed anesthesia with the patient or authorized representative who has indicated his/her understanding and acceptance.   Dental Advisory Given  Plan Discussed with: CRNA  Anesthesia Plan Comments:        Anesthesia Quick Evaluation                                  Anesthesia Evaluation

## 2018-09-17 ENCOUNTER — Encounter (HOSPITAL_COMMUNITY): Payer: Self-pay | Admitting: Obstetrics & Gynecology

## 2018-09-17 LAB — BASIC METABOLIC PANEL
Anion gap: 8 (ref 5–15)
BUN: 7 mg/dL (ref 6–20)
CALCIUM: 8.8 mg/dL — AB (ref 8.9–10.3)
CHLORIDE: 110 mmol/L (ref 98–111)
CO2: 21 mmol/L — ABNORMAL LOW (ref 22–32)
CREATININE: 0.74 mg/dL (ref 0.44–1.00)
GFR calc Af Amer: >60 mL/min (ref >60)
GFR calc non Af Amer: >60 mL/min (ref >60)
Glucose, Bld: 122 mg/dL — ABNORMAL HIGH (ref 70–99)
Potassium: 4 mmol/L (ref 3.5–5.1)
SODIUM: 139 mmol/L (ref 135–145)

## 2018-09-17 LAB — CBC
HEMATOCRIT: 39.6 % (ref 36.0–46.0)
Hemoglobin: 12.3 g/dL (ref 12.0–15.0)
MCH: 27.6 pg (ref 26.0–34.0)
MCHC: 31.1 g/dL (ref 30.0–36.0)
MCV: 88.8 fL (ref 80.0–100.0)
NRBC: 0 % (ref 0.0–0.2)
PLATELETS: 167 10*3/uL (ref 150–400)
RBC: 4.46 MIL/uL (ref 3.87–5.11)
RDW: 13.4 % (ref 11.5–15.5)
WBC: 10 10*3/uL (ref 4.0–10.5)

## 2018-09-17 MED ORDER — OXYCODONE-ACETAMINOPHEN 7.5-325 MG PO TABS: 1.0000 | ORAL_TABLET | ORAL | 0 refills | Status: DC | PRN

## 2018-09-17 MED ORDER — OXYCODONE-ACETAMINOPHEN 7.5-325 MG PO TABS
1.0000 | ORAL_TABLET | ORAL | Status: DC | PRN
  Administered 2018-09-17: 1 via ORAL
  Filled 2018-09-17: qty 1

## 2018-09-17 MED ORDER — ONDANSETRON 8 MG PO TBDP: 8.0000 mg | ORAL_TABLET | Freq: Three times a day (TID) | ORAL | 0 refills | Status: DC | PRN

## 2018-09-17 NOTE — Discharge Summary (Signed)
Physician Discharge Summary  Patient ID: Gwendolyn Glass MRN: [id number] DOB/AGE: [birth year] 35 y.o.  Admit date: 09/16/2018 Discharge date: 09/17/2018  Admission Diagnoses: S/p hysterectomy  Discharge Diagnoses:  Active Problems:   S/P hysterectomy   Discharged Condition: stable  Hospital Course: unremarkable  Consults: None  Significant Diagnostic Studies: labs:  Results for orders placed or performed during the hospital encounter of 09/16/18 (from the past 24 hour(s))  CBC     Status: None   Collection Time: 09/17/18  5:25 AM  Result Value Ref Range   WBC 10.0 4.0 - 10.5 K/uL   RBC 4.46 3.87 - 5.11 MIL/uL   Hemoglobin 12.3 12.0 - 15.0 g/dL   HCT 39.6 36.0 - 46.0 %   MCV 88.8 80.0 - 100.0 fL   MCH 27.6 26.0 - 34.0 pg   MCHC 31.1 30.0 - 36.0 g/dL   RDW 13.4 11.5 - 15.5 %   Platelets 167 150 - 400 K/uL   nRBC 0.0 0.0 - 0.2 %  Basic metabolic panel     Status: Abnormal   Collection Time: 09/17/18  5:25 AM  Result Value Ref Range   Sodium 139 135 - 145 mmol/L   Potassium 4.0 3.5 - 5.1 mmol/L   Chloride 110 98 - 111 mmol/L   CO2 21 (L) 22 - 32 mmol/L   Glucose, Bld 122 (H) 70 - 99 mg/dL   BUN 7 6 - 20 mg/dL   Creatinine, Ser 0.74 0.44 - 1.00 mg/dL   Calcium 8.8 (L) 8.9 - 10.3 mg/dL   GFR calc non Af Amer >60 >60 mL/min   GFR calc Af Amer >60 >60 mL/min   Anion gap 8 5 - 15    Treatments: surgery: TAH  Discharge Exam: Blood pressure 115/79, pulse 94, temperature 99 F (37.2 C), temperature source Oral, resp. rate 20, height 5\' 8"  (1.727 m), SpO2 97 %. General appearance: alert, cooperative and no distress GI: soft, non-tender; bowel sounds normal; no masses,  no organomegaly Incision/Wound:clean dry intact  Disposition: Discharge disposition: 01-Home or Self Care       Discharge Instructions    Call MD for:  persistant nausea and vomiting   Complete by:  As directed    Call MD for:  severe uncontrolled pain   Complete by:  As directed    Call MD  for:  temperature >100.4   Complete by:  As directed    Diet - low sodium heart healthy   Complete by:  As directed    Driving Restrictions   Complete by:  As directed    No driving for 1 week   Increase activity slowly   Complete by:  As directed    Leave dressing on - Keep it clean, dry, and intact until clinic visit   Complete by:  As directed    Lifting restrictions   Complete by:  As directed    Do not lift more than 10 pounds for 6 weeks   Sexual Activity Restrictions   Complete by:  As directed    I know you are kidding right?     Allergies as of 09/17/2018      Reactions   Cinnamon Swelling, Other (See Comments)   REACTION: throat swells   Bee Venom Swelling, Other (See Comments)   REACTION: All over body swelling   Vicodin [hydrocodone-acetaminophen] Nausea And Vomiting   Dilaudid [hydromorphone Hcl] Hives   Toradol [ketorolac Tromethamine]    Hives, and chest pain  Medication List    STOP taking these medications   megestrol 40 MG tablet Commonly known as:  MEGACE     TAKE these medications   ALPRAZolam 1 MG tablet Commonly known as:  XANAX Take 1 tablet by mouth 2 (two) times daily.   cholestyramine light 4 GM/DOSE powder Commonly known as:  PREVALITE Take 4 g by mouth 2 (two) times daily with a meal.   multivitamin with minerals tablet Take 2 tablets by mouth daily.   ondansetron 8 MG disintegrating tablet Commonly known as:  ZOFRAN-ODT Take 1 tablet (8 mg total) by mouth every 8 (eight) hours as needed for nausea or vomiting. What changed:  Another medication with the same name was added. Make sure you understand how and when to take each.   ondansetron 8 MG disintegrating tablet Commonly known as:  ZOFRAN-ODT Take 1 tablet (8 mg total) by mouth every 8 (eight) hours as needed for nausea or vomiting. What changed:  You were already taking a medication with the same name, and this prescription was added. Make sure you understand how and when  to take each.   oxyCODONE-acetaminophen 7.5-325 MG tablet Commonly known as:  PERCOCET Take 1-2 tablets by mouth every 4 (four) hours as needed for moderate pain or severe pain.      Follow-up Information    Florian Buff, MD Follow up on 09/24/2018.   Specialties:  Obstetrics and Gynecology, Radiology Contact information: [place] [id number] [phone]           Signed: Florian Buff 09/17/2018, 1:42 PM

## 2018-09-17 NOTE — Progress Notes (Signed)
IV removed, patient tolerated well.  Reviewed AVS with patient who verbalized understanding.  Patient transported home by a friend.

## 2018-09-17 NOTE — Discharge Instructions (Signed)
Abdominal Hysterectomy, Care After °This sheet gives you information about how to care for yourself after your procedure. Your health care provider may also give you more specific instructions. If you have problems or questions, contact your health care provider. °What can I expect after the procedure? °After your procedure, it is common to have: °· Pain. °· Fatigue. °· Poor appetite. °· Less interest in sex. °· Vaginal bleeding and discharge. You may need to use a sanitary napkin after this procedure. ° °Follow these instructions at home: °Bathing °· Do not take baths, swim, or use a hot tub until your health care provider approves. Ask your health care provider if you can take showers. You may only be allowed to take sponge baths for bathing. °· Keep the bandage (dressing) dry until your health care provider says it can be removed. °Incision care °· Follow instructions from your health care provider about how to take care of your incision. Make sure you: °? Wash your hands with soap and water before you change your bandage (dressing). If soap and water are not available, use hand sanitizer. °? Change your dressing as told by your health care provider. °? Leave stitches (sutures), skin glue, or adhesive strips in place. These skin closures may need to stay in place for 2 weeks or longer. If adhesive strip edges start to loosen and curl up, you may trim the loose edges. Do not remove adhesive strips completely unless your health care provider tells you to do that. °· Check your incision area every day for signs of infection. Check for: °? Redness, swelling, or pain. °? Fluid or blood. °? Warmth. °? Pus or a bad smell. °Activity °· Do gentle, daily exercises as told by your health care provider. You may be told to take short walks every day and go farther each time. °· Do not lift anything that is heavier than 10 lb (4.5 kg), or the limit that your health care provider tells you, until he or she says that it is  safe. °· Do not drive or use heavy machinery while taking prescription pain medicine. °· Do not drive for 24 hours if you were given a medicine to help you relax (sedative). °· Follow your health care provider's instructions about exercise, driving, and general activities. Ask your health care provider what activities are safe for you. °Lifestyle °· Do not douche, use tampons, or have sex for at least 6 weeks or as told by your health care provider. °· Do not drink alcohol until your health care provider approves. °· Drink enough fluid to keep your urine clear or pale yellow. °· Try to have someone at home with you for the first 1-2 weeks to help. °· Do not use any products that contain nicotine or tobacco, such as cigarettes and e-cigarettes. These can delay healing. If you need help quitting, ask your health care provider. °General instructions °· Take over-the-counter and prescription medicines only as told by your health care provider. °· Do not take aspirin or ibuprofen. These medicines can cause bleeding. °· To prevent or treat constipation while you are taking prescription pain medicine, your health care provider may recommend that you: °? Drink enough fluid to keep your urine clear or pale yellow. °? Take over-the-counter or prescription medicines. °? Eat foods that are high in fiber, such as fresh fruits and vegetables, whole grains, and beans. °? Limit foods that are high in fat and processed sugars, such as fried and sweet foods. °· Keep all   follow-up visits as told by your health care provider. This is important. °Contact a health care provider if: °· You have chills or fever. °· You have redness, swelling, or pain around your incision. °· You have fluid or blood coming from your incision. °· Your incision feels warm to the touch. °· You have pus or a bad smell coming from your incision. °· Your incision breaks open. °· You feel dizzy or light-headed. °· You have pain or bleeding when you urinate. °· You  have persistent diarrhea. °· You have persistent nausea and vomiting. °· You have abnormal vaginal discharge. °· You have a rash. °· You have any type of abnormal reaction or you develop an allergy to your medicine. °· Your pain medicine does not help. °Get help right away if: °· You have a fever and your symptoms suddenly get worse. °· You have severe abdominal pain. °· You have shortness of breath. °· You faint. °· You have pain, swelling, or redness in your leg. °· You have heavy vaginal bleeding with blood clots. °Summary °· After your procedure, it is common to have pain, fatigue and vaginal discharge. °· Do not take baths, swim, or use a hot tub until your health care provider approves. Ask your health care provider if you can take showers. You may only be allowed to take sponge baths for bathing. °· Follow your health care provider's instructions about exercise, driving, and general activities. Ask your health care provider what activities are safe for you. °· Do not lift anything that is heavier than 10 lb (4.5 kg), or the limit that your health care provider tells you, until he or she says that it is safe. °· Try to have someone at home with you for the first 1-2 weeks to help. °This information is not intended to replace advice given to you by your health care provider. Make sure you discuss any questions you have with your health care provider. °Document Released: 06/07/2005 Document Revised: 11/06/2016 Document Reviewed: 11/06/2016 °Elsevier Interactive Patient Education © 2017 [employer] ° °

## 2018-09-24 ENCOUNTER — Encounter: Payer: BLUE CROSS/BLUE SHIELD | Admitting: Obstetrics & Gynecology

## 2018-09-24 ENCOUNTER — Encounter: Payer: Self-pay | Admitting: Obstetrics & Gynecology

## 2018-09-24 ENCOUNTER — Other Ambulatory Visit: Payer: Self-pay

## 2018-09-24 ENCOUNTER — Ambulatory Visit (INDEPENDENT_AMBULATORY_CARE_PROVIDER_SITE_OTHER): Payer: BLUE CROSS/BLUE SHIELD | Admitting: Obstetrics & Gynecology

## 2018-09-24 VITALS — BP 153/94 | HR 147 | Ht 68.0 in | Wt 261.0 lb

## 2018-09-24 DIAGNOSIS — Z90710 Acquired absence of both cervix and uterus: Secondary | ICD-10-CM

## 2018-09-24 DIAGNOSIS — Z9889 Other specified postprocedural states: Secondary | ICD-10-CM

## 2018-09-24 MED ORDER — OXYCODONE-ACETAMINOPHEN 5-325 MG PO TABS: 1.0000 | ORAL_TABLET | ORAL | 0 refills | Status: DC | PRN

## 2018-09-24 NOTE — Progress Notes (Signed)
  HPI: Patient returns for routine postoperative follow-up having undergone abdominal supracervical hysterectomy on 09/16/2018.  The patient's immediate postoperative recovery has been unremarkable. Since hospital discharge the patient reports normal post op pain.   Current Outpatient Medications: ALPRAZolam (XANAX) 1 MG tablet, Take 1 tablet by mouth 2 (two) times daily. , Disp: , Rfl: 0 cholestyramine light (PREVALITE) 4 GM/DOSE powder, Take 4 g by mouth 2 (two) times daily with a meal., Disp: , Rfl:  Multiple Vitamins-Minerals (MULTIVITAMIN WITH MINERALS) tablet, Take 2 tablets by mouth daily., Disp: , Rfl:  ondansetron (ZOFRAN ODT) 8 MG disintegrating tablet, Take 1 tablet (8 mg total) by mouth every 8 (eight) hours as needed for nausea or vomiting., Disp: 20 tablet, Rfl: 0 oxyCODONE-acetaminophen (PERCOCET) 7.5-325 MG tablet, Take 1-2 tablets by mouth every 4 (four) hours as needed for moderate pain or severe pain., Disp: 30 tablet, Rfl: 0  No current facility-administered medications for this visit.     Blood pressure (!) 153/94, pulse (!) 147, height 5\' 8"  (1.727 m), weight 261 lb (118.4 kg), last menstrual period 04/11/2017.  Physical Exam: incison clean dry intact  Diagnostic Tests:   Pathology: benign  Impression: S/p abdominal supracervical hysterectomy  Plan: Meds ordered this encounter  Medications  . oxyCODONE-acetaminophen (PERCOCET/ROXICET) 5-325 MG tablet    Sig: Take 1 tablet by mouth every 4 (four) hours as needed for severe pain.    Dispense:  20 tablet    Refill:  0    Follow up: 4  weeks  Florian Buff, MD

## 2018-10-12 ENCOUNTER — Telehealth: Payer: Self-pay | Admitting: Obstetrics & Gynecology

## 2018-10-12 MED ORDER — CEPHALEXIN 500 MG PO CAPS: 500.0000 mg | ORAL_CAPSULE | Freq: Three times a day (TID) | ORAL | 0 refills | Status: DC

## 2018-10-22 ENCOUNTER — Ambulatory Visit (INDEPENDENT_AMBULATORY_CARE_PROVIDER_SITE_OTHER): Payer: BLUE CROSS/BLUE SHIELD | Admitting: Obstetrics & Gynecology

## 2018-10-22 ENCOUNTER — Encounter: Payer: Self-pay | Admitting: Obstetrics & Gynecology

## 2018-10-22 VITALS — BP 137/87 | HR 128 | Ht 68.0 in | Wt 255.0 lb

## 2018-10-22 DIAGNOSIS — Z90710 Acquired absence of both cervix and uterus: Secondary | ICD-10-CM

## 2018-10-22 MED ORDER — SILVER SULFADIAZINE 1 % EX CREA: TOPICAL_CREAM | CUTANEOUS | 11 refills | Status: DC

## 2018-10-22 NOTE — Progress Notes (Signed)
  HPI: Patient returns for routine postoperative follow-up having undergone TAH on 09/16/2018.  The patient's immediate postoperative recovery has been unremarkable. Since hospital discharge the patient reports doing well.   Current Outpatient Medications: acetaminophen (TYLENOL) 500 MG tablet, Take 500 mg by mouth as needed., Disp: , Rfl:  ALPRAZolam (XANAX) 1 MG tablet, Take 1 tablet by mouth 2 (two) times daily. , Disp: , Rfl: 0 cholestyramine light (PREVALITE) 4 GM/DOSE powder, Take 4 g by mouth 2 (two) times daily with a meal., Disp: , Rfl:  Multiple Vitamins-Minerals (MULTIVITAMIN WITH MINERALS) tablet, Take 2 tablets by mouth daily., Disp: , Rfl:   No current facility-administered medications for this visit.     Blood pressure 137/87, pulse (!) 128, height 5\' 8"  (1.727 m), weight 255 lb (115.7 kg), last menstrual period 04/11/2017.  Physical Exam: Incision clean dry intact  Diagnostic Tests:   Pathology: benign  Impression: S/P TAH Small staph infection left lateral, use silvadene  Plan: Meds ordered this encounter  Medications  . silver sulfADIAZINE (SILVADENE) 1 % cream    Sig: Apply to area TID    Dispense:  50 g    Refill:  11     Follow up: 1  years  Florian Buff, MD

## 2019-10-26 ENCOUNTER — Ambulatory Visit: Payer: BLUE CROSS/BLUE SHIELD | Admitting: Cardiovascular Disease

## 2020-07-20 ENCOUNTER — Encounter: Payer: Self-pay | Admitting: Physician Assistant

## 2020-08-29 ENCOUNTER — Ambulatory Visit: Payer: BC Managed Care – PPO | Admitting: Physician Assistant

## 2020-08-29 ENCOUNTER — Telehealth: Payer: Self-pay | Admitting: Physician Assistant

## 2020-08-29 ENCOUNTER — Encounter: Payer: Self-pay | Admitting: Physician Assistant

## 2020-08-29 VITALS — BP 130/82 | HR 109 | Ht 68.0 in | Wt 260.4 lb

## 2020-08-29 DIAGNOSIS — R1084 Generalized abdominal pain: Secondary | ICD-10-CM | POA: Diagnosis not present

## 2020-08-29 DIAGNOSIS — R112 Nausea with vomiting, unspecified: Secondary | ICD-10-CM | POA: Diagnosis not present

## 2020-08-29 DIAGNOSIS — R197 Diarrhea, unspecified: Secondary | ICD-10-CM | POA: Diagnosis not present

## 2020-08-29 MED ORDER — DICYCLOMINE HCL 20 MG PO TABS: 20.0000 mg | ORAL_TABLET | Freq: Three times a day (TID) | ORAL | 2 refills | Status: DC

## 2020-08-29 MED ORDER — SUTAB 1479-225-188 MG PO TABS: 1.0000 | ORAL_TABLET | Freq: Once | ORAL | 0 refills | Status: AC

## 2020-08-29 NOTE — Progress Notes (Signed)
Chief Complaint: Generalized abdominal pain, nausea, vomiting, diarrhea  HPI:    Mrs. Giarrusso is a 39 year old female with a past medical history as listed below, who was referred to me by Lanelle Bal, PA-C for a complaint of generalized abdominal pain, nausea, vomiting and diarrhea.     07/31/2020 patient had an abdominal ultrasound for nausea which was present for 10 years.  Patient found to be status post cholecystectomy and had an echogenic liver consistent with steatosis.    08/04/2020 patient saw urgent care for back pain and was started on Prednisone and a muscle relaxer.    Today, the patient tells me that for at least 10 years now she has had terrible GI symptoms which are now altering her life.  Describes that years ago it started with just nausea and sometimes she would vomit, this seemed to be worse when she would go out drinking so she stopped drinking but nothing changed.  She then developed generalized abdominal cramping which would develop into diarrhea pretty much anytime that she would eat and alcohol seemed to make it worse so she stopped drinking completely.  Since then she has had an appendectomy, a cholecystectomy and a hysterectomy and attempts to solve these issues over the years, but it just "seems to get worse".  Apparently was in [place] prior to living here and in the ER they mentioned possibly Crohn's but she has never seen GI physician in the past.  Her PCP started Cholestyramine about 5 years ago which "maybe helped a little bit", so she has continued this twice daily.  Continues with extreme abdominal pain, rated as a 10/10 at its worst, nausea, vomiting and diarrhea pretty much after eating anything and now has also had some issues with fecal incontinence due to the urgency.  Recently patient came positive for alpha gal syndrome about 3 weeks ago and has completely altered her diet but regardless of this continues with symptoms.  Tells me she has tried everything  over-the-counter to help with things but nothing really seems to be working.  She uses Omeprazole 40 mg daily and Phenergan as needed for nausea.    Patient tells me she has battled with depression and anxiety due to the symptoms and the fact that she just cannot go out anywhere for fear of having multiple loose urgent stools.    Denies fever, chills or blood in her stool.  Past Medical History:  Diagnosis Date  . Anxiety   . Hallmon recluse spider bite 02/27/2016  . C. difficile diarrhea 02/29/2016  . Miscarriage    pt. states shes  spotting  and wearing a tampon  . Ovarian mass, right 03/01/2016   ? complex cyst  . Pregnant     Past Surgical History:  Procedure Laterality Date  . ABDOMINAL HYSTERECTOMY N/A 09/16/2018   Procedure: HYSTERECTOMY ABDOMINAL;  Surgeon: Florian Buff, MD;  Location: [facility];  Service: Gynecology;  Laterality: N/A;  . APPENDECTOMY    . CESAREAN SECTION    . CHOLECYSTECTOMY    . CHOLECYSTECTOMY  07/28/2012   Procedure: LAPAROSCOPIC CHOLECYSTECTOMY;  Surgeon: Donato Heinz, MD;  Location: [facility];  Service: General;  Laterality: N/A;  . LAPAROSCOPY N/A 09/07/2014   Procedure: LAPAROSCOPY OPERATIVE;  Surgeon: Woodroe Mode, MD;  Location: [facility];  Service: Gynecology;  Laterality: N/A;  . removal of lt fallopian tube    . UNILATERAL SALPINGECTOMY Right 09/07/2014   Procedure: UNILATERAL SALPINGECTOMY;  Surgeon: Woodroe Mode, MD;  Location:  [facility];  Service: Gynecology;  Laterality: Right;  . WOUND DEBRIDEMENT Right 02/28/2016   Procedure: DEBRIDEMENT OF RIGHT SHOULDER SPIDER BITE;  Surgeon: Aviva Signs, MD;  Location: [facility];  Service: General;  Laterality: Right;    Current Outpatient Medications  Medication Sig Dispense Refill  . acetaminophen (TYLENOL) 500 MG tablet Take 500 mg by mouth as needed.    . ALPRAZolam (XANAX) 1 MG tablet Take 1 tablet by mouth 2 (two) times daily.   0  . cholestyramine light (PREVALITE) 4 GM/DOSE powder Take 4 g by mouth 2  (two) times daily with a meal.    . Multiple Vitamins-Minerals (MULTIVITAMIN WITH MINERALS) tablet Take 2 tablets by mouth daily.    . silver sulfADIAZINE (SILVADENE) 1 % cream Apply to area TID 50 g 11   No current facility-administered medications for this visit.    Allergies as of 08/29/2020 - Review Complete 10/22/2018  Allergen Reaction Noted  . Cinnamon Swelling and Other (See Comments) 07/17/2012  . Bee venom Swelling and Other (See Comments) 07/17/2012  . Vicodin [hydrocodone-acetaminophen] Nausea And Vomiting 09/09/2012  . Dilaudid [hydromorphone hcl] Hives 04/18/2014  . Toradol [ketorolac tromethamine]  09/09/2012    No family history on file.  Social History   Socioeconomic History  . Marital status: Married    Spouse name: Not on file  . Number of children: Not on file  . Years of education: Not on file  . Highest education level: Not on file  Occupational History  . Not on file  Tobacco Use  . Smoking status: Current Every Day Smoker    Packs/day: 0.50    Years: 5.00    Pack years: 2.50    Types: Cigarettes  . Smokeless tobacco: Never Used  Vaping Use  . Vaping Use: Never used  Substance and Sexual Activity  . Alcohol use: Not Currently    Comment: occ.  . Drug use: No  . Sexual activity: Not Currently    Birth control/protection: Surgical    Comment: hyst  Other Topics Concern  . Not on file  Social History Narrative  . Not on file   Social Determinants of Health   Financial Resource Strain:   . Difficulty of Paying Living Expenses: Not on file  Food Insecurity:   . Worried About Charity fundraiser in the Last Year: Not on file  . Ran Out of Food in the Last Year: Not on file  Transportation Needs:   . Lack of Transportation (Medical): Not on file  . Lack of Transportation (Non-Medical): Not on file  Physical Activity:   . Days of Exercise per Week: Not on file  . Minutes of Exercise per Session: Not on file  Stress:   . Feeling of Stress  : Not on file  Social Connections:   . Frequency of Communication with Friends and Family: Not on file  . Frequency of Social Gatherings with Friends and Family: Not on file  . Attends Religious Services: Not on file  . Active Member of Clubs or Organizations: Not on file  . Attends Archivist Meetings: Not on file  . Marital Status: Not on file  Intimate Partner Violence:   . Fear of Current or Ex-Partner: Not on file  . Emotionally Abused: Not on file  . Physically Abused: Not on file  . Sexually Abused: Not on file    Review of Systems:    Constitutional: No weight loss, fever or chills Skin: No rash  Cardiovascular: No chest pain  Respiratory: No SOB  Gastrointestinal: See HPI and otherwise negative Genitourinary: No dysuria  Neurological: No headache, dizziness or syncope Musculoskeletal: No new muscle or joint pain Hematologic: No bleeding  Psychiatric: No history of depression or anxiety   Physical Exam:  Vital signs: BP 130/82 (BP Location: Right Wrist, Patient Position: Sitting, Cuff Size: Large)   Pulse (!) 109   Ht 5\' 8"  (1.727 m)   Wt 260 lb 6.4 oz (118.1 kg)   LMP 04/11/2017 (Exact Date) Comment: has had continual periods  SpO2 98%   BMI 39.59 kg/m   Constitutional:   Pleasant Caucasian female appears to be in NAD, Well developed, Well nourished, alert and cooperative Head:  Normocephalic and atraumatic. Eyes:   PEERL, EOMI. No icterus. Conjunctiva pink. Ears:  Normal auditory acuity. Neck:  Supple Throat: Oral cavity and pharynx without inflammation, swelling or lesion.  Respiratory: Respirations even and unlabored. Lungs clear to auscultation bilaterally.   No wheezes, crackles, or rhonchi.  Cardiovascular: Normal S1, S2. No MRG. Regular rate and rhythm. No peripheral edema, cyanosis or pallor.  Gastrointestinal:  Soft, nondistended, mild generalized ttp, No rebound or guarding. Normal bowel sounds. No appreciable masses or hepatomegaly. Rectal:   Not performed.  Msk:  Symmetrical without gross deformities. Without edema, no deformity or joint abnormality.  Neurologic:  Alert and  oriented x4;  grossly normal neurologically.  Skin:   Dry and intact without significant lesions or rashes. Psychiatric: Demonstrates good judgement and reason without abnormal affect or behaviors.  No recent labs or imaging.  Assessment: 1.  Generalized abdominal pain: With symptoms below 2.  Diarrhea: After eating about 20 times a day with abdominal cramping for the past 10 years; consider IBD versus IBS versus other 3.  Nausea and vomiting: After eating most foods, started 10 years ago; consider IBD versus IBS versus gastritis+/-PUD   Plan: 1.  Scheduled patient for diagnostic EGD and colonoscopy in the [facility] with Dr. Rush Landmark.  Did discuss risks, benefits, limitations and alternatives and patient agrees to proceed.  She has had her Covid vaccines. 2.  Prescribe Dicyclomine 20 mg 4 times daily, 20 to 30 minutes before meals and at bedtime #120 with 2 refills. 3.  Continue Cholestyramine for now. 4.  Patient to follow in clinic per recommendations from Dr. Rush Landmark after time of procedures.  Ellouise Newer, PA-C [facility] 08/29/2020, 2:21 PM  Cc: Lanelle Bal, PA-C

## 2020-08-29 NOTE — Telephone Encounter (Signed)
Pt is wondering if Gwendolyn Glass could send over the medication (anti spasm). Pt does not remember the name of the medication.

## 2020-08-29 NOTE — Patient Instructions (Addendum)
If you are age 37 or older, your body mass index should be between 23-30. Your Body mass index is 39.59 kg/m. If this is out of the aforementioned range listed, please consider follow up with your Primary Care Provider.  If you are age 77 or younger, your body mass index should be between 19-25. Your Body mass index is 39.59 kg/m. If this is out of the aformentioned range listed, please consider follow up with your Primary Care Provider.   We have sent the following medications to your pharmacy for you to pick up at your convenience: Dicyclomine 20 mg four times daily 20-30 minutes before meals and bedtime.  You have been scheduled for an endoscopy and colonoscopy. Please follow the written instructions given to you at your visit today. Please pick up your prep supplies at the pharmacy within the next 1-3 days. If you use inhalers (even only as needed), please bring them with you on the day of your procedure.  Due to recent changes in healthcare laws, you may see the results of your imaging and laboratory studies on MyChart before your provider has had a chance to review them.  We understand that in some cases there may be results that are confusing or concerning to you. Not all laboratory results come back in the same time frame and the provider may be waiting for multiple results in order to interpret others.  Please give [place] 48 hours in order for your provider to thoroughly review all the results before contacting the office for clarification of your results.

## 2020-08-29 NOTE — Telephone Encounter (Signed)
Medication sent to pharmacy  

## 2020-08-29 NOTE — Addendum Note (Signed)
Addended by: Horris Latino on: 08/29/2020 03:41 PM   Modules accepted: Orders

## 2020-09-01 NOTE — Progress Notes (Signed)
Attending Physician's Attestation   I have reviewed the chart.   I agree with the Advanced Practitioner's note, impression, and recommendations with any updates as below. Further evaluation endoscopically is reasonable.  Would be helpful to try and understand where the question of Crohn's disease came up but was not managing or not.  Otherwise imaging may be required as well here.  EPI evaluation and SIBO evaluation will need to be considered as well depending on the findings at time of endoscopy.   Justice Britain, MD [facility] Advanced Endoscopy Office # [phone]

## 2020-09-27 ENCOUNTER — Ambulatory Visit (INDEPENDENT_AMBULATORY_CARE_PROVIDER_SITE_OTHER): Payer: BC Managed Care – PPO | Admitting: Allergy & Immunology

## 2020-09-27 ENCOUNTER — Other Ambulatory Visit: Payer: Self-pay

## 2020-09-27 ENCOUNTER — Encounter: Payer: Self-pay | Admitting: Allergy & Immunology

## 2020-09-27 VITALS — BP 120/90 | HR 101 | Temp 98.7°F | Resp 18 | Ht 68.0 in | Wt 253.2 lb

## 2020-09-27 DIAGNOSIS — R197 Diarrhea, unspecified: Secondary | ICD-10-CM

## 2020-09-27 DIAGNOSIS — J302 Other seasonal allergic rhinitis: Secondary | ICD-10-CM | POA: Diagnosis not present

## 2020-09-27 DIAGNOSIS — J3089 Other allergic rhinitis: Secondary | ICD-10-CM | POA: Diagnosis not present

## 2020-09-27 DIAGNOSIS — T7800XD Anaphylactic reaction due to unspecified food, subsequent encounter: Secondary | ICD-10-CM

## 2020-09-27 NOTE — Patient Instructions (Addendum)
1. Anaphylactic shock due to food -Testing was only reactive slightly to soy and onion. -Other testing was negative to red meats, the blood work is more sensitive than the prick testing. -Copy of test results provided. -I am going to get some labs to rule out pheochromocytoma and carcinoid syndrome. -These are syndromes that can present with chronic diarrhea and flushing.  2. Seasonal and perennial allergic rhinitis - Testing today showed: grasses, ragweed, indoor molds, outdoor molds, dust mites, cat and dog - Copy of test results provided.  - Avoidance measures provided. - Continue with: Allegra (fexofenadine) 180mg  tablet once daily - You can use an extra dose of the antihistamine (Allegra), if needed, for breakthrough symptoms.  - Consider nasal saline rinses 1-2 times daily to remove allergens from the nasal cavities as well as help with mucous clearance (this is especially helpful to do before the nasal sprays are given) - We could consider allergy shots as a means of long-term control, but I am not sure that they are going to fix your GI symptoms.   3. Diarrhea, unspecified type -I am glad that Dr. Rush Landmark is seeing you soon. - I will send my notes to him so that he is aware of what we have done. - We are going to get your labs that you had done already.   4. Return in about 2 months (around 11/27/2020).    Please inform us of any Emergency Department visits, hospitalizations, or changes in symptoms. Call us before going to the ED for breathing or allergy symptoms since we might be able to fit you in for a sick visit. Feel free to contact us anytime with any questions, problems, or concerns.  It was a pleasure to meet you today!  Websites that have reliable patient information: 1. American Academy of Asthma, Allergy, and Immunology: www.aaaai.org 2. Food Allergy Research and Education (FARE): foodallergy.org 3. Mothers of Asthmatics: http://www.asthmacommunitynetwork.org 4.  American College of Allergy, Asthma, and Immunology: www.acaai.org   COVID-19 Vaccine Information can be found at: [email or web address].uk For questions related to vaccine distribution or appointments, please email [email or web address] or call [phone].     "Like" [place] on Facebook and Instagram for our latest updates!     HAPPY FALL!     Make sure you are registered to vote! If you have moved or changed any of your contact information, you will need to get this updated before voting!  In some cases, you MAY be able to register to vote online: [email or web address]     Reducing Pollen Exposure  The American Academy of Allergy, Asthma and Immunology suggests the following steps to reduce your exposure to pollen during allergy seasons.    1. Do not hang sheets or clothing out to dry; pollen may collect on these items. 2. Do not mow lawns or spend time around freshly cut grass; mowing stirs up pollen. 3. Keep windows closed at night.  Keep car windows closed while driving. 4. Minimize morning activities outdoors, a time when pollen counts are usually at their highest. 5. Stay indoors as much as possible when pollen counts or humidity is high and on windy days when pollen tends to remain in the air longer. 6. Use air conditioning when possible.  Many air conditioners have filters that trap the pollen spores. 7. Use a HEPA room air filter to remove pollen form the indoor air you breathe.  Control of Mold Allergen   Mold and fungi can grow on a variety of surfaces provided certain temperature  and moisture conditions exist.  Outdoor molds grow on plants, decaying vegetation and soil.  The major outdoor mold, Alternaria and Cladosporium, are found in very high numbers during hot and dry conditions.  Generally, a late Summer - Fall peak is seen for common outdoor fungal spores.  Rain will temporarily lower  outdoor mold spore count, but counts rise rapidly when the rainy period ends.  The most important indoor molds are Aspergillus and Penicillium.  Dark, humid and poorly ventilated basements are ideal sites for mold growth.  The next most common sites of mold growth are the bathroom and the kitchen.  Outdoor (Seasonal) Mold Control  Positive outdoor molds via skin testing: Bipolaris (Helminthsporium), Drechslera (Curvalaria) and Mucor  1. Use air conditioning and keep windows closed 2. Avoid exposure to decaying vegetation. 3. Avoid leaf raking. 4. Avoid grain handling. 5. Consider wearing a face mask if working in moldy areas.   Indoor (Perennial) Mold Control   Positive indoor molds via skin testing: Aspergillus, Penicillium, Fusarium, Aureobasidium (Pullulara) and Rhizopus  1. Maintain humidity below 50%. 2. Clean washable surfaces with 5% bleach solution. 3. Remove sources e.g. contaminated carpets.     Control of Dog or Cat Allergen  Avoidance is the best way to manage a dog or cat allergy. If you have a dog or cat and are allergic to dog or cats, consider removing the dog or cat from the home. If you have a dog or cat but don't want to find it a new home, or if your family wants a pet even though someone in the household is allergic, here are some strategies that may help keep symptoms at bay:  1. Keep the pet out of your bedroom and restrict it to only a few rooms. Be advised that keeping the dog or cat in only one room will not limit the allergens to that room. 2. Don't pet, hug or kiss the dog or cat; if you do, wash your hands with soap and water. 3. High-efficiency particulate air (HEPA) cleaners run continuously in a bedroom or living room can reduce allergen levels over time. 4. Regular use of a high-efficiency vacuum cleaner or a central vacuum can reduce allergen levels. 5. Giving your dog or cat a bath at least once a week can reduce airborne allergen.  Control of Dust  Mite Allergen    Dust mites play a major role in allergic asthma and rhinitis.  They occur in environments with high humidity wherever human skin is found.  Dust mites absorb humidity from the atmosphere (ie, they do not drink) and feed on organic matter (including shed human and animal skin).  Dust mites are a microscopic type of insect that you cannot see with the naked eye.  High levels of dust mites have been detected from mattresses, pillows, carpets, upholstered furniture, bed covers, clothes, soft toys and any woven material.  The principal allergen of the dust mite is found in its feces.  A gram of dust may contain 1,000 mites and 250,000 fecal particles.  Mite antigen is easily measured in the air during house cleaning activities.  Dust mites do not bite and do not cause harm to humans, other than by triggering allergies/asthma.    Ways to decrease your exposure to dust mites in your home:  1. Encase mattresses, box springs and pillows with a mite-impermeable barrier or cover   2. Wash sheets, blankets and drapes weekly in hot water (130 F) with detergent and dry them in  a dryer on the hot setting.  3. Have the room cleaned frequently with a vacuum cleaner and a damp dust-mop.  For carpeting or rugs, vacuuming with a vacuum cleaner equipped with a high-efficiency particulate air (HEPA) filter.  The dust mite allergic individual should not be in a room which is being cleaned and should wait 1 hour after cleaning before going into the room. 4. Do not sleep on upholstered furniture (eg, couches).   5. If possible removing carpeting, upholstered furniture and drapery from the home is ideal.  Horizontal blinds should be eliminated in the rooms where the person spends the most time (bedroom, study, television room).  Washable vinyl, roller-type shades are optimal. 6. Remove all non-washable stuffed toys from the bedroom.  Wash stuffed toys weekly like sheets and blankets above.   7. Reduce indoor  humidity to less than 50%.  Inexpensive humidity monitors can be purchased at most hardware stores.  Do not use a humidifier as can make the problem worse and are not recommended.

## 2020-09-27 NOTE — Progress Notes (Signed)
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  We did not have her alpha gal IgE IgE level when we first saw her, but it turns out that it is only 0.65.  Although we would treat this as positive with her symptoms, even with avoidance of mammalian meats she continues to have issues.  She has done her own research and certainly came to be visit with her own opinions on the diagnosis.  I just don't think all of the symptoms she is having can be attributed to this alpha gal IgE, which is relatively low.  We did do more extended food testing and she was only slightly reactive to soy and onion, which she is going to avoid until the next visit at least.  We did do environmental allergy testing and she has certainly positive from this perspective.  I'm not sure how this will be contributing to her GI symptoms.  She is going to see a gastroenterologist next month and I will forward the note to him just to keep him informed.  Plan/Recommendations:   1. Anaphylactic shock due to food -Testing was only reactive slightly to soy and onion. -Other testing was negative to red meats, the blood work is more sensitive than the prick testing. -Copy of test results provided. -I am going to get some labs to rule out pheochromocytoma and carcinoid syndrome. -These are syndromes that can present with chronic diarrhea and flushing.  2. Seasonal and perennial allergic rhinitis - Testing today showed: grasses, ragweed, indoor molds, outdoor molds, dust mites, cat and dog - Copy of test results provided.  - Avoidance measures provided. - Continue with: Allegra (fexofenadine) 180mg  tablet  once daily - You can use an extra dose of the antihistamine (Allegra), if needed, for breakthrough symptoms.  - Consider nasal saline rinses 1-2 times daily to remove allergens from the nasal cavities as well as help with mucous clearance (this is especially helpful to do before the nasal sprays are given) - We could consider allergy shots as a means of long-term control, but I am not sure that they are going to fix your GI symptoms.   3. Diarrhea, unspecified type -I am glad that Dr. Rush Glass is seeing you soon. - I will send my notes to him so that he is aware of what we have done. - We are going to get your labs that you had done already.   4. Return in about 2 months (around 11/27/2020).    Subjective:   Gwendolyn Glass is a 39 y.o. female presenting today for evaluation of  Chief Complaint  Patient presents with  . Food Intolerance    alpha gal    Gwendolyn Glass has a history of the following: Patient Active Problem List   Diagnosis Date Noted  . S/P hysterectomy 09/16/2018  . Cellulitis of right upper extremity   . Ovarian mass, right 03/01/2016  . RLQ abdominal pain 02/29/2016  . Nausea with vomiting 02/29/2016  . Diarrhea 02/29/2016  . C. difficile diarrhea 02/29/2016  . C. difficile colitis 02/29/2016  . Morbid obesity ([place]) 02/28/2016  . Tobacco abuse 02/28/2016  . Simonis recluse spider bite 02/27/2016  .  Ectopic pregnancy, tubal 09/07/2014    History obtained from: chart review and patient.  Gwendolyn Glass was referred by Gwendolyn Bal, PA-C.     Gwendolyn Glass is a 39 y.o. 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  She was diagnosed with alpha gal in August 2021.  We did get her level after the visit and it was only 0.65.  She did not have any IgE to mammalian meats themselves on this panel.  She is from [place] but she has lived here for ten years. She joined a Facebook group. She has had GI distress for ten  years or so, no matter what she eats or drinks. She cannot eat for a few days before she goes out anywhere. She cannot eat if she is planning to go outside of the home at all. Even water goes right through. She has found some waters that she is OK with.   She is doing dairy at this point. She did have some medications changed from gel capsules. This took some fighting to get this changed. She seems to be doing better, but she is still having issues. She is clueless and hungry right now. She is now on a low histamine diet as well because of all of her reactions. She has started going through various things and has been getting rid of "high histamine" foods since August 2021.  She is getting sick from [place]. She does fine with chicken. She is living on peanut butter and bananas. She loves seafood. She got rid of bread because her doctor told her to get rid of these foods. But she made crab cakes and this made her very sick. No she is concerned that she has developed some other weird allergy. She does have a GI appointment on November 4th with Gwendolyn Glass.  She does have some environmental allergies. She reports that when she pets her cat and rubs her face, her eyes are stuffy and her eyes swell up. She was on Allegra daily but she was reporting fatigue with this.   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 She has never undergone testing for this at all.   Otherwise, there is no history of other atopic diseases, including asthma, drug allergies, stinging insect allergies, eczema, urticaria or contact dermatitis. There is no significant infectious history. Vaccinations are up to date.    Past Medical History: Patient Active Problem List   Diagnosis Date Noted  . S/P hysterectomy 09/16/2018  . Cellulitis of right upper extremity   . Ovarian mass, right 03/01/2016  . RLQ abdominal pain 02/29/2016  . Nausea with vomiting 02/29/2016  . Diarrhea 02/29/2016  . C. difficile diarrhea  02/29/2016  . C. difficile colitis 02/29/2016  . Morbid obesity ([place]) 02/28/2016  . Tobacco abuse 02/28/2016  . [place]ggs recluse spider bite 02/27/2016  . Ectopic pregnancy, tubal 09/07/2014    Medication List:  Allergies as of 09/27/2020      Reactions   Cinnamon Swelling, Other (See Comments)   REACTION: throat swells   Bee Venom Swelling, Other (See Comments)   REACTION: All over body swelling   Vicodin [hydrocodone-acetaminophen] Nausea And Vomiting   Beef-derived Products    Dilaudid [hydromorphone Hcl] Hives   Pork-derived Products    Toradol [ketorolac Tromethamine]    Hives, and chest pain      Medication List       Accurate as of September 27, 2020 11:59 PM. If  you have any questions, ask your nurse or doctor.        acetaminophen 500 MG tablet Commonly known as: TYLENOL Take 500 mg by mouth as needed.   ALPRAZolam 1 MG tablet Commonly known as: XANAX Take 1 tablet by mouth 2 (two) times daily.   buPROPion 150 MG 24 hr tablet Commonly known as: WELLBUTRIN XL Take 150 mg by mouth daily.   cholestyramine light 4 GM/DOSE powder Commonly known as: PREVALITE Take 4 g by mouth 2 (two) times daily with a meal.   dicyclomine 20 MG tablet Commonly known as: BENTYL Take 1 tablet (20 mg total) by mouth 4 (four) times daily -  before meals and at bedtime.   diphenhydrAMINE 25 mg capsule Commonly known as: BENADRYL Take 25 mg by mouth every 6 (six) hours as needed.   EPIPEN IJ Inject as directed. As needed   levothyroxine 100 MCG tablet Commonly known as: SYNTHROID Take 100 mcg by mouth daily before breakfast.   losartan 25 MG tablet Commonly known as: COZAAR Take 25 mg by mouth daily.   multivitamin with minerals tablet Take 2 tablets by mouth daily.   omeprazole 40 MG capsule Commonly known as: PRILOSEC Take 40 mg by mouth daily.   promethazine 25 MG tablet Commonly known as: PHENERGAN Take 25 mg by mouth every 8 (eight) hours as needed for nausea or  vomiting.   silver sulfADIAZINE 1 % cream Commonly known as: Silvadene Apply to area TID       Birth History: non-contributory  Developmental History: non-contributory  Past Surgical History: Past Surgical History:  Procedure Laterality Date  . ABDOMINAL HYSTERECTOMY N/A 09/16/2018   Procedure: HYSTERECTOMY ABDOMINAL;  Surgeon: Florian Buff, MD;  Location: [facility];  Service: Gynecology;  Laterality: N/A;  . APPENDECTOMY    . CESAREAN SECTION    . CHOLECYSTECTOMY    . CHOLECYSTECTOMY  07/28/2012   Procedure: LAPAROSCOPIC CHOLECYSTECTOMY;  Surgeon: Donato Heinz, MD;  Location: [facility];  Service: General;  Laterality: N/A;  . LAPAROSCOPY N/A 09/07/2014   Procedure: LAPAROSCOPY OPERATIVE;  Surgeon: Woodroe Mode, MD;  Location: [facility];  Service: Gynecology;  Laterality: N/A;  . removal of lt fallopian tube    . UNILATERAL SALPINGECTOMY Right 09/07/2014   Procedure: UNILATERAL SALPINGECTOMY;  Surgeon: Woodroe Mode, MD;  Location: [facility];  Service: Gynecology;  Laterality: Right;  . WOUND DEBRIDEMENT Right 02/28/2016   Procedure: DEBRIDEMENT OF RIGHT SHOULDER SPIDER BITE;  Surgeon: Aviva Signs, MD;  Location: [facility];  Service: General;  Laterality: Right;     Family History: No family history on file.   Social History: Chantal lives at home with her family.  She lives in a house.  There is wood and tile in the main living areas and carpeting in the bedroom.  She has electric heating and window units for cooling.  There are cats and dogs inside of the home.  There are no dust mite covers on the bedding.  She is exposed smoke inside of the house, but not the car.  She does not smoke herself.  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  She is not exposed to fumes, chemicals, or dust at her workplace, but she is in her hobbies.  She does not have a HEPA filter.  She does not live near an interstate or industrial area.   Review of Systems  Constitutional: Negative.   Negative for chills, fever, malaise/fatigue and  weight loss.  HENT: Positive for congestion. Negative for ear discharge, ear pain and sinus pain.   Eyes: Negative for pain, discharge and redness.  Respiratory: Negative for cough, sputum production, shortness of breath and wheezing.   Cardiovascular: Negative.  Negative for chest pain and palpitations.  Gastrointestinal: Positive for abdominal pain and diarrhea. Negative for constipation, heartburn, nausea and vomiting.  Skin: Negative.  Negative for itching and rash.  Neurological: Negative for dizziness and headaches.  Endo/Heme/Allergies: Positive for environmental allergies. Does not bruise/bleed easily.       Positive for possible food allergies.       Objective:   Blood pressure 120/90, pulse (!) 101, temperature 98.7 F (37.1 C), resp. rate 18, height 5\' 8"  (1.727 m), weight 253 lb 3.2 oz (114.9 kg), last menstrual period 04/11/2017, SpO2 96 %. Body mass index is 38.5 kg/m.   Physical Exam:   Physical Exam Constitutional:      Appearance: She is well-developed. She is obese.  HENT:     Head: Normocephalic and atraumatic.     Right Ear: Tympanic membrane, ear canal and external ear normal. No drainage, swelling or tenderness. Tympanic membrane is not injected, scarred, erythematous, retracted or bulging.     Left Ear: Tympanic membrane, ear canal and external ear normal. No drainage, swelling or tenderness. Tympanic membrane is not injected, scarred, erythematous, retracted or bulging.     Nose: No nasal deformity, septal deviation, mucosal edema or rhinorrhea.     Right Turbinates: Enlarged and swollen.     Left Turbinates: Enlarged and swollen.     Right Sinus: No maxillary sinus tenderness or frontal sinus tenderness.     Left Sinus: No maxillary sinus tenderness or frontal sinus tenderness.     Comments: No polyps appreciated.     Mouth/Throat:     Mouth: Mucous membranes are not pale and not dry.     Pharynx: Uvula  midline.  Eyes:     General: Allergic shiner present.        Right eye: No discharge.        Left eye: No discharge.     Conjunctiva/sclera: Conjunctivae normal.     Right eye: Right conjunctiva is not injected. No chemosis.    Left eye: Left conjunctiva is not injected. No chemosis.    Pupils: Pupils are equal, round, and reactive to light.  Cardiovascular:     Rate and Rhythm: Normal rate and regular rhythm.     Heart sounds: Normal heart sounds.  Pulmonary:     Effort: Pulmonary effort is normal. No tachypnea, accessory muscle usage or respiratory distress.     Breath sounds: Normal breath sounds. No wheezing, rhonchi or rales.     Comments: Moving air well in all lung fields.  Chest:     Chest wall: No tenderness.  Abdominal:     Tenderness: There is no abdominal tenderness. There is no guarding or rebound.  Lymphadenopathy:     Head:     Right side of head: No submandibular, tonsillar or occipital adenopathy.     Left side of head: No submandibular, tonsillar or occipital adenopathy.     Cervical: No cervical adenopathy.  Skin:    Coloration: Skin is not pale.     Findings: No abrasion, erythema, petechiae or rash. Rash is not papular, urticarial or vesicular.     Comments: No eczematous lesions appreciated.   Neurological:     Mental Status: She is alert.  Diagnostic studies:   Allergy Studies:     Airborne Adult Perc - 09/27/20 1444    Time Antigen Placed 1444    Allergen Manufacturer Lavella Hammock    Location Back    Number of Test 59    Panel 1 Select    1. Control-Buffer 50% Glycerol Negative    2. Control-Histamine 1 mg/ml Negative    3. Albumin saline 3+    4. [place] Negative    5. [place] 2+    6. [place] 2+    7. [place] Blue 2+    8. Meadow Fescue 3+    9. Perennial Rye 3+    10. Sweet Vernal 3+    11. Timothy 3+    12. Cocklebur Negative    13. Burweed Marshelder Negative    14. Ragweed, short 3+    15. Ragweed, Giant 2+    16. Plantain,  English  Negative    17. Lamb's Quarters Negative    18. Sheep Sorrell Negative    19. Rough Pigweed Negative    20. Marsh Elder, Rough Negative    21. Mugwort, Common Negative    22. Ash mix Negative    23. Birch mix Negative    24. Beech American Negative    25. Box, Elder Negative    26. Cedar, red Negative    27. Cottonwood, [place] Negative    28. Elm mix Negative    29. Hickory Negative    30. Maple mix Negative    31. Oak, [place] mix Negative    32. Pecan Pollen Negative    33. Pine mix Negative    34. Sycamore Eastern Negative    35. [place], Black Pollen Negative    36. Alternaria alternata Negative    37. Cladosporium Herbarum Negative    38. Aspergillus mix Negative    39. Penicillium mix Negative    40. Bipolaris sorokiniana (Helminthosporium) Negative    41. Drechslera spicifera (Curvularia) Negative    42. Mucor plumbeus Negative    43. Fusarium moniliforme Negative    44. Aureobasidium pullulans (pullulara) Negative    45. Rhizopus oryzae Negative    46. Botrytis cinera Negative    47. Epicoccum nigrum Negative    48. Phoma betae Negative    49. Candida Albicans Negative    50. Trichophyton mentagrophytes Negative    51. Mite, D Farinae  5,000 AU/ml 3+    52. Mite, D Pteronyssinus  5,000 AU/ml 3+    53. Cat Hair 10,000 BAU/ml 3+    54.  Dog Epithelia 2+    55. Mixed Feathers Negative    56. Horse Epithelia Negative    57. Cockroach, German Negative    58. Mouse Negative    59. Tobacco Leaf Negative          Intradermal - 09/27/20 1611    Time Antigen Placed 1611    Allergen Manufacturer Lavella Hammock    Location Arm    Number of Test 8    Intradermal Select    Control Negative    Weed mix Negative    Tree mix Negative    Mold 1 Negative    Mold 2 Negative    Mold 3 2+    Mold 4 3+    Cockroach Negative          Food Adult Perc - 09/27/20 1400    Time Antigen Placed 1445    Allergen Manufacturer Lavella Hammock    Location Back    Number  of allergen test 72      Control-buffer 50% Glycerol Negative    Control-Histamine 1 mg/ml 3+    1. Peanut Negative    2. Soybean --   2x3   3. Wheat Negative    4. Sesame Negative    5. Milk, cow Negative    6. Egg White, Chicken Negative    7. Casein Negative    8. Shellfish Mix Negative    9. Fish Mix Negative    10. Cashew Negative    11. Pecan Food Negative    12. Stanley Negative    13. Almond Negative    14. Hazelnut Negative    15. [place] nut Negative    16. Coconut Negative    17. Pistachio Negative    18. Catfish Negative    19. Bass Negative    20. Trout Negative    21. Tuna Negative    22. Salmon Negative    23. Flounder Negative    24. Codfish Negative    25. Shrimp Negative    26. Crab Negative    27. Lobster Negative    28. Oyster Negative    29. Scallops Negative    30. Barley Negative    31. Oat  Negative    32. Rye  Negative    33. Hops Negative    34. Rice Negative    35. Cottonseed Negative    36. Saccharomyces Cerevisiae  Negative    37. Pork Negative    38. [place] Meat Negative    39. Chicken Meat Negative    40. Beef Negative    41. Lamb Negative    42. Tomato Negative    43. White Potato Negative    44. Sweet Potato Negative    45. Pea, Green/English Negative    46. Navy Bean Negative    47. Mushrooms Negative    48. Avocado Negative    49. Onion --   2x4   50. Cabbage Negative    51. Carrots Negative    52. Celery Negative    53. Corn Negative    54. Cucumber Negative    55. Grape (White seedless) Negative    56. Orange  Negative    57. Banana Negative    58. Apple Negative    59. Peach Negative    60. Strawberry Negative    61. Cantaloupe Negative    62. Watermelon Negative    63. Pineapple Negative    64. Chocolate/Cacao bean Negative    65. Karaya Gum Negative    66. Acacia (Arabic Gum) Negative    67. Cinnamon Negative    68. Nutmeg Negative    69. Ginger Negative    70. Garlic Negative    71. Pepper, black Negative    72. Mustard  Negative           Allergy testing results were read and interpreted by myself, documented by clinical staff.         Salvatore Marvel, MD [facility]

## 2020-09-29 LAB — ALLERGEN STINGING INSECT PANEL

## 2020-09-29 LAB — ALLERGEN PROFILE, SHELLFISH

## 2020-09-29 LAB — PROTEIN ELECTROPHORESIS, SERUM: Albumin ELP: 3.3 g/dL (ref 2.9–4.4)

## 2020-09-30 ENCOUNTER — Encounter: Payer: Self-pay | Admitting: Allergy & Immunology

## 2020-10-01 LAB — PROTEIN ELECTROPHORESIS, SERUM
Alpha 1: 0.2 g/dL (ref 0.0–0.4)
Gamma Globulin: 0.9 g/dL (ref 0.4–1.8)
Globulin, Total: 2.7 g/dL (ref 2.2–3.9)
Total Protein: 6 g/dL (ref 6.0–8.5)

## 2020-10-01 LAB — METANEPHRINES, PLASMA

## 2020-10-01 LAB — ALLERGEN STINGING INSECT PANEL: Paper Wasp IgE: <0.1 kU/L

## 2020-10-03 LAB — ALLERGEN PROFILE, SHELLFISH
Clam IgE: <0.1 kU/L
F080-IgE Lobster: <0.1 kU/L
F290-IgE Oyster: <0.1 kU/L
Scallop IgE: <0.1 kU/L

## 2020-10-03 LAB — PROTEIN ELECTROPHORESIS, SERUM
A/G Ratio: 1.2 (ref 0.7–1.7)
Alpha 2: 0.6 g/dL (ref 0.4–1.0)
Beta: 1 g/dL (ref 0.7–1.3)

## 2020-10-03 LAB — ALLERGEN STINGING INSECT PANEL
Hornet, White Face, IgE: <0.1 kU/L
Hornet, Yellow, IgE: <0.1 kU/L

## 2020-10-03 LAB — ALLERGEN, CINNAMON, RF220: Allergen Cinnamon IgE: <0.1 kU/L

## 2020-10-03 LAB — ALLERGEN SOYBEAN: Soybean IgE: <0.1 kU/L

## 2020-10-03 LAB — METANEPHRINES, PLASMA: Metanephrine, Free: 20.5 pg/mL (ref 0.0–88.0)

## 2020-10-03 LAB — TRYPTASE: Tryptase: 4.6 ug/L (ref 2.2–13.2)

## 2020-10-03 LAB — ALLERGEN, MUSTARD, F89: F089-IgE Mustard: <0.1 kU/L

## 2020-10-05 ENCOUNTER — Other Ambulatory Visit: Payer: Self-pay

## 2020-10-05 ENCOUNTER — Encounter: Payer: Self-pay | Admitting: Gastroenterology

## 2020-10-05 ENCOUNTER — Ambulatory Visit (AMBULATORY_SURGERY_CENTER): Payer: BC Managed Care – PPO | Admitting: Gastroenterology

## 2020-10-05 VITALS — BP 140/88 | HR 84 | Temp 98.9°F | Resp 16 | Ht 68.0 in | Wt 260.0 lb

## 2020-10-05 DIAGNOSIS — K621 Rectal polyp: Secondary | ICD-10-CM | POA: Diagnosis not present

## 2020-10-05 DIAGNOSIS — K219 Gastro-esophageal reflux disease without esophagitis: Secondary | ICD-10-CM

## 2020-10-05 DIAGNOSIS — K529 Noninfective gastroenteritis and colitis, unspecified: Secondary | ICD-10-CM

## 2020-10-05 DIAGNOSIS — K2970 Gastritis, unspecified, without bleeding: Secondary | ICD-10-CM

## 2020-10-05 DIAGNOSIS — R1084 Generalized abdominal pain: Secondary | ICD-10-CM

## 2020-10-05 DIAGNOSIS — K6289 Other specified diseases of anus and rectum: Secondary | ICD-10-CM

## 2020-10-05 DIAGNOSIS — D128 Benign neoplasm of rectum: Secondary | ICD-10-CM

## 2020-10-05 DIAGNOSIS — K259 Gastric ulcer, unspecified as acute or chronic, without hemorrhage or perforation: Secondary | ICD-10-CM | POA: Diagnosis not present

## 2020-10-05 DIAGNOSIS — K642 Third degree hemorrhoids: Secondary | ICD-10-CM | POA: Diagnosis not present

## 2020-10-05 DIAGNOSIS — R112 Nausea with vomiting, unspecified: Secondary | ICD-10-CM

## 2020-10-05 DIAGNOSIS — R197 Diarrhea, unspecified: Secondary | ICD-10-CM

## 2020-10-05 MED ORDER — SODIUM CHLORIDE 0.9 % IV SOLN: 500.0000 mL | Freq: Once | INTRAVENOUS | Status: DC

## 2020-10-05 NOTE — Discharge Instructions (Signed)
Resume previous medications. Increase protonix to twice daily for the next 2-3 months. Start fiberCon 1-2 tablets daily. (over the counter) Handouts on findings given to patient. Await pathology for final recommendations. Likely will need repeat colonoscopy in 1 year. WILL REQUIRE 2 DAY PREP IN THE FUTURE.  YOU HAD AN ENDOSCOPIC PROCEDURE TODAY AT [facility]:   Refer to the procedure report that was given to you for any specific questions about what was found during the examination.  If the procedure report does not answer your questions, please call your gastroenterologist to clarify.  If you requested that your care partner not be given the details of your procedure findings, then the procedure report has been included in a sealed envelope for you to review at your convenience later.  YOU SHOULD EXPECT: Some feelings of bloating in the abdomen. Passage of more gas than usual.  Walking can help get rid of the air that was put into your GI tract during the procedure and reduce the bloating. If you had a lower endoscopy (such as a colonoscopy or flexible sigmoidoscopy) you may notice spotting of blood in your stool or on the toilet paper. If you underwent a bowel prep for your procedure, you may not have a normal bowel movement for a few days.  Please Note:  You might notice some irritation and congestion in your nose or some drainage.  This is from the oxygen used during your procedure.  There is no need for concern and it should clear up in a day or so.  SYMPTOMS TO REPORT IMMEDIATELY:   Following lower endoscopy (colonoscopy or flexible sigmoidoscopy):  Excessive amounts of blood in the stool  Significant tenderness or worsening of abdominal pains  Swelling of the abdomen that is new, acute  Fever of 100F or higher   Following upper endoscopy (EGD)  Vomiting of blood or coffee ground material  New chest pain or pain under the shoulder blades  Painful or persistently  difficult swallowing  New shortness of breath  Fever of 100F or higher  Black, tarry-looking stools  For urgent or emergent issues, a gastroenterologist can be reached at any hour by calling [phone]. Do not use MyChart messaging for urgent concerns.    DIET:  We do recommend a small meal at first, but then you may proceed to your regular diet.  Drink plenty of fluids but you should avoid alcoholic beverages for 24 hours.  ACTIVITY:  You should plan to take it easy for the rest of today and you should NOT DRIVE or use heavy machinery until tomorrow (because of the sedation medicines used during the test).    FOLLOW UP: Our staff will call the number listed on your records 48-72 hours following your procedure to check on you and address any questions or concerns that you may have regarding the information given to you following your procedure. If we do not reach you, we will leave a message.  We will attempt to reach you two times.  During this call, we will ask if you have developed any symptoms of COVID 19. If you develop any symptoms (ie: fever, flu-like symptoms, shortness of breath, cough etc.) before then, please call [phone].  If you test positive for Covid 19 in the 2 weeks post procedure, please call and report this information to [place].    If any biopsies were taken you will be contacted by phone or by letter within the next 1-3 weeks.  Please call  us at [phone] if you have not heard about the biopsies in 3 weeks.    SIGNATURES/CONFIDENTIALITY: You and/or your care partner have signed paperwork which will be entered into your electronic medical record.  These signatures attest to the fact that that the information above on your After Visit Summary has been reviewed and is understood.  Full responsibility of the confidentiality of this discharge information lies with you and/or your care-partner.

## 2020-10-05 NOTE — Progress Notes (Signed)
A and O x3. Report to RN. Tolerated MAC anesthesia well.Teeth unchanged after procedure.

## 2020-10-05 NOTE — Op Note (Signed)
[facility] Patient Name: Gwendolyn Glass Procedure Date: 10/05/2020 1:28 PM MRN: [id number] Endoscopist: Justice Britain , MD Age: 39 Referring MD:  Date of Birth: 1980/12/27 Gender: Female Account #: [id number] Procedure:                Colonoscopy Indications:              Generalized abdominal pain, Chronic diarrhea,                            Abnormal CT of the GI tract - reportedly in outside                            facility Medicines:                Monitored Anesthesia Care Procedure:                Pre-Anesthesia Assessment:                           - Prior to the procedure, a History and Physical                            was performed, and patient medications and                            allergies were reviewed. The patient's tolerance of                            previous anesthesia was also reviewed. The risks                            and benefits of the procedure and the sedation                            options and risks were discussed with the patient.                            All questions were answered, and informed consent                            was obtained. Prior Anticoagulants: The patient has                            taken no previous anticoagulant or antiplatelet                            agents. ASA Grade Assessment: II - A patient with                            mild systemic disease. After reviewing the risks                            and benefits, the patient was deemed in  satisfactory condition to undergo the procedure.                           After obtaining informed consent, the colonoscope                            was passed under direct vision. Throughout the                            procedure, the patient's blood pressure, pulse, and                            oxygen saturations were monitored continuously. The                            Colonoscope was introduced through the anus and                             advanced to the 10 cm into the ileum. The                            colonoscopy was performed without difficulty. The                            patient tolerated the procedure. The quality of the                            bowel preparation was fair. The terminal ileum,                            ileocecal valve, appendiceal orifice, and rectum                            were photographed. Scope In: 1:46:42 PM Scope Out: 2:06:43 PM Scope Withdrawal Time: 0 hours 14 minutes 55 seconds  Total Procedure Duration: 0 hours 20 minutes 1 second  Findings:                 Skin tags were found on perianal exam.                           The digital rectal exam findings include                            hemorrhoids. Pertinent negatives include no                            palpable rectal lesions.                           Extensive amounts of semi-liquid semi-solid stool                            was found in the entire colon, interfering with  visualization. Lavage of the area was performed                            using copious amounts, resulting in clearance with                            fair visualization though inadequate for true                            screening purposes.                           The terminal ileum and ileocecal valve appeared                            normal. Biopsies were taken with a cold forceps for                            histology to rule out chronic ileitis.                           Normal mucosa was found in the entire colon when                            able to be visualized. Biopsies were taken with a                            cold forceps for histology to rule out chronic                            colitis.                           Two sessile polyps were found in the rectum. The                            polyps were 2 to 5 mm in size. These polyps were                            removed  with a cold snare. Resection and retrieval                            were complete.                           Anal papilla(e) were hypertrophied.                           Non-bleeding non-thrombosed external and internal                            hemorrhoids were found during retroflexion, during                            perianal exam  and during digital exam. The                            hemorrhoids were Grade III (internal hemorrhoids                            that prolapse but require manual reduction). Complications:            No immediate complications. Estimated Blood Loss:     Estimated blood loss was minimal. Impression:               - Preparation of the colon was fair after copious                            lavage but not adequate for appropriate screening.                           - Perianal skin tags found on perianal exam.                            Hemorrhoids found on digital rectal exam.                           - Stool in the entire examined colon - Lavaged.                           - The examined portion of the ileum was normal.                            Biopsied.                           - Normal mucosa in the entire examined colon when                            able to be visualized. Biopsied.                           - Two 2 to 5 mm polyps in the rectum, removed with                            a cold snare. Resected and retrieved.                           - Anal papilla(e) were hypertrophied. Non-bleeding                            non-thrombosed external and internal hemorrhoids. Recommendation:           - The patient will be observed post-procedure,                            until all discharge criteria are met.                           -  Discharge patient to home.                           - Patient has a contact number available for                            emergencies. The signs and symptoms of potential                            delayed  complications were discussed with the                            patient. Return to normal activities tomorrow.                            Written discharge instructions were provided to the                            patient.                           - High fiber diet.                           - Use FiberCon 1-2 tablets PO daily.                           - Continue present medications.                           - Await pathology results.                           - Repeat colonoscopy within 1 year because the                            bowel preparation was suboptimal (2-day preparation                            necessary) this is critical especially if polyps                            removed return adenomatous.                           - Follow up in clinic to be determined after                            pathology returns.                           - The findings and recommendations were discussed                            with the patient. Justice Britain, MD 10/05/2020 2:26:36 PM

## 2020-10-05 NOTE — Progress Notes (Signed)
Lidocaine buffwer robinol antisialogogue

## 2020-10-05 NOTE — Progress Notes (Signed)
VS by CW. ?

## 2020-10-05 NOTE — Op Note (Signed)
[name] Patient Name: Gwendolyn Glass Procedure Date: 10/05/2020 1:28 PM MRN: [id number] Endoscopist: Justice Britain , MD Age: 39 Referring MD:  Date of Birth: 1981-02-13 Gender: Female Account #: [id number] Procedure:                Upper GI endoscopy Indications:              Generalized abdominal pain, Diarrhea Medicines:                Monitored Anesthesia Care Procedure:                Pre-Anesthesia Assessment:                           - Prior to the procedure, a History and Physical                            was performed, and patient medications and                            allergies were reviewed. The patient's tolerance of                            previous anesthesia was also reviewed. The risks                            and benefits of the procedure and the sedation                            options and risks were discussed with the patient.                            All questions were answered, and informed consent                            was obtained. Prior Anticoagulants: The patient has                            taken no previous anticoagulant or antiplatelet                            agents. ASA Grade Assessment: II - A patient with                            mild systemic disease. After reviewing the risks                            and benefits, the patient was deemed in                            satisfactory condition to undergo the procedure.                           After obtaining informed consent, the endoscope was  passed under direct vision. Throughout the                            procedure, the patient's blood pressure, pulse, and                            oxygen saturations were monitored continuously. The                            Endoscope was introduced through the mouth, and                            advanced to the second part of duodenum. The upper                            GI endoscopy  was accomplished without difficulty.                            The patient tolerated the procedure. Scope In: Scope Out: Findings:                 No gross lesions were noted in the proximal                            esophagus and in the mid esophagus. Biopsies were                            taken with a cold forceps for histology to rule out                            EoE/LoE.                           One tongue of salmon-colored mucosa was present                            from 38 to 40 cm. No other visible abnormalities                            were present. Biopsies were taken with a cold                            forceps for histology to rule out Barrett's.                           Localized moderate inflammation characterized by                            erosions, erythema and granularity was found in the                            gastric antrum and in the prepyloric region of the  stomach.                           There was evidence of a lighter coloration of the                            antrum/prepylorus tissue compared to the body.                           No other gross lesions were noted in the entire                            examined stomach. Biopsies were taken with a cold                            forceps for histology and Helicobacter pylori                            testing.                           No gross lesions were noted in the duodenal bulb,                            in the first portion of the duodenum and in the                            second portion of the duodenum. Biopsies were taken                            with a cold forceps for histology to rule out                            Celiac/Enteropathy. Complications:            No immediate complications. Estimated Blood Loss:     Estimated blood loss was minimal. Impression:               - No gross lesions in esophagus proximally.                             Biopsied. Salmon-colored mucosa suspicious for                            short-segment Barrett's esophagus distally.                            Biopsied to rule in/out.                           - Gastritis in antrum/prepylorus. Discoloration                            noted in the body to antrum transition. No other  gross lesions in the stomach. Biopsied.                           - No gross lesions in the duodenal bulb, in the                            first portion of the duodenum and in the second                            portion of the duodenum. Biopsied. Recommendation:           - Proceed to scheduled colonoscopy.                           - Increase Omeprazole to 40 mg twice daily for next                            2-3 months to aid in healing of distal gastritis.                           - Await pathology results.                           - The findings and recommendations were discussed                            with the patient. Justice Britain, MD 10/05/2020 2:17:39 PM

## 2020-10-09 ENCOUNTER — Telehealth: Payer: Self-pay

## 2020-10-09 NOTE — Telephone Encounter (Signed)
Attempted to reach patient for post-procedure f/u call. No answer. Left message that we will make another attempt to reach her later today and for her to please not hesitate to call us if she has any questions/concerns regarding her care. 

## 2020-10-09 NOTE — Telephone Encounter (Signed)
Covid-19 screening questions   Do you now or have you had a fever in the last 14 days? No.  Do you have any respiratory symptoms of shortness of breath or cough now or in the last 14 days? No.  Do you have any family members or close contacts with diagnosed or suspected Covid-19 in the past 14 days? No. Have you been tested for Covid-19 and found to be positive? No.       Follow up Call-  Call back number 10/05/2020  Post procedure Call Back phone  # [phone]  Permission to leave phone message Yes  Some recent data might be hidden     Patient questions:  Do you have a fever, pain , or abdominal swelling? No. Pain Score  0 *  Have you tolerated food without any problems? Yes.    Have you been able to return to your normal activities? Yes.    Do you have any questions about your discharge instructions: Diet   No. Medications  No. Follow up visit  No.  Do you have questions or concerns about your Care? No. Pt. Reports she had a sore throat the day after her procedure, but it is now resolved.  Told pt.That that was not unusual, and to call back if she has any further questions or concerns. Actions: * If pain score is 4 or above: No action needed, pain <4.

## 2020-10-10 LAB — SPECIMEN STATUS REPORT

## 2020-10-11 LAB — ALPHA-GAL PANEL

## 2020-10-12 ENCOUNTER — Encounter: Payer: Self-pay | Admitting: Gastroenterology

## 2020-10-12 ENCOUNTER — Telehealth: Payer: Self-pay | Admitting: *Deleted

## 2020-10-12 NOTE — Progress Notes (Signed)
Unfortunately, there was not enough blood to have the alpha gal panel added onto her previous labs. Therefore, I reordered it. Can someone contact the patient to let her know that she needs to have her blood redrawn?  Salvatore Marvel, MD [facility]

## 2020-10-12 NOTE — Addendum Note (Signed)
Addended by: Valentina Shaggy on: 10/12/2020 05:28 PM   Modules accepted: Orders

## 2020-10-12 NOTE — Telephone Encounter (Signed)
Called patient and advised. Patient verbalized understanding and will try to come by tomorrow to pick up the requisition in the [place] office. Requisition has been printed and has been placed in an envelope to be sent to [place] office.

## 2020-10-12 NOTE — Telephone Encounter (Signed)
-----   Message from Valentina Shaggy, MD sent at 10/12/2020  5:28 PM EST ----- Unfortunately, there was not enough blood to have the alpha gal panel added onto her previous labs. Therefore, I reordered it. Can someone contact the patient to let her know that she needs to have her blood redrawn?

## 2020-10-16 ENCOUNTER — Telehealth: Payer: Self-pay | Admitting: Gastroenterology

## 2020-10-16 NOTE — Telephone Encounter (Signed)
The pt has been advised of the letters and was also resent both to her my chart. She is going to call back to make a follow up appt

## 2020-10-16 NOTE — Telephone Encounter (Signed)
Pt is requesting a call back from a nurse regarding her results from her EGD/Colon.

## 2020-10-17 NOTE — Progress Notes (Signed)
We received her records from her PCP.  They were notable for a negative Celiac panel, normal CBC, normal ESR, and normal ferritin. Magnesium was slightly low and vitamin D was slightly low. AEC was only 100. Results scanned into the system.  Salvatore Marvel, MD [facility]

## 2020-11-09 LAB — ALPHA-GAL PANEL
Alpha Gal IgE*: 0.24 kU/L — ABNORMAL HIGH (ref <0.10)
Beef (Bos spp) IgE: <0.1 kU/L (ref <0.35)
Class Interpretation: 0
Class Interpretation: 0
Class Interpretation: 0
Lamb/Mutton (Ovis spp) IgE: <0.1 kU/L (ref <0.35)
Pork (Sus spp) IgE: <0.1 kU/L (ref <0.35)

## 2020-11-10 ENCOUNTER — Encounter (HOSPITAL_COMMUNITY): Payer: Self-pay

## 2020-11-10 ENCOUNTER — Other Ambulatory Visit: Payer: Self-pay

## 2020-11-10 ENCOUNTER — Emergency Department (HOSPITAL_COMMUNITY)
Admission: EM | Admit: 2020-11-10 | Discharge: 2020-11-10 | Disposition: A | Discharge status: Home / Self Care | Payer: BC Managed Care – PPO | Attending: Emergency Medicine | Admitting: Emergency Medicine

## 2020-11-10 DIAGNOSIS — R002 Palpitations: Secondary | ICD-10-CM | POA: Diagnosis not present

## 2020-11-10 DIAGNOSIS — Z87891 Personal history of nicotine dependence: Secondary | ICD-10-CM | POA: Insufficient documentation

## 2020-11-10 DIAGNOSIS — I10 Essential (primary) hypertension: Secondary | ICD-10-CM | POA: Insufficient documentation

## 2020-11-10 DIAGNOSIS — R197 Diarrhea, unspecified: Secondary | ICD-10-CM

## 2020-11-10 DIAGNOSIS — Z79899 Other long term (current) drug therapy: Secondary | ICD-10-CM | POA: Insufficient documentation

## 2020-11-10 DIAGNOSIS — E876 Hypokalemia: Secondary | ICD-10-CM

## 2020-11-10 DIAGNOSIS — R519 Headache, unspecified: Secondary | ICD-10-CM | POA: Insufficient documentation

## 2020-11-10 LAB — COMPREHENSIVE METABOLIC PANEL
ALT: 24 U/L (ref 0–44)
AST: 15 U/L (ref 15–41)
Albumin: 4.1 g/dL (ref 3.5–5.0)
Alkaline Phosphatase: 76 U/L (ref 38–126)
Anion gap: 11 (ref 5–15)
BUN: 9 mg/dL (ref 6–20)
CO2: 26 mmol/L (ref 22–32)
Calcium: 9.1 mg/dL (ref 8.9–10.3)
Chloride: 102 mmol/L (ref 98–111)
Creatinine, Ser: 0.84 mg/dL (ref 0.44–1.00)
GFR, Estimated: >60 mL/min (ref >60)
Glucose, Bld: 98 mg/dL (ref 70–99)
Potassium: 2.9 mmol/L — ABNORMAL LOW (ref 3.5–5.1)
Sodium: 139 mmol/L (ref 135–145)
Total Bilirubin: 0.6 mg/dL (ref 0.3–1.2)
Total Protein: 7.2 g/dL (ref 6.5–8.1)

## 2020-11-10 LAB — CBC WITH DIFFERENTIAL/PLATELET
Abs Immature Granulocytes: 0.04 10*3/uL (ref 0.00–0.07)
Basophils Absolute: 0 10*3/uL (ref 0.0–0.1)
Basophils Relative: 0 %
Eosinophils Absolute: 0.1 10*3/uL (ref 0.0–0.5)
Eosinophils Relative: 1 %
HCT: 41.4 % (ref 36.0–46.0)
Hemoglobin: 13.8 g/dL (ref 12.0–15.0)
Immature Granulocytes: 1 %
Lymphocytes Relative: 31 %
Lymphs Abs: 2.6 10*3/uL (ref 0.7–4.0)
MCH: 28.4 pg (ref 26.0–34.0)
MCHC: 33.3 g/dL (ref 30.0–36.0)
MCV: 85.2 fL (ref 80.0–100.0)
Monocytes Absolute: 0.5 10*3/uL (ref 0.1–1.0)
Monocytes Relative: 6 %
Neutro Abs: 5.4 10*3/uL (ref 1.7–7.7)
Neutrophils Relative %: 61 %
Platelets: 211 10*3/uL (ref 150–400)
RBC: 4.86 MIL/uL (ref 3.87–5.11)
RDW: 12.5 % (ref 11.5–15.5)
WBC: 8.6 10*3/uL (ref 4.0–10.5)
nRBC: 0 % (ref 0.0–0.2)

## 2020-11-10 LAB — URINALYSIS, ROUTINE W REFLEX MICROSCOPIC
Bilirubin Urine: NEGATIVE
Glucose, UA: NEGATIVE mg/dL
Hgb urine dipstick: NEGATIVE
Ketones, ur: NEGATIVE mg/dL
Nitrite: NEGATIVE
Protein, ur: 30 mg/dL — AB
Specific Gravity, Urine: 1.021 (ref 1.005–1.030)
WBC, UA: >50 WBC/hpf — ABNORMAL HIGH (ref 0–5)
pH: 5 (ref 5.0–8.0)

## 2020-11-10 LAB — BASIC METABOLIC PANEL
Anion gap: 8 (ref 5–15)
BUN: 7 mg/dL (ref 6–20)
CO2: 27 mmol/L (ref 22–32)
Calcium: 8.7 mg/dL — ABNORMAL LOW (ref 8.9–10.3)
Chloride: 104 mmol/L (ref 98–111)
Creatinine, Ser: 0.84 mg/dL (ref 0.44–1.00)
GFR, Estimated: >60 mL/min (ref >60)
Glucose, Bld: 88 mg/dL (ref 70–99)
Potassium: 3.2 mmol/L — ABNORMAL LOW (ref 3.5–5.1)
Sodium: 139 mmol/L (ref 135–145)

## 2020-11-10 LAB — MAGNESIUM: Magnesium: 1.6 mg/dL — ABNORMAL LOW (ref 1.7–2.4)

## 2020-11-10 LAB — LIPASE, BLOOD: Lipase: 22 U/L (ref 11–51)

## 2020-11-10 LAB — TROPONIN I (HIGH SENSITIVITY)
Troponin I (High Sensitivity): <2 ng/L (ref <18)
Troponin I (High Sensitivity): <2 ng/L (ref <18)

## 2020-11-10 MED ORDER — POTASSIUM CHLORIDE CRYS ER 20 MEQ PO TBCR
40.0000 meq | EXTENDED_RELEASE_TABLET | Freq: Once | ORAL | Status: AC
  Administered 2020-11-10: 40 meq via ORAL
  Filled 2020-11-10: qty 2

## 2020-11-10 MED ORDER — POTASSIUM CHLORIDE 10 MEQ/100ML IV SOLN
10.0000 meq | Freq: Once | INTRAVENOUS | Status: AC
  Administered 2020-11-10: 10 meq via INTRAVENOUS
  Filled 2020-11-10: qty 100

## 2020-11-10 MED ORDER — LACTATED RINGERS IV BOLUS
1000.0000 mL | Freq: Once | INTRAVENOUS | Status: AC
  Administered 2020-11-10: 1000 mL via INTRAVENOUS

## 2020-11-10 MED ORDER — POTASSIUM CHLORIDE CRYS ER 20 MEQ PO TBCR: 20.0000 meq | EXTENDED_RELEASE_TABLET | Freq: Two times a day (BID) | ORAL | 0 refills | Status: DC

## 2020-11-10 NOTE — ED Provider Notes (Signed)
[facility] Provider Note   CSN: [id number] Arrival date & time: 11/10/20  0341     History Chief Complaint  Patient presents with  . Chest Pain    Gwendolyn Glass is a 39 y.o. female.  Patient presents to the emergency department for evaluation of multiple problems.  Patient reports that she has been experiencing severe spasms of all the muscles in her body for the last 2 weeks.  She also has been experiencing headaches.  Patient reports that she has a history of alpha gal allergy and attributes this to her symptoms.  Patient reports that she has chronic diarrhea that has been ongoing since 2013.  She has had electrolyte disturbances including hypomagnesemia, but reports that she cannot take any supplements secondary to allergy.        Past Medical History:  Diagnosis Date  . Allergy   . Allergy to alpha-gal   . Anxiety   . [place] recluse spider bite 02/27/2016  . C. difficile diarrhea 02/29/2016  . Depression   . Hypertension   . Miscarriage    pt. states shes  spotting  and wearing a tampon  . Ovarian mass, right 03/01/2016   ? complex cyst  . Pregnant     Patient Active Problem List   Diagnosis Date Noted  . S/P hysterectomy 09/16/2018  . Cellulitis of right upper extremity   . Ovarian mass, right 03/01/2016  . RLQ abdominal pain 02/29/2016  . Nausea with vomiting 02/29/2016  . Diarrhea 02/29/2016  . C. difficile diarrhea 02/29/2016  . C. difficile colitis 02/29/2016  . Morbid obesity ([place]) 02/28/2016  . Tobacco abuse 02/28/2016  . [place] recluse spider bite 02/27/2016  . Ectopic pregnancy, tubal 09/07/2014    Past Surgical History:  Procedure Laterality Date  . ABDOMINAL HYSTERECTOMY N/A 09/16/2018   Procedure: HYSTERECTOMY ABDOMINAL;  Surgeon: Florian Buff, MD;  Location: [facility];  Service: Gynecology;  Laterality: N/A;  . APPENDECTOMY    . CESAREAN SECTION    . CHOLECYSTECTOMY    . CHOLECYSTECTOMY  07/28/2012   Procedure:  LAPAROSCOPIC CHOLECYSTECTOMY;  Surgeon: Donato Heinz, MD;  Location: [facility];  Service: General;  Laterality: N/A;  . LAPAROSCOPY N/A 09/07/2014   Procedure: LAPAROSCOPY OPERATIVE;  Surgeon: Woodroe Mode, MD;  Location: [facility];  Service: Gynecology;  Laterality: N/A;  . removal of lt fallopian tube    . UNILATERAL SALPINGECTOMY Right 09/07/2014   Procedure: UNILATERAL SALPINGECTOMY;  Surgeon: Woodroe Mode, MD;  Location: [facility];  Service: Gynecology;  Laterality: Right;  . WOUND DEBRIDEMENT Right 02/28/2016   Procedure: DEBRIDEMENT OF RIGHT SHOULDER SPIDER BITE;  Surgeon: Aviva Signs, MD;  Location: [facility];  Service: General;  Laterality: Right;     OB History    Gravida  10   Para  2   Term  2   Preterm      AB  7   Living        SAB  6   IAB      Ectopic  1   Multiple      Live Births              Family History  Problem Relation Age of Onset  . Colon cancer Neg Hx   . Esophageal cancer Neg Hx   . Rectal cancer Neg Hx   . Stomach cancer Neg Hx     Social History   Tobacco Use  . Smoking status: Former Smoker  Packs/day: 0.50    Years: 1.00    Pack years: 0.50    Types: Cigarettes  . Smokeless tobacco: Never Used  Vaping Use  . Vaping Use: Never used  Substance Use Topics  . Alcohol use: Not Currently    Comment: occ.  . Drug use: No    Home Medications Prior to Admission medications   Medication Sig Start Date End Date Taking? Authorizing Provider  acetaminophen (TYLENOL) 500 MG tablet Take 500 mg by mouth as needed. Patient not taking: Reported on 09/27/2020    [provider]  ALPRAZolam Duanne Moron) 1 MG tablet Take 1 tablet by mouth 2 (two) times daily.  02/17/16   [provider]  buPROPion (WELLBUTRIN XL) 150 MG 24 hr tablet Take 150 mg by mouth daily.    [provider]  cholestyramine light (PREVALITE) 4 GM/DOSE powder Take 4 g by mouth 2 (two) times daily with a meal.    [provider]  dicyclomine  (BENTYL) 20 MG tablet Take 1 tablet (20 mg total) by mouth 4 (four) times daily -  before meals and at bedtime. Patient not taking: Reported on 10/05/2020 08/29/20   Levin Erp, PA  diphenhydrAMINE (BENADRYL) 25 mg capsule Take 25 mg by mouth every 6 (six) hours as needed.    [provider]  EPINEPHrine (EPIPEN IJ) Inject as directed. As needed    [provider]  Levothyroxine Sodium (TIROSINT) 100 MCG CAPS Take 100 mcg by mouth daily before breakfast.    [provider]  losartan (COZAAR) 25 MG tablet Take 25 mg by mouth daily.    [provider]  Multiple Vitamins-Minerals (MULTIVITAMIN WITH MINERALS) tablet Take 2 tablets by mouth daily.    [provider]  omeprazole (PRILOSEC) 40 MG capsule Take 40 mg by mouth daily.    [provider]  promethazine (PHENERGAN) 25 MG tablet Take 25 mg by mouth every 8 (eight) hours as needed for nausea or vomiting.    [provider]    Allergies    Cinnamon, Gwendolyn venom, Vicodin [hydrocodone-acetaminophen], Beef-derived products, Dilaudid [hydromorphone hcl], Pork-derived products, and Toradol [ketorolac tromethamine]  Review of Systems   Review of Systems  Gastrointestinal: Positive for diarrhea.  Musculoskeletal: Positive for myalgias.  Neurological: Positive for headaches.  All other systems reviewed and are negative.   Physical Exam Updated Vital Signs BP (!) 144/104 (BP Location: Right Arm)   Pulse (!) 114   Temp 98.6 F (37 C) (Oral)   Resp 18   Ht 5\' 8"  (1.727 m)   Wt 110.2 kg   LMP 04/11/2017 (Exact Date) Comment: has had continual periods  SpO2 98%   BMI 36.95 kg/m   Physical Exam Vitals and nursing note reviewed.  Constitutional:      General: She is not in acute distress.    Appearance: Normal appearance. She is well-developed and well-nourished.  HENT:     Head: Normocephalic and atraumatic.     Right Ear: Hearing normal.     Left Ear: Hearing  normal.     Nose: Nose normal.     Mouth/Throat:     Mouth: Oropharynx is clear and moist and mucous membranes are normal.  Eyes:     Extraocular Movements: EOM normal.     Conjunctiva/sclera: Conjunctivae normal.     Pupils: Pupils are equal, round, and reactive to light.  Cardiovascular:     Rate and Rhythm: Regular rhythm.     Heart sounds: S1 normal  and S2 normal. No murmur heard. No friction rub. No gallop.   Pulmonary:     Effort: Pulmonary effort is normal. No respiratory distress.     Breath sounds: Normal breath sounds.  Chest:     Chest wall: No tenderness.  Abdominal:     General: Bowel sounds are normal.     Palpations: Abdomen is soft. There is no hepatosplenomegaly.     Tenderness: There is no abdominal tenderness. There is no guarding or rebound. Negative signs include Murphy's sign and McBurney's sign.     Hernia: No hernia is present.  Musculoskeletal:        General: Normal range of motion.     Cervical back: Normal range of motion and neck supple.  Skin:    General: Skin is warm, dry and intact.     Findings: No rash.     Nails: There is no cyanosis.  Neurological:     Mental Status: She is alert and oriented to person, place, and time.     GCS: GCS eye subscore is 4. GCS verbal subscore is 5. GCS motor subscore is 6.     Cranial Nerves: No cranial nerve deficit.     Sensory: No sensory deficit.     Coordination: Coordination normal.     Deep Tendon Reflexes: Strength normal.  Psychiatric:        Mood and Affect: Mood and affect normal.        Speech: Speech normal.        Behavior: Behavior normal.        Thought Content: Thought content normal.     ED Results / Procedures / Treatments   Labs (all labs ordered are listed, but only abnormal results are displayed) Labs Reviewed  C DIFFICILE QUICK SCREEN W PCR REFLEX  GASTROINTESTINAL PANEL BY PCR, STOOL (REPLACES STOOL CULTURE)  CBC WITH DIFFERENTIAL/PLATELET  COMPREHENSIVE METABOLIC PANEL   LIPASE, BLOOD  URINALYSIS, ROUTINE W REFLEX MICROSCOPIC  MAGNESIUM  TROPONIN I (HIGH SENSITIVITY)    EKG EKG Interpretation  Date/Time:  Friday November 10 2020 03:52:20 EST Ventricular Rate:  113 PR Interval:  130 QRS Duration: 84 QT Interval:  336 QTC Calculation: 460 R Axis:   64 Text Interpretation: Sinus tachycardia ST & T wave abnormality, consider inferior ischemia Abnormal ECG No previous tracing Confirmed by Orpah Greek [phone]) on 11/10/2020 4:47:40 AM   Radiology No results found.  Procedures Procedures (including critical care time)  Medications Ordered in ED Medications  lactated ringers bolus 1,000 mL (has no administration in time range)    ED Course  I have reviewed the triage vital signs and the nursing notes.  Pertinent labs & imaging results that were available during my care of the patient were reviewed by me and considered in my medical decision making (see chart for details).    MDM Rules/Calculators/A&P                          Patient appears anxious.  She has multiple complaints, some are somewhat chronic.  It appears that she has had problems with chronic diarrhea for many years.  This has been attributed to multiple factors in the past.  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  She has recently established with a gastroenterologist and had a colonoscopy.  She was told that she had some polyps but would need a repeat  colonoscopy with a better bowel prep in a couple of months.  It sounds like she will have an upper endoscopy at that time as well.  It is unlikely we will discover a cause for the patient's symptoms today but will check labs.  She has been experiencing heart palpitations, feeling like she has been skipping heartbeats.  Will monitor.  EKG unremarkable.  Will check electrolytes to see if she has hypomagnesemia, hypokalemia or hypocalcemia that could  explain some of her current symptoms.  Will hydrate.  Check a C. difficile and gastrointestinal panel if she can provide stool sample.  Will sign to oncoming ER physician to follow-up results.  Final Clinical Impression(s) / ED Diagnoses Final diagnoses:  Palpitations  Diarrhea, unspecified type    Rx / DC Orders ED Discharge Orders    None       Nijae Doyel, Gwenyth Allegra, MD 11/10/20 0630

## 2020-11-10 NOTE — ED Provider Notes (Signed)
Patient turned over to me by overnight physician.  Labs were still pending at 7 this morning.  Patient's had 2 troponins both normal.  Patient's main concern was a low potassium at 2.9.  Magnesium was low a little bit the patient supposedly has an allergy to magnesium supplements.  So held off on getting that.  Patient did receive 10 mEq of potassium IV x2.  And also 40 mg of potassium orally.  We will recheck electrolytes.  If potassium showing signs of improvement patient can be discharged home.  There has been no vomiting or diarrhea here.  CRITICAL CARE Performed by: Fredia Sorrow Total critical care time: 35 minutes Critical care time was exclusive of separately billable procedures and treating other patients. Critical care was necessary to treat or prevent imminent or life-threatening deterioration. Critical care was time spent personally by me on the following activities: development of treatment plan with patient and/or surrogate as well as nursing, discussions with consultants, evaluation of patient's response to treatment, examination of patient, obtaining history from patient or surrogate, ordering and performing treatments and interventions, ordering and review of laboratory studies, ordering and review of radiographic studies, pulse oximetry and re-evaluation of patient's condition.      Fredia Sorrow, MD 11/10/20 [phone]

## 2020-11-10 NOTE — ED Triage Notes (Signed)
Pt reports chest pain that started yesterday evening. Pt also reports "body spasms" x 2 weeks. Pt also c/o migraine x 2 weeks. Pt c/o diarrhea for a couple of years.

## 2020-11-10 NOTE — ED Provider Notes (Signed)
Care transferred to me.  Potassium has finally resulted and is 3.2 which is an improvement.  No vomiting here.  Has chronic diarrhea.  Follow-up with PCP as scheduled in 3 days.  Discharged with [facility].   Sherwood Gambler, MD 11/10/20 1730

## 2020-11-13 LAB — URINE CULTURE: Culture: >=100000 — AB

## 2020-11-14 ENCOUNTER — Telehealth: Payer: Self-pay | Admitting: Emergency Medicine

## 2020-11-14 NOTE — Progress Notes (Signed)
ED Antimicrobial Stewardship Positive Culture Follow Up   Gwendolyn Glass is an 39 y.o. female who presented to [facility] on 11/10/2020 with a chief complaint of muscle spasms, headaches, heart palpitations, and diarrhea.  Chief Complaint  Patient presents with  . Chest Pain    Recent Results (from the past 720 hour(s))  Urine Culture     Status: Abnormal   Collection Time: 11/10/20 12:33 PM   Specimen: Urine, Clean Catch  Result Value Ref Range Status   Specimen Description   Final    URINE, CLEAN CATCH Performed at [facility], [address], [place], [place] [address]    Special Requests   Final    NONE Performed at [facility], [address], [place], [place] [address]    Culture >=100,000 COLONIES/mL STAPHYLOCOCCUS AUREUS (A)  Final   Report Status 11/13/2020 FINAL  Final   Organism ID, Bacteria STAPHYLOCOCCUS AUREUS (A)  Final      Susceptibility   Staphylococcus aureus - MIC*    CIPROFLOXACIN <=0.5 SENSITIVE Sensitive     GENTAMICIN <=0.5 SENSITIVE Sensitive     NITROFURANTOIN <=16 SENSITIVE Sensitive     OXACILLIN 0.5 SENSITIVE Sensitive     TETRACYCLINE <=1 SENSITIVE Sensitive     VANCOMYCIN <=0.5 SENSITIVE Sensitive     TRIMETH/SULFA <=10 SENSITIVE Sensitive     CLINDAMYCIN <=0.25 SENSITIVE Sensitive     RIFAMPIN <=0.5 SENSITIVE Sensitive     Inducible Clindamycin NEGATIVE Sensitive     * >=100,000 COLONIES/mL STAPHYLOCOCCUS AUREUS    Discharged without antibiotics. Assess for urinary symptoms, if absent no further treatment needed. If present start keflex 500mg  by mouth every 6 hours for 7 days.   ED Provider: Raelyn Ensign, PA-C   Henri Medal 11/14/2020, 1:48 PM Clinical Pharmacist Monday - Friday phone -  [phone] Saturday - Sunday phone - [phone]

## 2020-11-14 NOTE — Telephone Encounter (Signed)
Post ED Visit - Positive Culture Follow-up: Successful Patient Follow-Up  Culture assessed and recommendations reviewed by:  []  Elenor Quinones, Pharm.D. []  Heide Guile, Pharm.D., BCPS AQ-ID []  Parks Neptune, Pharm.D., BCPS []  Alycia Rossetti, Pharm.D., BCPS []  [place], Pharm.D., BCPS, AAHIVP []  Legrand Como, Pharm.D., BCPS, AAHIVP []  Salome Arnt, PharmD, BCPS []  Johnnette Gourd, PharmD, BCPS []  Hughes Better, PharmD, BCPS []  Leeroy Cha, PharmD  Positive urine culture  []  Patient discharged without antimicrobial prescription and treatment is now indicated []  Organism is resistant to prescribed ED discharge antimicrobial []  Patient with positive blood cultures  Changes discussed with ED provider: Silverio [facility] New antibiotic prescription symptom check, with some burning, start Keflex 500mg  po every 6 hours x 7 days Called to [place]     Hazle Nordmann 11/14/2020, 2:00 PM

## 2020-11-29 ENCOUNTER — Ambulatory Visit: Payer: BC Managed Care – PPO | Admitting: Family

## 2021-03-07 ENCOUNTER — Encounter: Payer: Self-pay | Admitting: Endocrinology

## 2021-03-07 ENCOUNTER — Ambulatory Visit: Payer: BC Managed Care – PPO | Admitting: Endocrinology

## 2021-03-07 ENCOUNTER — Other Ambulatory Visit: Payer: Self-pay

## 2021-03-07 DIAGNOSIS — E039 Hypothyroidism, unspecified: Secondary | ICD-10-CM | POA: Diagnosis not present

## 2021-03-07 DIAGNOSIS — E876 Hypokalemia: Secondary | ICD-10-CM | POA: Diagnosis not present

## 2021-03-07 LAB — BASIC METABOLIC PANEL
BUN: 10 mg/dL (ref 6–23)
CO2: 29 mEq/L (ref 19–32)
Calcium: 9.7 mg/dL (ref 8.4–10.5)
Chloride: 106 mEq/L (ref 96–112)
Creatinine, Ser: 0.88 mg/dL (ref 0.40–1.20)
GFR: 83.89 mL/min (ref >60.00)
Glucose, Bld: 103 mg/dL — ABNORMAL HIGH (ref 70–99)
Potassium: 3.9 mEq/L (ref 3.5–5.1)
Sodium: 141 mEq/L (ref 135–145)

## 2021-03-07 LAB — CORTISOL: Cortisol, Plasma: 8.8 ug/dL

## 2021-03-07 LAB — TSH: TSH: 3.56 u[IU]/mL (ref 0.35–4.50)

## 2021-03-07 LAB — T4, FREE: Free T4: 0.9 ng/dL (ref 0.60–1.60)

## 2021-03-07 LAB — MAGNESIUM: Magnesium: 1.6 mg/dL (ref 1.5–2.5)

## 2021-03-07 LAB — PHOSPHORUS: Phosphorus: 3 mg/dL (ref 2.3–4.6)

## 2021-03-07 NOTE — Patient Instructions (Signed)
Blood tests are requested for you today.  We'll let you know about the results.  

## 2021-03-07 NOTE — Progress Notes (Signed)
Subjective:    Patient ID: Gwendolyn Glass, female    DOB: 10/09/1981, 40 y.o.   MRN: [id number]  HPI Pt is referred by Dr Nadara Mustard, for hypokalemia.  Pt was first noted to have hypokalemia in 2021.  She has no h/o renal disease, diuretic rx, licorice, chemotx, special diet, steroids, adrenal disease, malabsorption, herbal supplements.  Pt says she is not at risk for pregnancy.  She takes KLOR, but no Mg++ supplement.   She has had hypothyroidism since 2019.  She has been on 175 mcg/d, x 1 month.  She has chronic diarrhea and abd cramps (GI w/u was neg).  However, pt reports postprandial diarrhea of undigested food.     Past Medical History:  Diagnosis Date  . Allergy   . Allergy to alpha-gal   . Anxiety   . [place] recluse spider bite 02/27/2016  . C. difficile diarrhea 02/29/2016  . Depression   . Hypertension   . Miscarriage    pt. states shes  spotting  and wearing a tampon  . Ovarian mass, right 03/01/2016   ? complex cyst  . Pregnant     Past Surgical History:  Procedure Laterality Date  . ABDOMINAL HYSTERECTOMY N/A 09/16/2018   Procedure: HYSTERECTOMY ABDOMINAL;  Surgeon: Florian Buff, MD;  Location: [facility];  Service: Gynecology;  Laterality: N/A;  . APPENDECTOMY    . CESAREAN SECTION    . CHOLECYSTECTOMY    . CHOLECYSTECTOMY  07/28/2012   Procedure: LAPAROSCOPIC CHOLECYSTECTOMY;  Surgeon: Donato Heinz, MD;  Location: [facility];  Service: General;  Laterality: N/A;  . LAPAROSCOPY N/A 09/07/2014   Procedure: LAPAROSCOPY OPERATIVE;  Surgeon: Woodroe Mode, MD;  Location: [facility];  Service: Gynecology;  Laterality: N/A;  . removal of lt fallopian tube    . UNILATERAL SALPINGECTOMY Right 09/07/2014   Procedure: UNILATERAL SALPINGECTOMY;  Surgeon: Woodroe Mode, MD;  Location: [facility];  Service: Gynecology;  Laterality: Right;  . WOUND DEBRIDEMENT Right 02/28/2016   Procedure: DEBRIDEMENT OF RIGHT SHOULDER SPIDER BITE;  Surgeon: Aviva Signs, MD;  Location: [facility];  Service:  General;  Laterality: Right;    Social History   Socioeconomic History  . Marital status: Married    Spouse name: Not on file  . Number of children: Not on file  . Years of education: Not on file  . Highest education level: Not on file  Occupational History  . Not on file  Tobacco Use  . Smoking status: Former Smoker    Packs/day: 0.50    Years: 1.00    Pack years: 0.50    Types: Cigarettes  . Smokeless tobacco: Never Used  Vaping Use  . Vaping Use: Never used  Substance and Sexual Activity  . Alcohol use: Not Currently    Comment: occ.  . Drug use: No  . Sexual activity: Not Currently    Birth control/protection: Surgical    Comment: hyst  Other Topics Concern  . Not on file  Social History Narrative  . Not on file   Social Determinants of Health   Financial Resource Strain: Not on file  Food Insecurity: Not on file  Transportation Needs: Not on file  Physical Activity: Not on file  Stress: Not on file  Social Connections: Not on file  Intimate Partner Violence: Not on file    Current Outpatient Medications on File Prior to Visit  Medication Sig Dispense Refill  . acetaminophen (TYLENOL) 500 MG tablet Take 500 mg by mouth as  needed.    . ALPRAZolam (XANAX) 1 MG tablet Take 1 tablet by mouth 2 (two) times daily.  0  . buPROPion (WELLBUTRIN XL) 150 MG 24 hr tablet Take 150 mg by mouth daily.    . cholestyramine light (PREVALITE) 4 GM/DOSE powder Take 4 g by mouth 2 (two) times daily with a meal.    . dicyclomine (BENTYL) 20 MG tablet Take 1 tablet (20 mg total) by mouth 4 (four) times daily -  before meals and at bedtime. 120 tablet 2  . diphenhydrAMINE (BENADRYL) 25 mg capsule Take 25 mg by mouth every 6 (six) hours as needed for allergies.    Marland Kitchen EPINEPHrine (EPIPEN IJ) Inject as directed. As needed    . Levothyroxine Sodium 100 MCG CAPS Take 100 mcg by mouth daily before breakfast.    . losartan (COZAAR) 25 MG tablet Take 25 mg by mouth daily.    Marland Kitchen omeprazole  (PRILOSEC) 40 MG capsule Take 40 mg by mouth in the morning and at bedtime.    . promethazine (PHENERGAN) 25 MG tablet Take 25 mg by mouth every 8 (eight) hours as needed for nausea or vomiting.    Marland Kitchen VITAMIN D, CHOLECALCIFEROL, PO Take 1 tablet by mouth daily.    . potassium chloride SA (KLOR-CON) 20 MEQ tablet Take 1 tablet (20 mEq total) by mouth 2 (two) times daily for 5 days. 10 tablet 0   Current Facility-Administered Medications on File Prior to Visit  Medication Dose Route Frequency Provider Last Rate Last Admin  . 0.9 %  sodium chloride infusion  500 mL Intravenous Once Mansouraty, Telford Nab., MD        Allergies  Allergen Reactions  . Cinnamon Swelling and Other (See Comments)    REACTION: throat swells  . Bee Venom Swelling and Other (See Comments)    REACTION: All over body swelling  . Vicodin [Hydrocodone-Acetaminophen] Nausea And Vomiting  . Beef-Derived Products   . Dilaudid [Hydromorphone Hcl] Hives  . Pork-Derived Products   . Toradol [Ketorolac Tromethamine]     Hives, and chest pain    Family History  Problem Relation Age of Onset  . Colon cancer Neg Hx   . Esophageal cancer Neg Hx   . Rectal cancer Neg Hx   . Stomach cancer Neg Hx   . Adrenal disorder Neg Hx     BP 134/86 (BP Location: Right Arm, Patient Position: Sitting, Cuff Size: Large)   Pulse (!) 119   Ht 5\' 8"  (1.727 m)   Wt 233 lb 12.8 oz (106.1 kg)   LMP 04/11/2017 (Exact Date) Comment: has had continual periods  SpO2 99%   BMI 35.55 kg/m       Review of Systems Pt denies n/v, palpitations, and polyuria.  She has lost 45 lbs x 4 mos.  She has heat intolerance, acral tingling, and sweating.       Objective:   Physical Exam VS: see vs page GEN: no distress HEAD: head: no deformity eyes: no periorbital swelling, no proptosis external nose and ears are normal NECK: supple, thyroid is not enlarged CHEST WALL: no deformity LUNGS: clear to auscultation CV: reg rate and rhythm, no  murmur.  MUSCULOSKELETAL: gait is normal and steady EXTEMITIES: no deformity.  no leg edema NEURO:  readily moves all 4's.  sensation is intact to touch on all 4's SKIN:  Normal texture and temperature.  No rash or suspicious lesion is visible.   NODES:  None palpable at the neck PSYCH:  alert, well-oriented.  Does not appear anxious nor depressed.  outside test results are reviewed: Mg++=1.5 TSH=38 25-OH Vit-D=24 K+=3.7 Creat=0.9 CT (2017): normal adrenals.    I have reviewed outside records, and summarized: Pt was noted to have low K+, and referred here.  She was rx'ed for leg pain with prednisone in 2021.      Assessment & Plan:  Hypokalemia, new to me, uncertain etiology and prognosis   Patient Instructions  Blood tests are requested for you today.  We'll let you know about the results.

## 2021-03-15 LAB — ALDOSTERONE + RENIN ACTIVITY W/ RATIO
Aldosterone: <1 ng/dL
Renin Activity: 1.29 ng/mL/h (ref 0.25–5.82)

## 2021-05-24 ENCOUNTER — Other Ambulatory Visit: Payer: Self-pay

## 2021-05-24 ENCOUNTER — Telehealth: Payer: Self-pay | Admitting: Gastroenterology

## 2021-05-24 DIAGNOSIS — R197 Diarrhea, unspecified: Secondary | ICD-10-CM

## 2021-05-24 NOTE — Telephone Encounter (Signed)
Confirmed we have no Release of Information on patient's chart for anyone. Looks like patient was in the [facility] ED 2 weeks ago with chest pain and and a low Potassium, but nothing GI noted. I called the patient and left message for her to please call us back so we can get more information.

## 2021-05-24 NOTE — Telephone Encounter (Signed)
Lets have the patient bring in stool samples for the following: GI pathogen panel, C. difficile, ova and parasite, fecal leukocyte, fecal elastase The patient can initiate Lomotil 1 to 2 tablets daily in the interim if she has maximized Imodium (maximum Imodium would be 4 mg in the morning with 2 mg being given another 4 times for total of 12 mg total per day). Okay to go ahead and send a short prescription of Lomotil while she maximizes Imodium in the interim 30 tablets no refills. Thanks. GM

## 2021-05-24 NOTE — Telephone Encounter (Signed)
Patient called and states she has sever diarrhea and it has caused her Potassium to drop again. She had to go to the ED 2 weeks ago with chest pain caused by her low K+. The earliest appt we have even with any app. Is 06/21/21. She states that Imodium doesn't help anymore and Bentyl does not either. Wants to know if there is anything she can do to slow the diarrhea while waiting for an office visit. Please advise

## 2021-05-24 NOTE — Telephone Encounter (Signed)
Inbound call from patient returning nurse's call.

## 2021-05-24 NOTE — Telephone Encounter (Signed)
Order in [place] for stool tests. Called order into patient's pharmacy for Lomotil-1 to 2 tabs daily. Count-30/no refills. Called patient and gave instructions for taking. She will come into our lab tomorrow for stool samples.

## 2021-05-24 NOTE — Telephone Encounter (Signed)
Inbound call from patient's husband requesting info about patient's upcoming appt; knows it is in July but not sure when.  Since they are not on patient's DPR, was not able to give info out.  They stated patient has been getting worse and has been to ED.  Wants to know if patient can be seen asap.  Please advise.

## 2021-05-25 ENCOUNTER — Other Ambulatory Visit: Payer: BC Managed Care – PPO

## 2021-05-25 DIAGNOSIS — R197 Diarrhea, unspecified: Secondary | ICD-10-CM

## 2021-05-27 LAB — CLOSTRIDIUM DIFFICILE EIA: C difficile Toxins A+B, EIA: NEGATIVE

## 2021-05-28 LAB — GI PROFILE, STOOL, PCR

## 2021-05-31 LAB — FECAL LEUKOCYTES
Micro Number: 12047771
Result: NOT DETECTED
Specimen Quality: ADEQUATE

## 2021-05-31 LAB — PANCREATIC ELASTASE, FECAL: Pancreatic Elastase-1, Stool: >500 mcg/g

## 2021-05-31 LAB — OVA AND PARASITE EXAMINATION
CONCENTRATE RESULT:: NONE SEEN
MICRO NUMBER:: 12047772
SPECIMEN QUALITY:: ADEQUATE
TRICHROME RESULT:: NONE SEEN

## 2021-06-22 ENCOUNTER — Other Ambulatory Visit: Payer: BC Managed Care – PPO

## 2021-06-22 ENCOUNTER — Ambulatory Visit: Payer: BC Managed Care – PPO | Admitting: Gastroenterology

## 2021-06-22 ENCOUNTER — Encounter: Payer: Self-pay | Admitting: Gastroenterology

## 2021-06-22 ENCOUNTER — Telehealth: Payer: Self-pay | Admitting: Gastroenterology

## 2021-06-22 ENCOUNTER — Other Ambulatory Visit (INDEPENDENT_AMBULATORY_CARE_PROVIDER_SITE_OTHER): Payer: BC Managed Care – PPO

## 2021-06-22 VITALS — BP 110/72 | HR 96 | Ht 68.0 in | Wt 231.0 lb

## 2021-06-22 DIAGNOSIS — G8929 Other chronic pain: Secondary | ICD-10-CM | POA: Diagnosis not present

## 2021-06-22 DIAGNOSIS — R109 Unspecified abdominal pain: Secondary | ICD-10-CM

## 2021-06-22 DIAGNOSIS — K529 Noninfective gastroenteritis and colitis, unspecified: Secondary | ICD-10-CM

## 2021-06-22 DIAGNOSIS — Z9049 Acquired absence of other specified parts of digestive tract: Secondary | ICD-10-CM

## 2021-06-22 DIAGNOSIS — R634 Abnormal weight loss: Secondary | ICD-10-CM | POA: Diagnosis not present

## 2021-06-22 LAB — COMPREHENSIVE METABOLIC PANEL
ALT: 18 U/L (ref 0–35)
AST: 13 U/L (ref 0–37)
Albumin: 4.2 g/dL (ref 3.5–5.2)
Alkaline Phosphatase: 83 U/L (ref 39–117)
BUN: 10 mg/dL (ref 6–23)
CO2: 26 mEq/L (ref 19–32)
Calcium: 9 mg/dL (ref 8.4–10.5)
Chloride: 106 mEq/L (ref 96–112)
Creatinine, Ser: 0.86 mg/dL (ref 0.40–1.20)
GFR: 86.06 mL/min (ref >60.00)
Glucose, Bld: 85 mg/dL (ref 70–99)
Potassium: 4 mEq/L (ref 3.5–5.1)
Sodium: 140 mEq/L (ref 135–145)
Total Bilirubin: 0.3 mg/dL (ref 0.2–1.2)
Total Protein: 7 g/dL (ref 6.0–8.3)

## 2021-06-22 LAB — CBC
HCT: 40.8 % (ref 36.0–46.0)
Hemoglobin: 13.4 g/dL (ref 12.0–15.0)
MCHC: 32.9 g/dL (ref 30.0–36.0)
MCV: 83.8 fl (ref 78.0–100.0)
Platelets: 172 10*3/uL (ref 150.0–400.0)
RBC: 4.87 Mil/uL (ref 3.87–5.11)
RDW: 14.3 % (ref 11.5–15.5)
WBC: 8.9 10*3/uL (ref 4.0–10.5)

## 2021-06-22 LAB — TSH: TSH: 18.49 u[IU]/mL — ABNORMAL HIGH (ref 0.35–5.50)

## 2021-06-22 LAB — CORTISOL: Cortisol, Plasma: 8.3 ug/dL

## 2021-06-22 LAB — SEDIMENTATION RATE: Sed Rate: 16 mm/hr (ref 0–20)

## 2021-06-22 MED ORDER — CHOLESTYRAMINE 4 G PO PACK: 4.0000 g | PACK | Freq: Two times a day (BID) | ORAL | 6 refills | Status: DC

## 2021-06-22 NOTE — Patient Instructions (Signed)
We have sent the following medications to your pharmacy for you to pick up at your convenience: Cholestyramine: Take 1 packet by mouth twice daily. ( START Cholestyramine after trial of Creon).   Creon- Take 2 capsules by mouth with each meal and Take 1 capsule by mouth with each snack ( up 2 snacks per day) for 1 week.  (Samples given today.)  You have been scheduled for a CT scan of the abdomen and pelvis at [facility] ([address]---this is in the same building as [employer]).   You are scheduled on 06/27/21 at 1:30pm. You should arrive 15 minutes prior to your appointment time for registration. Please follow the written instructions below on the day of your exam:  WARNING: IF YOU ARE ALLERGIC TO IODINE/X-RAY DYE, PLEASE NOTIFY RADIOLOGY IMMEDIATELY AT [phone]! YOU WILL BE GIVEN A 13 HOUR PREMEDICATION PREP.  1) Do not eat or drink anything after 9:30am (4 hours prior to your test) 2) You have been given 2 bottles of oral contrast to drink. The solution may taste better if refrigerated, but do NOT add ice or any other liquid to this solution. Shake well before drinking.    Drink 1 bottle of contrast @ 11:30am (2 hours prior to your exam)  Drink 1 bottle of contrast @ 12:30pm (1 hour prior to your exam)  You may take any medications as prescribed with a small amount of water, if necessary. If you take any of the following medications: METFORMIN, GLUCOPHAGE, GLUCOVANCE, AVANDAMET, RIOMET, FORTAMET, [place] MET, JANUMET, GLUMETZA or METAGLIP, you MAY be asked to HOLD this medication 48 hours AFTER the exam.  The purpose of you drinking the oral contrast is to aid in the visualization of your intestinal tract. The contrast solution may cause some diarrhea. Depending on your individual set of symptoms, you may also receive an intravenous injection of x-ray contrast/dye. Plan on being at [facility] for 30 minutes or longer, depending on the type of exam you  are having performed.  This test typically takes 30-45 minutes to complete.  If you have any questions regarding your exam or if you need to reschedule, you may call the CT department at [phone] between the hours of 8:00 am and 5:00 pm, Monday-Friday.   Your provider has requested that you go to the basement level for lab work before leaving today. Press "B" on the elevator. The lab is located at the first door on the left as you exit the elevator.  You will also need to have STOOL STUDIES done at [facility]- Please take orders over to [facility]:  Address: [address], [place], [place] [address] Hours:  Closes for lunch 12:00pm-1:00pm daily.  Due to recent changes in healthcare laws, you may see the results of your imaging and laboratory studies on MyChart before your provider has had a chance to review them.  We understand that in some cases there may be results that are confusing or concerning to you. Not all laboratory results come back in the same time frame and the provider may be waiting for multiple results in order to interpret others.  Please give [place] 48 hours in order for your provider to thoroughly review all the results before contacting the office for clarification of your results.    If you are age 38 or younger, your body mass index should be between 19-25. Your Body mass index is 35.12 kg/m. If this is out of the aformentioned range listed,  please consider follow up with your Primary Care Provider.   __________________________________________________________  The [facility] providers would like to encourage you to use [facility] to communicate with providers for non-urgent requests or questions.  Due to long hold times on the telephone, sending your provider a message by [facility] [place] [facility] may be a faster and more efficient way to get a response.  Please allow 48 business hours for a response.  Please remember that this is for non-urgent requests.  Thank you for choosing me and  [facility].  Dr. Rush Landmark

## 2021-06-22 NOTE — Telephone Encounter (Signed)
Inbound call from patient spouse, Marden Noble. States patient was told to give 72 hour stool samples but was not even enough sample bottles for the patient diarrhea. States it would only last 1 day. Best contact number [phone]

## 2021-06-22 NOTE — Progress Notes (Signed)
s  [place] VISIT   Primary Care Provider Lanelle Bal, PA-C [address] [place] [address] [phone]  Patient Profile: Gwendolyn Glass is a 40 y.o. female with a pmh significant for anxiety, MDD, alpha galactosidase deficiency, hypertension, prior C. difficile infection, ovarian cyst, chronic diarrhea, status postcholecystectomy.  The patient presents to the [facility] for an evaluation and management of problem(s) noted below:  Problem List 1. Chronic diarrhea   2. Chronic abdominal pain   3. Unintentional weight loss   4. History of cholecystectomy     History of Present Illness Please see initial consultation note by [facility] for full details of HPI.  Interval History The patient returns for follow-up.  She recently reached out with concern for progressive symptoms and underwent an infectious work-up that was unremarkable.  Patient is accompanied today by her husband.  The patient states that she is in the worst state of her life.  She describes a nearly 70 pound weight loss in the last year (documentation in the chart only suggests that her weight has decreased by 10 pounds in 10 months).  Patient states that she is having diarrhea between 5 and 10 times per day.  No blood in her stools.  Mucus is noted at times.  She does not feel complete evacuation at times.  She has not had incontinence.  This prevents her from going out safely because of the concern of having a bowel movement relatively quickly.  She does have bowel movements relatively quickly after eating.  She has eaten corn at lunchtime and within an hour is passing corn in her bowels per her report and her husband's report.  She cannot live this way per her discussion and she feels at times that this is worsening her symptoms of anxiety/depression.  She has seen a psychologist previously.  She has seen a psychiatrist in the past.  She is not sure that her medications are  working at this time but she is interested in potentially being reevaluated by psychiatry if we have a recommendation.  Patient has not had cross-sectional imaging in quite a few years.  Patient denies significant nonsteroidals or BC/Goody powders.  The patient states that she stopped taking cholestyramine, although it may have helped slightly, due to her reported abnormal LFTs occurring as well as transition and worsening of her thyroid function.  She states that she did not tolerate WelChol (colestipol.  Imodium was not effective up to 10 mg daily.  Lomotil on a single dose caused her to have some symptoms of lightheadedness and unawareness so she did not want to continue that medication.  She is certain that something is wrong and that we need to better understand this as soon as possible otherwise she needs to come into the hospital.  She does have evidence of some recent issues of hypokalemia and she has recently been initiated on magnesium by her PCP.  GI Review of Systems Positive as above including pyrosis at times, bloating, pain, early satiety Negative for dysphagia, odynophagia  Review of Systems General: Denies fevers/chills HEENT: Denies oral lesions Cardiovascular: Denies chest pain/palpitations Pulmonary: Denies shortness of breath Gastroenterological: See HPI Genitourinary: Denies darkened urine Hematological: Denies easy bruising/bleeding Endocrine: Denies temperature intolerance Dermatological: Denies jaundice Psychological: Mood is anxious and worried   Medications Current Outpatient Medications  Medication Sig Dispense Refill   acetaminophen (TYLENOL) 500 MG tablet Take 500 mg by mouth as needed.     ALPRAZolam Duanne Moron) 1  MG tablet Take 1 tablet by mouth 2 (two) times daily.  0   buPROPion (WELLBUTRIN XL) 150 MG 24 hr tablet Take 150 mg by mouth daily.     cholestyramine (QUESTRAN) 4 g packet Take 1 packet (4 g total) by mouth 2 (two) times daily. 60 each 6    diphenhydrAMINE (BENADRYL) 25 mg capsule Take 25 mg by mouth every 6 (six) hours as needed for allergies.     EPINEPHrine (EPIPEN IJ) Inject as directed. As needed     losartan (COZAAR) 25 MG tablet Take 25 mg by mouth daily.     Magnesium 200 MG TABS Take 1 tablet by mouth daily.     omeprazole (PRILOSEC) 40 MG capsule Take 40 mg by mouth in the morning and at bedtime.     potassium chloride (KLOR-CON) 20 MEQ packet Take 2 packets by mouth daily.     promethazine (PHENERGAN) 25 MG tablet Take 25 mg by mouth every 8 (eight) hours as needed for nausea or vomiting.     TIROSINT 175 MCG CAPS Take 1 capsule by mouth daily.     Current Facility-Administered Medications  Medication Dose Route Frequency Provider Last Rate Last Admin   0.9 %  sodium chloride infusion  500 mL Intravenous Once Mansouraty, Telford Nab., MD        Allergies Allergies  Allergen Reactions   Cinnamon Swelling and Other (See Comments)    REACTION: throat swells   Bee Venom Swelling and Other (See Comments)    REACTION: All over body swelling   Vicodin [Hydrocodone-Acetaminophen] Nausea And Vomiting   Beef-Derived Products    Dilaudid [Hydromorphone Hcl] Hives   Pork-Derived Products    Toradol [Ketorolac Tromethamine]     Hives, and chest pain    Histories Past Medical History:  Diagnosis Date   Allergy    Allergy to alpha-gal    Anxiety    Fiorenza recluse spider bite 02/27/2016   C. difficile diarrhea 02/29/2016   Depression    Hypertension    Miscarriage    pt. states shes  spotting  and wearing a tampon   Ovarian mass, right 03/01/2016   ? complex cyst   Pregnant    Past Surgical History:  Procedure Laterality Date   ABDOMINAL HYSTERECTOMY N/A 09/16/2018   Procedure: HYSTERECTOMY ABDOMINAL;  Surgeon: Florian Buff, MD;  Location: [facility];  Service: Gynecology;  Laterality: N/A;   APPENDECTOMY     CESAREAN SECTION     CHOLECYSTECTOMY     CHOLECYSTECTOMY  07/28/2012   Procedure: LAPAROSCOPIC  CHOLECYSTECTOMY;  Surgeon: Donato Heinz, MD;  Location: [facility];  Service: General;  Laterality: N/A;   LAPAROSCOPY N/A 09/07/2014   Procedure: LAPAROSCOPY OPERATIVE;  Surgeon: Woodroe Mode, MD;  Location: [facility];  Service: Gynecology;  Laterality: N/A;   removal of lt fallopian tube     UNILATERAL SALPINGECTOMY Right 09/07/2014   Procedure: UNILATERAL SALPINGECTOMY;  Surgeon: Woodroe Mode, MD;  Location: [facility];  Service: Gynecology;  Laterality: Right;   WOUND DEBRIDEMENT Right 02/28/2016   Procedure: DEBRIDEMENT OF RIGHT SHOULDER SPIDER BITE;  Surgeon: Aviva Signs, MD;  Location: [facility];  Service: General;  Laterality: Right;   Social History   Socioeconomic History   Marital status: Married    Spouse name: Not on file   Number of children: Not on file   Years of education: Not on file   Highest education level: Not on file  Occupational History   Not  on file  Tobacco Use   Smoking status: Former    Packs/day: 0.50    Years: 1.00    Pack years: 0.50    Types: Cigarettes   Smokeless tobacco: Never  Vaping Use   Vaping Use: Never used  Substance and Sexual Activity   Alcohol use: Not Currently    Comment: occ.   Drug use: No   Sexual activity: Not Currently    Birth control/protection: Surgical    Comment: hyst  Other Topics Concern   Not on file  Social History Narrative   Not on file   Social Determinants of Health   Financial Resource Strain: Not on file  Food Insecurity: Not on file  Transportation Needs: Not on file  Physical Activity: Not on file  Stress: Not on file  Social Connections: Not on file  Intimate Partner Violence: Not on file   Family History  Problem Relation Age of Onset   Colon cancer Neg Hx    Esophageal cancer Neg Hx    Rectal cancer Neg Hx    Stomach cancer Neg Hx    Adrenal disorder Neg Hx    Liver disease Neg Hx    Inflammatory bowel disease Neg Hx    Pancreatic cancer Neg Hx    I have reviewed her medical, social, and  family history in detail and updated the electronic medical record as necessary.    PHYSICAL EXAMINATION  BP 110/72   Pulse 96   Ht _0  (1.727 m)   Wt 231 lb (104.8 kg)   LMP 04/11/2017 (Exact Date) Comment: has had continual periods  BMI 35.12 kg/m  Wt Readings from Last 3 Encounters:  06/22/21 231 lb (104.8 kg)  03/07/21 233 lb 12.8 oz (106.1 kg)  11/10/20 243 lb (110.2 kg)  GEN: NAD, appears stated age, doesn't appear chronically ill, accompanied by husband PSYCH: Cooperative, without pressured speech EYE: Conjunctivae pink, sclerae anicteric ENT: MMM, without oral ulcers CV: Nontachycardic RESP: No audible wheezing GI: NABS, soft, protuberant abdomen, rounded, without rebound MSK/EXT: Lower extremity edema present SKIN: No jaundice, no spider angiomata, no concerning rashes NEURO:  Alert & Oriented x 3, no focal deficits   REVIEW OF DATA  I reviewed the following data at the time of this encounter:  GI Procedures and Studies  November 2021 EGD - No gross lesions in esophagus proximally. Biopsied.  Salmon-colored mucosa suspicious for short-segment Barrett's esophagus distally. Biopsied to rule in/out. - Gastritis in antrum/prepylorus. Discoloration noted in the body to antrum transition. No other gross lesions in the stomach. Biopsied. - No gross lesions in the duodenal bulb, in the first portion of the duodenum and in the second portion of the duodenum. Biopsied.  November 2021 colonoscopy - Preparation of the colon was fair after copious lavage but not adequate for appropriate screening. - Perianal skin tags found on perianal exam. Hemorrhoids found on digital rectal exam. - Stool in the entire examined colon - Lavaged. - The examined portion of the ileum was normal. Biopsied. - Normal mucosa in the entire examined colon when able to be visualized. Biopsied. - Two 2 to 5 mm polyps in the rectum, removed with a cold snare. Resected and retrieved. - Anal papilla(e)  were hypertrophied. Non-bleeding non-thrombosed external and internal hemorrhoids.  Pathology Diagnosis 1. Surgical [P], duodenal - BENIGN SMALL BOWEL MUCOSA. - NO VILLOUS BLUNTING OR INCREASE IN INTRAEPITHELIAL LYMPHOCYTES. - NO DYSPLASIA OR MALIGNANCY. 2. Surgical [P], gastric antrum - MILD REACTIVE GASTROPATHY WITH EROSIONS. -  WARTHIN-STARRY IS NEGATIVE FOR HELICOBACTER PYLORI. - NO INTESTINAL METAPLASIA, DYSPLASIA, OR MALIGNANCY. 3. Surgical [P], distal esophagus - REFLUX CHANGES. - NO INTESTINAL METAPLASIA, DYSPLASIA, OR MALIGNANCY. 4. Surgical [P], esophagus, random sites - BENIGN SQUAMOUS MUCOSA. - NO INCREASE IN INTRAEPITHELIAL EOSINOPHILS. - NO INTESTINAL METAPLASIA, DYSPLASIA, OR MALIGNANCY. 5. Surgical [P], small bowel, terminal ileum (normal looking) - BENIGN SMALL BOWEL MUCOSA. - NO ACTIVE INFLAMMATION. - NO DYSPLASIA OR MALIGNANCY. 6. Surgical [P], colon, random sites - BENIGN COLONIC MUCOSA. - NO ACTIVE INFLAMMATION OR EVIDENCE OF MICROSCOPIC COLITIS. - NO DYSPLASIA OR MALIGNANCY. 7. Surgical [P], colon, rectum, polyp (2) - HYPERPLASTIC POLYP (X2 FRAGMENTS). - NO DYSPLASIA OR MALIGNANCY.  Laboratory Studies  Reviewed those in epic  Imaging Studies  No relevant studies to review   ASSESSMENT  Ms. Crutchley is a 40 y.o. femalewith a pmh significant for anxiety, MDD, alpha galactosidase deficiency, hypertension, prior C. difficile infection, ovarian cyst, chronic diarrhea, status postcholecystectomy.  The patient is seen today for evaluation and management of:  1. Chronic diarrhea   2. Chronic abdominal pain   3. Unintentional weight loss   4. History of cholecystectomy    The patient is hemodynamically stable at this time.  Clinically, the patient is reporting a significant worsening of her symptoms.  She is described a significant unintentional weight loss although our notation in the chart does not seem to correlate with a significant amount of weight loss  that she is describing.  With that being said, I do believe that some of her symptoms are functional in origin but with the patient's unintentional weight loss and recent episode of slight hypokalemia due to diarrhea then we do need to consider other evaluation.  It does not look like she has an inflammatory diarrhea based on the endoscopic evaluation and infectious work-up.  I think the patient will benefit from a 24 or 72-hour stool based test to evaluate for possums and see if we can better define a secretory versus not secretory diarrhea.  Depending on the findings we will consider evaluation of 5-HIAA, VIP, glucagon, gastrin.  We will try to reinitiate cholestyramine and monitor her labs.  If her TSH or LFTs worsen in the next 6 weeks while being on cholestyramine then we will stop it.  Time will tell.  She seems to not be tolerating Imodium or Lomotil.  She is not interested in restarting Lomotil although she only had 1 single dose.  Before we give up on that I would probably ask her to consider retrying that but time will tell.  Not sure if tincture of opium will need to be considered.  We will see.  If we cannot come up with an underlying etiology, I will be happy to place a referral for second opinion at one of the quaternary centers.  All patient questions were answered to the best of my ability, and the patient agrees to the aforementioned plan of action with follow-up as indicated.   PLAN  Laboratories as outlined below Inflammatory markers as outlined below Stool studies as outlined below - Labs to evaluate stool electrolytes and stool awesome Reinitiate cholestyramine 1 packet twice daily and may increase to 2 packets twice daily per day depending on how patient is doing May consider repeat trial of Lomotil for a longer period Tincture of opium may need to be considered at some point   Orders Placed This Encounter  Procedures   Calprotectin, Fecal   Fecal leukocytes   CT Abdomen Pelvis  W  Contrast   Sodium, stool   Potassium, stool   Osmolality, stool   Cathartic Laxative Stool   Sed Rate (ESR)   Cortisol   TSH   CBC   Comp Met (CMET)   Tissue transglutaminase, IgA   IgA     New Prescriptions   CHOLESTYRAMINE (QUESTRAN) 4 G PACKET    Take 1 packet (4 g total) by mouth 2 (two) times daily.   Modified Medications   No medications on file    Planned Follow Up No follow-ups on file.   Total Time in Face-to-Face and in Coordination of Care for patient including independent/personal interpretation/review of prior testing, medical history, examination, medication adjustment, communicating results with the patient directly, and documentation with the EHR is 40 minutes.   Justice Britain, MD [facility] Office # [phone]

## 2021-06-22 NOTE — Telephone Encounter (Signed)
Return call to [place]. I had to call [facility] and verify how to submit stool studies in specimen containers as per [facility] instructions. Pt will have 1 frozen specimen, 3 refrigerated specimen and 4 room temp specimens. Doug voiced understanding. Thankful for return call.

## 2021-06-23 ENCOUNTER — Encounter: Payer: Self-pay | Admitting: Gastroenterology

## 2021-06-23 DIAGNOSIS — K529 Noninfective gastroenteritis and colitis, unspecified: Secondary | ICD-10-CM | POA: Insufficient documentation

## 2021-06-23 DIAGNOSIS — Z9049 Acquired absence of other specified parts of digestive tract: Secondary | ICD-10-CM | POA: Insufficient documentation

## 2021-06-23 DIAGNOSIS — G8929 Other chronic pain: Secondary | ICD-10-CM | POA: Insufficient documentation

## 2021-06-23 DIAGNOSIS — R634 Abnormal weight loss: Secondary | ICD-10-CM | POA: Insufficient documentation

## 2021-06-24 ENCOUNTER — Encounter: Payer: Self-pay | Admitting: Gastroenterology

## 2021-06-26 LAB — IGA: Immunoglobulin A: 189 mg/dL (ref 47–310)

## 2021-06-26 LAB — TISSUE TRANSGLUTAMINASE, IGA: (tTG) Ab, IgA: <1 U/mL

## 2021-06-27 ENCOUNTER — Other Ambulatory Visit: Payer: Self-pay

## 2021-06-27 ENCOUNTER — Ambulatory Visit (INDEPENDENT_AMBULATORY_CARE_PROVIDER_SITE_OTHER)
Admission: RE | Admit: 2021-06-27 | Discharge: 2021-06-27 | Disposition: A | Discharge status: Home / Self Care | Payer: BC Managed Care – PPO | Source: Ambulatory Visit | Attending: Gastroenterology | Admitting: Gastroenterology

## 2021-06-27 DIAGNOSIS — G8929 Other chronic pain: Secondary | ICD-10-CM

## 2021-06-27 DIAGNOSIS — K529 Noninfective gastroenteritis and colitis, unspecified: Secondary | ICD-10-CM | POA: Diagnosis not present

## 2021-06-27 DIAGNOSIS — R109 Unspecified abdominal pain: Secondary | ICD-10-CM

## 2021-06-27 MED ORDER — IOHEXOL 300 MG/ML  SOLN
100.0000 mL | Freq: Once | INTRAMUSCULAR | Status: AC | PRN
  Administered 2021-06-27: 100 mL via INTRAVENOUS

## 2021-07-02 ENCOUNTER — Encounter: Payer: Self-pay | Admitting: Gastroenterology

## 2021-07-02 NOTE — Progress Notes (Signed)
Review of outside records  February 2022 labs Sodium 139 Potassium 3.9 BUN/creatinine 12/0.86 AST/ALT 12/12 Alk phos 106 Total bili 0.3 [facility] spotted fever IgG negative Lyme disease IgG/IgM negative TSH 38.1   Justice Britain, MD [facility] Office # [phone]

## 2021-07-13 ENCOUNTER — Encounter: Payer: Self-pay | Admitting: *Deleted

## 2021-07-16 ENCOUNTER — Ambulatory Visit: Payer: BC Managed Care – PPO | Admitting: Cardiology

## 2021-07-16 ENCOUNTER — Encounter: Payer: Self-pay | Admitting: *Deleted

## 2021-09-14 ENCOUNTER — Encounter: Payer: Self-pay | Admitting: *Deleted

## 2021-09-17 ENCOUNTER — Encounter: Payer: Self-pay | Admitting: Cardiology

## 2021-09-17 ENCOUNTER — Other Ambulatory Visit: Payer: Self-pay

## 2021-09-17 ENCOUNTER — Ambulatory Visit (INDEPENDENT_AMBULATORY_CARE_PROVIDER_SITE_OTHER): Payer: BC Managed Care – PPO

## 2021-09-17 ENCOUNTER — Ambulatory Visit: Payer: BC Managed Care – PPO | Admitting: Cardiology

## 2021-09-17 VITALS — BP 100/70 | HR 114 | Ht 68.0 in | Wt 236.0 lb

## 2021-09-17 DIAGNOSIS — R002 Palpitations: Secondary | ICD-10-CM

## 2021-09-17 DIAGNOSIS — R079 Chest pain, unspecified: Secondary | ICD-10-CM | POA: Diagnosis not present

## 2021-09-17 NOTE — Progress Notes (Signed)
Clinical Summary Gwendolyn Glass is a 40 y.o.female seen today as a new consult for the following medical problem.s  1.Chest pain/Palpitations - started about 1 year - squeezing like feeling, deep midchest. Can be 10/10 in severity. Can happen rest or with exertion. Some dizziness, headache, blurred vision, palpitations. No specific SOB - not positional - can last 15-30 minutes. Watch will report she is in afib, HRs in 150s. - has been increasing in frequency, multiple episodes per week - no caffeine, no EotH - some association with standing but not always        2.COVID  - diagnosed 08/2021, seen in ER Past Medical History:  Diagnosis Date   Allergy    Allergy to alpha-gal    Allergy to alpha-gal    Anxiety    [place] recluse spider bite 02/27/2016   C. difficile diarrhea 02/29/2016   Depression    GERD (gastroesophageal reflux disease)    Hypertension    Hypokalemia    Miscarriage    pt. states shes  spotting  and wearing a tampon   Ovarian mass, right 03/01/2016   ? complex cyst   Pregnant    Thyroid disease      Allergies  Allergen Reactions   Cinnamon Swelling and Other (See Comments)    REACTION: throat swells   Bee Venom Swelling and Other (See Comments)    REACTION: All over body swelling   Vicodin [Hydrocodone-Acetaminophen] Nausea And Vomiting   Beef-Derived Products    Dilaudid [Hydromorphone Hcl] Hives   Pork-Derived Products    Toradol [Ketorolac Tromethamine]     Hives, and chest pain     Current Outpatient Medications  Medication Sig Dispense Refill   acetaminophen (TYLENOL) 500 MG tablet Take 500 mg by mouth as needed.     ALPRAZolam (XANAX) 1 MG tablet Take 1 tablet by mouth 2 (two) times daily.  0   buPROPion (WELLBUTRIN XL) 150 MG 24 hr tablet Take 150 mg by mouth daily.     buPROPion (WELLBUTRIN XL) 300 MG 24 hr tablet Take 300 mg by mouth daily.     cholestyramine (QUESTRAN) 4 g packet Take 1 packet (4 g total) by mouth 2 (two) times  daily. 60 each 6   diphenhydrAMINE (BENADRYL) 25 mg capsule Take 25 mg by mouth every 6 (six) hours as needed for allergies.     EPINEPHrine (EPIPEN IJ) Inject as directed. As needed     losartan (COZAAR) 25 MG tablet Take 25 mg by mouth daily.     Magnesium 200 MG TABS Take 1 tablet by mouth daily.     omeprazole (PRILOSEC) 40 MG capsule Take 40 mg by mouth in the morning and at bedtime.     potassium chloride (KLOR-CON) 20 MEQ packet Take 2 packets by mouth daily.     promethazine (PHENERGAN) 25 MG tablet Take 25 mg by mouth every 8 (eight) hours as needed for nausea or vomiting.     TIROSINT 175 MCG CAPS Take 1 capsule by mouth daily.     Current Facility-Administered Medications  Medication Dose Route Frequency Provider Last Rate Last Admin   0.9 %  sodium chloride infusion  500 mL Intravenous Once Mansouraty, Telford Nab., MD         Past Surgical History:  Procedure Laterality Date   ABDOMINAL HYSTERECTOMY N/A 09/16/2018   Procedure: HYSTERECTOMY ABDOMINAL;  Surgeon: Florian Buff, MD;  Location: [facility];  Service: Gynecology;  Laterality: N/A;   APPENDECTOMY  CESAREAN SECTION     CHOLECYSTECTOMY     CHOLECYSTECTOMY  07/28/2012   Procedure: LAPAROSCOPIC CHOLECYSTECTOMY;  Surgeon: Donato Heinz, MD;  Location: [facility];  Service: General;  Laterality: N/A;   LAPAROSCOPY N/A 09/07/2014   Procedure: LAPAROSCOPY OPERATIVE;  Surgeon: Woodroe Mode, MD;  Location: [facility];  Service: Gynecology;  Laterality: N/A;   removal of lt fallopian tube     UNILATERAL SALPINGECTOMY Right 09/07/2014   Procedure: UNILATERAL SALPINGECTOMY;  Surgeon: Woodroe Mode, MD;  Location: [facility];  Service: Gynecology;  Laterality: Right;   WOUND DEBRIDEMENT Right 02/28/2016   Procedure: DEBRIDEMENT OF RIGHT SHOULDER SPIDER BITE;  Surgeon: Aviva Signs, MD;  Location: [facility];  Service: General;  Laterality: Right;     Allergies  Allergen Reactions   Cinnamon Swelling and Other (See Comments)    REACTION:  throat swells   Bee Venom Swelling and Other (See Comments)    REACTION: All over body swelling   Vicodin [Hydrocodone-Acetaminophen] Nausea And Vomiting   Beef-Derived Products    Dilaudid [Hydromorphone Hcl] Hives   Pork-Derived Products    Toradol [Ketorolac Tromethamine]     Hives, and chest pain      Family History  Problem Relation Age of Onset   Lung cancer Father 49   Colon cancer Neg Hx    Esophageal cancer Neg Hx    Rectal cancer Neg Hx    Stomach cancer Neg Hx    Adrenal disorder Neg Hx    Liver disease Neg Hx    Inflammatory bowel disease Neg Hx    Pancreatic cancer Neg Hx      Social History Gwendolyn Glass reports that she has quit smoking. Her smoking use included cigarettes. She has a 0.50 pack-year smoking history. She has never used smokeless tobacco. Gwendolyn Glass reports that she does not currently use alcohol.   Review of Systems CONSTITUTIONAL: No weight loss, fever, chills, weakness or fatigue.  HEENT: Eyes: No visual loss, blurred vision, double vision or yellow sclerae.No hearing loss, sneezing, congestion, runny nose or sore throat.  SKIN: No rash or itching.  CARDIOVASCULAR: per hpi RESPIRATORY: No shortness of breath, cough or sputum.  GASTROINTESTINAL: No anorexia, nausea, vomiting or diarrhea. No abdominal pain or blood.  GENITOURINARY: No burning on urination, no polyuria NEUROLOGICAL: No headache, dizziness, syncope, paralysis, ataxia, numbness or tingling in the extremities. No change in bowel or bladder control.  MUSCULOSKELETAL: No muscle, back pain, joint pain or stiffness.  LYMPHATICS: No enlarged nodes. No history of splenectomy.  PSYCHIATRIC: No history of depression or anxiety.  ENDOCRINOLOGIC: No reports of sweating, cold or heat intolerance. No polyuria or polydipsia.  Marland Kitchen   Physical Examination Today's Vitals   09/17/21 1336  BP: 100/70  Pulse: (!) 114  SpO2: 98%  Weight: 236 lb (107 kg)  Height: 5\' 8"  (1.727 m)   Body mass index  is 35.88 kg/m.  Gen: resting comfortably, no acute distress HEENT: no scleral icterus, pupils equal round and reactive, no palptable cervical adenopathy,  CV: RRR, no m/r/ gno jvd Resp: Clear to auscultation bilaterally GI: abdomen is soft, non-tender, non-distended, normal bowel sounds, no hepatosplenomegaly MSK: extremities are warm, no edema.  Skin: warm, no rash Neuro:  no focal deficits Psych: appropriate affect      Assessment and Plan  Chest pain/palpitations -symptoms suggestive of symptomatic arrhythmia - check 2 week zio patch and echocardiogram - occasional positional component to symptoms, orthostatics today show HR increases 25 beats lying  to standing. If benign monitor could be some component of autnomomic dysfunction playing a role.       Arnoldo Lenis, MD

## 2021-09-17 NOTE — Patient Instructions (Signed)
Medication Instructions:  Your physician recommends that you continue on your current medications as directed. Please refer to the Current Medication list given to you today.  *If you need a refill on your cardiac medications before your next appointment, please call your pharmacy*   Lab Work: None today  If you have labs (blood work) drawn today and your tests are completely normal, you will receive your results only by: [place] (if you have MyChart) OR A paper copy in the mail If you have any lab test that is abnormal or we need to change your treatment, we will call you to review the results.   Testing/Procedures: Your physician has requested that you have an echocardiogram. Echocardiography is a painless test that uses sound waves to create images of your heart. It provides your doctor with information about the size and shape of your heart and how well your heart's chambers and valves are working. This procedure takes approximately one hour. There are no restrictions for this procedure.   ZIO XT- Long Term Monitor Instructions   Your physician has requested you wear your ZIO patch monitor___14____days.   This is a single patch monitor.  [employer] supplies one patch monitor per enrollment.  Additional stickers are not available.   Please do not apply patch if you will be having a Nuclear Stress Test, Echocardiogram, Cardiac CT, MRI, or Chest Xray during the time frame you would be wearing the monitor. The patch cannot be worn during these tests.  You cannot remove and re-apply the ZIO XT patch monitor.   Your ZIO patch monitor will be sent USPS Priority mail from [facility] directly to your home address. The monitor may also be mailed to a PO BOX if home delivery is not available.   It may take 3-5 days to receive your monitor after you have been enrolled.   Once you have received you monitor, please review enclosed instructions.  Your monitor has already been  registered assigning a specific monitor serial # to you.   Applying the monitor   Shave hair from upper left chest.   Hold abrader disc by orange tab.  Rub abrader in 40 strokes over left upper chest as indicated in your monitor instructions.   Clean area with 4 enclosed alcohol pads .  Use all pads to assure are is cleaned thoroughly.  Let dry.   Apply patch as indicated in monitor instructions.  Patch will be place under collarbone on left side of chest with arrow pointing upward.   Rub patch adhesive wings for 2 minutes.Remove white label marked "1".  Remove white label marked "2".  Rub patch adhesive wings for 2 additional minutes.   While looking in a mirror, press and release button in center of patch.  A small green light will flash 3-4 times .  This will be your only indicator the monitor has been turned on.     Do not shower for the first 24 hours.  You may shower after the first 24 hours.   Press button if you feel a symptom. You will hear a small click.  Record Date, Time and Symptom in the Patient Log Book.   When you are ready to remove patch, follow instructions on last 2 pages of Patient Log Book.  Stick patch monitor onto last page of Patient Log Book.   Place Patient Log Book in [place] box.  Use locking tab on box and tape box closed securely.  The [employer]  box has prepaid postage on it.  Please place in mailbox as soon as possible.  Your physician should have your test results approximately 7 days after the monitor has been mailed back to [facility].   Call [facility] at [phone] if you have questions regarding your ZIO XT patch monitor.  Call them immediately if you see an orange light blinking on your monitor.   If your monitor falls off in less than 4 days contact our Monitor department at [phone].  If your monitor becomes loose or falls off after 4 days call [employer] at [phone] for suggestions on securing your monitor.      Follow-Up: At [facility], you and your health needs are our priority.  As part of our continuing mission to provide you with exceptional heart care, we have created designated Provider Care Teams.  These Care Teams include your primary Cardiologist (physician) and Advanced Practice Providers (APPs -  Physician Assistants and Nurse Practitioners) who all work together to provide you with the care you need, when you need it.  We recommend signing up for the patient portal called "MyChart".  Sign up information is provided on this After Visit Summary.  MyChart is used to connect with patients for Virtual Visits (Telemedicine).  Patients are able to view lab/test results, encounter notes, upcoming appointments, etc.  Non-urgent messages can be sent to your provider as well.   To learn more about what you can do with MyChart, go to [email or web address].    Your next appointment:   6 week(s)  The format for your next appointment:   In Person  Provider:   Carlyle Dolly, MD   Other Instructions None

## 2021-10-04 ENCOUNTER — Encounter: Payer: Self-pay | Admitting: Family Medicine

## 2021-10-11 ENCOUNTER — Other Ambulatory Visit: Payer: BC Managed Care – PPO

## 2021-10-19 ENCOUNTER — Ambulatory Visit (INDEPENDENT_AMBULATORY_CARE_PROVIDER_SITE_OTHER): Payer: BC Managed Care – PPO

## 2021-10-19 DIAGNOSIS — R079 Chest pain, unspecified: Secondary | ICD-10-CM

## 2021-10-19 LAB — ECHOCARDIOGRAM COMPLETE
AR max vel: 2.52 cm2
AV Area VTI: 2.3 cm2
AV Area mean vel: 2.69 cm2
AV Mean grad: 5.1 mmHg
AV Peak grad: 9.6 mmHg
Ao pk vel: 1.55 m/s
Area-P 1/2: 2.93 cm2
Calc EF: 71.1 %
S' Lateral: 2.68 cm
Single Plane A2C EF: 75.8 %
Single Plane A4C EF: 65.8 %

## 2021-10-21 NOTE — Progress Notes (Signed)
Cardiology Office Note  Date: 10/22/2021   ID: Aries, Kasa 09/08/1981, MRN [id number]  PCP:  Lanelle Bal, PA-C  Cardiologist:  Carlyle Dolly, MD Electrophysiologist:  None   Chief Complaint: 6-week follow-up  History of Present Illness: Gwendolyn Glass is a 40 y.o. female with a history of chest pain, palpitations, HTN, hypothyroidism.  She was last seen by Dr. Harl Bowie on 09/17/2021.  She had been complaining of chest pain/palpitations starting about a year prior.  She described it as a squeezing like sensation deep in the mid chest could be 10 out of 10 in severity.  Could occur with rest or with exertion.  Could have some dizziness, headache, blurred vision, palpitations.  Could last from 15 to 30 minutes.  She apparently had a watch which would report she was in atrial fibrillation with heart rates in the 150s.  She described palpitations/chest pains were increasing in frequency with multiple episodes per week.  She denied any caffeine or alcohol intake.  She had been diagnosed with COVID infection in September 2022 after presentation to the emergency room.  Symptoms were suggestive of symptomatic arrhythmia.  Plan was to get a 14-day Zio patch and echocardiogram.  Orthostatic vital signs showed heart rate increases 25 beats from lying to standing.  If benign monitor could be some component of autonomic dysfunction playing a role.  She is here for follow-up of recent echocardiogram and ZIO monitor.  We discussed the results of echocardiogram which demonstrated. EF of 65 to 70%.  No WMA's, normal diastolic parameters, no valvular abnormalities.  ZIO monitor demonstrated minimum heart rate 62, maximum heart rate 167, average heart rate of 93.  Predominant rhythm was normal sinus rhythm with isolated supraventricular ectopy less than 1%, SVE couplets were rare less than 1%.  No SVE triplets.  Isolated ventricular ectopy less than 1%..  She has issues with her thyroid.  Her TSH in  July 2022 was 18.49.  She states her thyroid medication has been adjusted on several occasions and medications seem not to affect her thyroid function to any great degree.  She is currently on 200 mcg of Tirosint daily as needed.  She continues resting tachycardia with worsening of tachycardia when going from a sitting to standing position with some associated dizziness and sometimes feeling near syncopal.    Past Medical History:  Diagnosis Date   Allergy    Allergy to alpha-gal    Allergy to alpha-gal    Anxiety    [place] recluse spider bite 02/27/2016   C. difficile diarrhea 02/29/2016   Depression    GERD (gastroesophageal reflux disease)    Hypertension    Hypokalemia    Miscarriage    pt. states shes  spotting  and wearing a tampon   Ovarian mass, right 03/01/2016   ? complex cyst   Pregnant    Thyroid disease     Past Surgical History:  Procedure Laterality Date   ABDOMINAL HYSTERECTOMY N/A 09/16/2018   Procedure: HYSTERECTOMY ABDOMINAL;  Surgeon: Florian Buff, MD;  Location: [facility];  Service: Gynecology;  Laterality: N/A;   APPENDECTOMY     CESAREAN SECTION     CHOLECYSTECTOMY     CHOLECYSTECTOMY  07/28/2012   Procedure: LAPAROSCOPIC CHOLECYSTECTOMY;  Surgeon: Donato Heinz, MD;  Location: [facility];  Service: General;  Laterality: N/A;   LAPAROSCOPY N/A 09/07/2014   Procedure: LAPAROSCOPY OPERATIVE;  Surgeon: Woodroe Mode, MD;  Location: [facility];  Service: Gynecology;  Laterality:  N/A;   removal of lt fallopian tube     UNILATERAL SALPINGECTOMY Right 09/07/2014   Procedure: UNILATERAL SALPINGECTOMY;  Surgeon: Woodroe Mode, MD;  Location: [facility];  Service: Gynecology;  Laterality: Right;   WOUND DEBRIDEMENT Right 02/28/2016   Procedure: DEBRIDEMENT OF RIGHT SHOULDER SPIDER BITE;  Surgeon: Aviva Signs, MD;  Location: [facility];  Service: General;  Laterality: Right;    Current Outpatient Medications  Medication Sig Dispense Refill   buPROPion (WELLBUTRIN XL) 300 MG 24  hr tablet Take 300 mg by mouth daily.     cholestyramine (QUESTRAN) 4 g packet Take 1 packet (4 g total) by mouth 2 (two) times daily. 60 each 6   diphenhydrAMINE (BENADRYL) 25 mg capsule Take 25 mg by mouth every 6 (six) hours as needed for allergies.     EPINEPHrine (EPIPEN IJ) Inject as directed. As needed     losartan (COZAAR) 50 MG tablet Take 50 mg by mouth daily.     metoprolol tartrate (LOPRESSOR) 25 MG tablet Take 0.5 tablets (12.5 mg total) by mouth 2 (two) times daily as needed (palpitations). 30 tablet 2   omeprazole (PRILOSEC) 40 MG capsule Take 40 mg by mouth in the morning and at bedtime.     potassium chloride (KLOR-CON) 20 MEQ packet Take 2 packets by mouth daily.     promethazine (PHENERGAN) 25 MG tablet Take 25 mg by mouth every 8 (eight) hours as needed for nausea or vomiting.     TIROSINT 200 MCG CAPS Take 1 capsule by mouth daily as needed.     Current Facility-Administered Medications  Medication Dose Route Frequency Provider Last Rate Last Admin   0.9 %  sodium chloride infusion  500 mL Intravenous Once Mansouraty, Telford Nab., MD       Allergies:  Cinnamon, Bee venom, Vicodin [hydrocodone-acetaminophen], Beef-derived products, Dilaudid [hydromorphone hcl], Pork-derived products, and Toradol [ketorolac tromethamine]   Social History: The patient  reports that she has quit smoking. Her smoking use included cigarettes. She has a 0.50 pack-year smoking history. She has never used smokeless tobacco. She reports that she does not currently use alcohol. She reports that she does not use drugs.   Family History: The patient's family history includes Lung cancer (age of onset: 64) in her father.   ROS:  Please see the history of present illness. Otherwise, complete review of systems is positive for none.  All other systems are reviewed and negative.   Physical Exam: VS:  BP (!) 142/88   Pulse (!) 103   Ht 5\' 8"  (1.727 m)   Wt 238 lb 6.4 oz (108.1 kg)   LMP 04/11/2017  (Exact Date) Comment: has had continual periods  SpO2 97%   BMI 36.25 kg/m , BMI Body mass index is 36.25 kg/m.  Wt Readings from Last 3 Encounters:  10/22/21 238 lb 6.4 oz (108.1 kg)  09/17/21 236 lb (107 kg)  07/13/21 236 lb 3.2 oz (107.1 kg)    General: Patient appears comfortable at rest. Neck: Supple, no elevated JVP or carotid bruits, no thyromegaly. Lungs: Clear to auscultation, nonlabored breathing at rest. Cardiac: Regular rate and rhythm, no S3 or significant systolic murmur, no pericardial rub. Extremities: No pitting edema, distal pulses 2+. Skin: Warm and dry. Musculoskeletal: No kyphosis. Neuropsychiatric: Alert and oriented x3, affect grossly appropriate.  ECG:    Recent Labwork: 03/07/2021: Magnesium 1.6 06/22/2021: ALT 18; AST 13; BUN 10; Creatinine, Ser 0.86; Hemoglobin 13.4; Platelets 172.0; Potassium 4.0; Sodium 140; TSH 18.49  No results found for: CHOL, TRIG, HDL, CHOLHDL, VLDL, LDLCALC, LDLDIRECT  Other Studies Reviewed Today:   ZIO monitor 10/11/2021 Patch Wear Time:  14 days and 0 hours (2022-10-17T14:26:55-0400 to 2022-10-31T14:26:59-0400)   Patient had a min HR of 62 bpm, max HR of 167 bpm, and avg HR of 93 bpm. Predominant underlying rhythm was Sinus Rhythm. Isolated SVEs were rare (<1.0%), SVE Couplets were rare (<1.0%), and no SVE Triplets were present. Isolated VEs were rare (<1.0%),  and no VE Couplets or VE Triplets were present.    Echocardiogram 10/19/2021  1. Left ventricular ejection fraction, by estimation, is 65 to 70%. The  left ventricle has normal function. The left ventricle has no regional  wall motion abnormalities. Left ventricular diastolic parameters were  normal.   2. Right ventricular systolic function is normal. The right ventricular  size is normal. Tricuspid regurgitation signal is inadequate for assessing  PA pressure.   3. The mitral valve is normal in structure. No evidence of mitral valve  regurgitation. No evidence  of mitral stenosis.   4. The aortic valve is tricuspid. Aortic valve regurgitation is not  visualized. No aortic stenosis is present.   5. The inferior vena cava is normal in size with greater than 50%  respiratory variability, suggesting right atrial pressure of 3 mmHg.   CT abdomen pelvis 06/28/2021 IMPRESSION: 1. No acute abdominal/pelvic findings, mass lesions or adenopathy. 2. Status post cholecystectomy and hysterectomy. Both ovaries are still present and are unremarkable.      Assessment and Plan:  1. Chest pain, unspecified type   2. Palpitations   3. Abnormal serum thyroid stimulating hormone (TSH) level   4. Essential hypertension   5. Chronic hypokalemia    1. Chest pain, unspecified type No current anginal or exertional symptoms.  2. Palpitations She continues with palpitations which seem to worsen with position changes such as going from a sitting to standing position her heart rate speeds up significantly and she has some occasional dizziness.  We discussed the recent results of her cardiac monitor which showed no significant arrhythmias or pauses.  We discussed starting as needed metoprolol 12.5 mg for increasing heart rate.  She states her baseline heart rate seems to be in the low 100s consistently.  Start metoprolol 12.5 mg p.o. twice daily as needed palpitations.  I asked her to monitor her heart rates, blood pressures, and symptoms for the next 2 weeks and call us with an update in 2 weeks after starting medication.  She verbalizes understanding.  3. Abnormal serum thyroid stimulating hormone (TSH) level She had an elevated TSH on 06/22/2021 of 18.49.  Previous TSH on 03/07/2021 was 3.56 with T4 of 0.9.  Prior TSH on 07/14/2020 from [place] was 16.  She is currently on Tirosint 200 mcg as needed.  She states she has been on several different thyroid medications and recently was switched to Tirosint due to being on her formulary with the insurance company  which apparently is less expensive compared to other thyroid medications per her statement.  The fact that she is taking as needed thyroid replacement is unusual.  She states despite any of the thyroid medication she has been on in the past including the current agent, it does not seem to affect her thyroid function per her statement.   4.  Essential hypertension. Blood pressure elevated today at 142 88.  Blood pressures are usually usually in the systolic 353 range per her statement.  May need to  adjust the losartan down if she is taking more of the metoprolol for palpitations/racing heart.  She plans to call us back in 2 weeks after starting metoprolol as needed with update on heart rate, blood pressures and symptoms.  Continue losartan 50 mg p.o. daily for now.  5.  Chronic hypokalemia Continue Klor-Con 20 mill equivalent packets 2 packets daily.   Medication Adjustments/Labs and Tests Ordered: Current medicines are reviewed at length with the patient today.  Concerns regarding medicines are outlined above.   Disposition: Follow-up with Dr. Harl Bowie or APP 2 months  Signed, Levell July, NP 10/22/2021 3:58 PM    [facility] [place], [place], [place] [address] Phone: [phone]; Fax: [phone]

## 2021-10-22 ENCOUNTER — Ambulatory Visit: Payer: BC Managed Care – PPO | Admitting: Family Medicine

## 2021-10-22 ENCOUNTER — Encounter: Payer: Self-pay | Admitting: Family Medicine

## 2021-10-22 VITALS — BP 142/88 | HR 103 | Ht 68.0 in | Wt 238.4 lb

## 2021-10-22 DIAGNOSIS — R002 Palpitations: Secondary | ICD-10-CM

## 2021-10-22 DIAGNOSIS — R079 Chest pain, unspecified: Secondary | ICD-10-CM | POA: Diagnosis not present

## 2021-10-22 DIAGNOSIS — I10 Essential (primary) hypertension: Secondary | ICD-10-CM

## 2021-10-22 DIAGNOSIS — E876 Hypokalemia: Secondary | ICD-10-CM

## 2021-10-22 DIAGNOSIS — R7989 Other specified abnormal findings of blood chemistry: Secondary | ICD-10-CM | POA: Diagnosis not present

## 2021-10-22 MED ORDER — METOPROLOL TARTRATE 25 MG PO TABS: 12.5000 mg | ORAL_TABLET | Freq: Two times a day (BID) | ORAL | 2 refills | Status: DC | PRN

## 2021-10-22 NOTE — Patient Instructions (Signed)
Medication Instructions:  Begin Lopressor 12.5mg  twice a day  as needed for palpitations.  Continue all other medications.     Labwork: none  Testing/Procedures: none  Follow-Up: 2 months   Any Other Special Instructions Will Be Listed Below (If Applicable). Call the office with update in 2 weeks on your blood pressure & heart rates.   If you need a refill on your cardiac medications before your next appointment, please call your pharmacy.

## 2021-10-30 ENCOUNTER — Ambulatory Visit: Payer: BC Managed Care – PPO | Admitting: Family Medicine

## 2021-12-31 ENCOUNTER — Ambulatory Visit: Payer: BC Managed Care – PPO | Admitting: Cardiology

## 2022-01-01 ENCOUNTER — Telehealth: Payer: Self-pay | Admitting: Cardiology

## 2022-01-01 MED ORDER — METOPROLOL TARTRATE 25 MG PO TABS: 12.5000 mg | ORAL_TABLET | Freq: Two times a day (BID) | ORAL | 2 refills | Status: DC | PRN

## 2022-01-01 NOTE — Telephone Encounter (Signed)
° ° ° °  1. Which medications need to be refilled? (please list name of each medication and dose if known) metoprolol tartrate (LOPRESSOR) 25 MG   2. Which pharmacy/location (including street and city if local pharmacy) is medication to be sent to?[facility]  3. Do they need a 30 day or 90 day supply? 30  Patient had appointment but tried to contact office to re-schedule unable to reach anyone. Appointment  has been re-scheduled with Dr. Harl Bowie.

## 2022-03-27 ENCOUNTER — Ambulatory Visit: Payer: BC Managed Care – PPO | Admitting: Cardiology

## 2022-03-27 ENCOUNTER — Encounter: Payer: Self-pay | Admitting: Cardiology

## 2022-03-27 VITALS — Ht 68.0 in | Wt 242.8 lb

## 2022-03-27 DIAGNOSIS — R002 Palpitations: Secondary | ICD-10-CM | POA: Diagnosis not present

## 2022-03-27 MED ORDER — METOPROLOL TARTRATE 25 MG PO TABS: 12.5000 mg | ORAL_TABLET | Freq: Two times a day (BID) | ORAL | 2 refills | Status: DC | PRN

## 2022-03-27 NOTE — Progress Notes (Signed)
? ? ? ?Clinical Summary ?Ms. Gwendolyn Glass is a 41 y.o.female seen today as a new consult for the following medical problem.s ?  ?1.Chest pain/Palpitations ?- started about 1 year ?- squeezing like feeling, deep midchest. Can be 10/10 in severity. Can happen rest or with exertion. Some dizziness, headache, blurred vision, palpitations. No specific SOB ?- not positional ?- can last 15-30 minutes. Watch will report she is in afib, HRs in 150s. ?- has been increasing in frequency, multiple episodes per week ?- no caffeine, no EotH ? ?- some association with standing but not always. At prior visit HR increased 25 beats with standing ? ? ? ?10/2021 heart monitor: rare PACs and PVCs. Min HR 62, Max HR 167, Avg HR 93 ?- started on lopressor, taking prn. Nervous to take regularly as she at times gets HRs in 40s and 50s on her watch.  ?  ? ? 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 ?- she reports recent increase in synthroid ?- seen by endo ? ? ? ? ?[place]oat, chickens, 2 ducks [place] acres ? ? ?Past Medical History:  ?Diagnosis Date  ? Allergy   ? Allergy to alpha-gal   ? Allergy to alpha-gal   ? Anxiety   ? [place] recluse spider bite 02/27/2016  ? C. difficile diarrhea 02/29/2016  ? Depression   ? GERD (gastroesophageal reflux disease)   ? Hypertension   ? Hypokalemia   ? Miscarriage   ? pt. states shes  spotting  and wearing a tampon  ? Ovarian mass, right 03/01/2016  ? ? complex cyst  ? Pregnant   ? Thyroid disease   ? ? ? ?Allergies  ?Allergen Reactions  ? Cinnamon Swelling and Other (See Comments)  ?  REACTION: throat swells  ? Bee Venom Swelling and Other (See Comments)  ?  REACTION: All over body swelling  ? Vicodin [Hydrocodone-Acetaminophen] Nausea And Vomiting  ? Beef-Derived Products   ? Dilaudid [Hydromorphone Hcl] Hives  ? Pork-Derived Products   ? Toradol [Ketorolac Tromethamine]   ?  Hives, and chest pain  ? ? ? ?Current Outpatient Medications  ?Medication Sig Dispense Refill  ? buPROPion (WELLBUTRIN XL) 300 MG 24 hr tablet Take  300 mg by mouth daily.    ? cholestyramine (QUESTRAN) 4 g packet Take 1 packet (4 g total) by mouth 2 (two) times daily. 60 each 6  ? diphenhydrAMINE (BENADRYL) 25 mg capsule Take 25 mg by mouth every 6 (six) hours as needed for allergies.    ? EPINEPHrine (EPIPEN IJ) Inject as directed. As needed    ? losartan (COZAAR) 50 MG tablet Take 50 mg by mouth daily.    ? metoprolol tartrate (LOPRESSOR) 25 MG tablet Take 0.5 tablets (12.5 mg total) by mouth 2 (two) times daily as needed (palpitations). 30 tablet 2  ? omeprazole (PRILOSEC) 40 MG capsule Take 40 mg by mouth in the morning and at bedtime.    ? potassium chloride (KLOR-CON) 20 MEQ packet Take 2 packets by mouth daily.    ? promethazine (PHENERGAN) 25 MG tablet Take 25 mg by mouth every 8 (eight) hours as needed for nausea or vomiting.    ? TIROSINT 200 MCG CAPS Take 1 capsule by mouth daily as needed.    ? ?Current Facility-Administered Medications  ?Medication Dose Route Frequency Provider Last Rate Last Admin  ? 0.9 %  sodium chloride infusion  500 mL Intravenous Once Mansouraty, Telford Nab., MD      ? ? ? ?Past Surgical History:  ?  Procedure Laterality Date  ? ABDOMINAL HYSTERECTOMY N/A 09/16/2018  ? Procedure: HYSTERECTOMY ABDOMINAL;  Surgeon: Florian Buff, MD;  Location: [facility];  Service: Gynecology;  Laterality: N/A;  ? APPENDECTOMY    ? CESAREAN SECTION    ? CHOLECYSTECTOMY    ? CHOLECYSTECTOMY  07/28/2012  ? Procedure: LAPAROSCOPIC CHOLECYSTECTOMY;  Surgeon: Donato Heinz, MD;  Location: [facility];  Service: General;  Laterality: N/A;  ? LAPAROSCOPY N/A 09/07/2014  ? Procedure: LAPAROSCOPY OPERATIVE;  Surgeon: Woodroe Mode, MD;  Location: [facility];  Service: Gynecology;  Laterality: N/A;  ? removal of lt fallopian tube    ? UNILATERAL SALPINGECTOMY Right 09/07/2014  ? Procedure: UNILATERAL SALPINGECTOMY;  Surgeon: Woodroe Mode, MD;  Location: [facility];  Service: Gynecology;  Laterality: Right;  ? WOUND DEBRIDEMENT Right 02/28/2016  ? Procedure: DEBRIDEMENT  OF RIGHT SHOULDER SPIDER BITE;  Surgeon: Aviva Signs, MD;  Location: [facility];  Service: General;  Laterality: Right;  ? ? ? ?Allergies  ?Allergen Reactions  ? Cinnamon Swelling and Other (See Comments)  ?  REACTION: throat swells  ? Bee Venom Swelling and Other (See Comments)  ?  REACTION: All over body swelling  ? Vicodin [Hydrocodone-Acetaminophen] Nausea And Vomiting  ? Beef-Derived Products   ? Dilaudid [Hydromorphone Hcl] Hives  ? Pork-Derived Products   ? Toradol [Ketorolac Tromethamine]   ?  Hives, and chest pain  ? ? ? ? ?Family History  ?Problem Relation Age of Onset  ? Lung cancer Father 36  ? Colon cancer Neg Hx   ? Esophageal cancer Neg Hx   ? Rectal cancer Neg Hx   ? Stomach cancer Neg Hx   ? Adrenal disorder Neg Hx   ? Liver disease Neg Hx   ? Inflammatory bowel disease Neg Hx   ? Pancreatic cancer Neg Hx   ? ? ? ?Social History ?Ms. Gwendolyn Glass reports that she has quit smoking. Her smoking use included cigarettes. She has a 0.50 pack-year smoking history. She has never used smokeless tobacco. ?Ms. Gwendolyn Glass reports that she does not currently use alcohol. ? ? ?Review of Systems ?CONSTITUTIONAL: No weight loss, fever, chills, weakness or fatigue.  ?HEENT: Eyes: No visual loss, blurred vision, double vision or yellow sclerae.No hearing loss, sneezing, congestion, runny nose or sore throat.  ?SKIN: No rash or itching.  ?CARDIOVASCULAR: per hpi ?RESPIRATORY: No shortness of breath, cough or sputum.  ?GASTROINTESTINAL: No anorexia, nausea, vomiting or diarrhea. No abdominal pain or blood.  ?GENITOURINARY: No burning on urination, no polyuria ?NEUROLOGICAL: No headache, dizziness, syncope, paralysis, ataxia, numbness or tingling in the extremities. No change in bowel or bladder control.  ?MUSCULOSKELETAL: No muscle, back pain, joint pain or stiffness.  ?LYMPHATICS: No enlarged nodes. No history of splenectomy.  ?PSYCHIATRIC: No history of depression or anxiety.  ?ENDOCRINOLOGIC: No reports of sweating, cold or heat  intolerance. No polyuria or polydipsia.  ?. ? ? ?Physical Examination ?Today's Vitals  ? 03/27/22 1444 03/27/22 1446 03/27/22 1449  ?SpO2: 98% 99% 96%  ?Weight: 242 lb 12.8 oz (110.1 kg)    ?Height: '5\' 8"'$  (1.727 m)    ? ?Body mass index is 36.92 kg/m?. ? ?Gen: resting comfortably, no acute distress ?HEENT: no scleral icterus, pupils equal round and reactive, no palptable cervical adenopathy,  ?CV: RRR, no m/r/g, no jvd ?Resp: Clear to auscultation bilaterally ?GI: abdomen is soft, non-tender, non-distended, normal bowel sounds, no hepatosplenomegaly ?MSK: extremities are warm, no edema.  ?Skin: warm, no rash ?Neuro:  no focal  deficits ?Psych: appropriate affect ? ? ?Diagnostic Studies ? ?10/2021 heart monitor ?14 day monitor ?Rare supraventricular ectopy in the form of isolated PACs and couplets ?Rare ventricular ectopy in the form of PVCs ?Triggered events but no reported associated symptoms ?No significant arrythmias ? ? ?Assessment and Plan  ?Chest pain/palpitations ?-benign monitor other than rare PACs, PVCs ?- symptoms often positional with standing component. At prior visit HR increased 25 bpm with standing, not quite diagnostic of POTs but suggests possibly on the spectrum ?- discussed aggressive hydration, continue prn lopressor.  ? ? ? ? ? ?Arnoldo Lenis, M.D. ?

## 2022-03-27 NOTE — Patient Instructions (Addendum)
Medication Instructions:  Continue all current medications.   Labwork: none  Testing/Procedures: none  Follow-Up: 6 months   Any Other Special Instructions Will Be Listed Below (If Applicable).   If you need a refill on your cardiac medications before your next appointment, please call your pharmacy.  

## 2022-04-10 LAB — TSH: TSH: 15.2 — AB (ref 0.41–5.90)

## 2022-04-19 ENCOUNTER — Encounter: Payer: Self-pay | Admitting: Gastroenterology

## 2022-05-23 LAB — TSH: TSH: 11.7 — AB (ref 0.41–5.90)

## 2022-07-24 ENCOUNTER — Telehealth: Payer: Self-pay | Admitting: Cardiology

## 2022-07-24 ENCOUNTER — Encounter: Payer: Self-pay | Admitting: *Deleted

## 2022-07-24 NOTE — Telephone Encounter (Signed)
Reports active chest pain rated 2/10. Denies SOB or dizziness Reports intermittent chest pain for 2 months that she made two ED visit for at [facility] available in [place] Reports passing out once 2 days ago and did not go to the ED. HR was 148 during that time and did not check BP Reports dizziness for the past week. Says she is very dizzy when she stands up. Says she does not drive BP & HR checked while on phone and is BP 141/85 & HR 105  Medications reviewed Advised that she shouldn't drive with these symptoms Advised for dizziness and passing out, she needed to go to the ED for an evaluation Advised to keep visit scheduled already on Friday Verbalized understanding of plan

## 2022-07-24 NOTE — Telephone Encounter (Signed)
Pt c/o of Chest Pain: STAT if CP now or developed within 24 hours  1. Are you having CP right now? Chest pains at this time  2. Are you experiencing any other symptoms (ex. SOB, nausea, vomiting, sweating)? Had a episode where she passed out before and today, fekt like she was going to pass out  3. How long have you been experiencing CP?  It been going on for a couple of months  4. Is your CP continuous or coming and going? Comes and goes- feels like a squeezing  around her heart  5. Have you taken Nitroglycerin? no ?

## 2022-07-24 NOTE — Telephone Encounter (Signed)
Agree with your assessment and plan   Zandra Abts MD

## 2022-07-25 NOTE — Progress Notes (Signed)
Cardiology Office Note:    Date:  07/26/2022   ID:  Gwendolyn Glass, DOB 30-Apr-1981, MRN [id number]  PCP:  Gwendolyn Bal, PA-C   [facility] Providers Cardiologist:  Carlyle Dolly, MD     Referring MD: Gwendolyn Bal, PA-C   Chief Complaint: syncope  History of Present Illness:    Gwendolyn Glass is a pleasant 41 y.o. female with a hx of chest pain, palpitations, and hypothyroidism.   She established care with cardiology in 2022 for chest pain and palpitations and was last seen in our office by Dr. Harl Bowie on 03/27/2022. She reported increase in symptoms when standing and HR increased 25 bpm. Cardiac monitor was benign other than rare PACs and PVCs.  She was advised to increase hydration and to use as needed Lopressor.  She called our office on 07/24/2022 with complaints of chest pains and an episode where she felt like she was going to pass out. Reported that she passed out on 8/21. Reported symptoms for a couple of months., chest pain rate 2/10. Denied SOB or dizziness. Two ED visits to [facility] with no specific diagnosis.  At time of phone call BP was 141/85 and HR 105. Passed out on 8/21, was getting something out of the closet got suddenly dizzy and fell out on the floor, thinks she lost consciousness. Had eaten  Today, she is here with her husband, Marden Noble. States she got very dizzy walking in today. Had to stop to rest so that she would not pass out.  On 8/21 she was in a closet getting something and became very dizzy and next thing she knew she was on the floor.  She has visual changes as well as a roaring sound in her ears. Everything goes black. Work-up in ED was unremarkable. Has increased hydration, drinks 6-7 bottles most days, sometimes less, however symptoms have worsened recently. Has terrible chest pain that accompanies the pre-syncope, feels like a crushing pain. States despite increase in hydration and use of occasional metoprolol, symptoms have progressively worsened.  Has seen multiple providers and had multiple tests with no clear diagnosis. Has not tried compression. Thyroid is closely monitored by endocrinologist. She was just switched to brand name Synthroid. Reports BP is generally much higher than today.   Past Medical History:  Diagnosis Date   Allergy    Allergy to alpha-gal    Allergy to alpha-gal    Anxiety    [place] recluse spider bite 02/27/2016   C. difficile diarrhea 02/29/2016   Depression    GERD (gastroesophageal reflux disease)    Hypertension    Hypokalemia    Miscarriage    pt. states shes  spotting  and wearing a tampon   Ovarian mass, right 03/01/2016   ? complex cyst   Pregnant    Thyroid disease     Past Surgical History:  Procedure Laterality Date   ABDOMINAL HYSTERECTOMY N/A 09/16/2018   Procedure: HYSTERECTOMY ABDOMINAL;  Surgeon: Florian Buff, MD;  Location: [facility];  Service: Gynecology;  Laterality: N/A;   APPENDECTOMY     CESAREAN SECTION     CHOLECYSTECTOMY     CHOLECYSTECTOMY  07/28/2012   Procedure: LAPAROSCOPIC CHOLECYSTECTOMY;  Surgeon: Donato Heinz, MD;  Location: [facility];  Service: General;  Laterality: N/A;   LAPAROSCOPY N/A 09/07/2014   Procedure: LAPAROSCOPY OPERATIVE;  Surgeon: Woodroe Mode, MD;  Location: [place] [facility];  Service: Gynecology;  Laterality: N/A;   removal of lt fallopian tube  UNILATERAL SALPINGECTOMY Right 09/07/2014   Procedure: UNILATERAL SALPINGECTOMY;  Surgeon: Woodroe Mode, MD;  Location: [place] [facility];  Service: Gynecology;  Laterality: Right;   WOUND DEBRIDEMENT Right 02/28/2016   Procedure: DEBRIDEMENT OF RIGHT SHOULDER SPIDER BITE;  Surgeon: Aviva Signs, MD;  Location: [facility];  Service: General;  Laterality: Right;    Current Medications: Current Meds  Medication Sig   ALPRAZolam (XANAX) 1 MG tablet Take 1 mg by mouth 2 (two) times daily as needed.   buPROPion (WELLBUTRIN XL) 150 MG 24 hr tablet Take 150 mg by mouth every morning.   cholestyramine (QUESTRAN) 4 g packet Take 1  packet (4 g total) by mouth 2 (two) times daily.   cromolyn (GASTROCROM) 100 MG/5ML solution Take by mouth as directed.   cromolyn (GASTROCROM) 100 MG/5ML solution Take by mouth 2 (two) times daily.   diphenhydrAMINE (BENADRYL) 25 mg capsule Take 25 mg by mouth every 6 (six) hours as needed for allergies.   EPINEPHrine (EPIPEN IJ) Inject as directed. As needed   famotidine (PEPCID) 40 MG tablet Take 40 mg by mouth daily.   ivabradine (CORLANOR) 7.5 MG TABS tablet Take 1 tablet (7.5 mg total) by mouth as directed. If Heart Rate after taking lopressor is still above 60 will take one additional dose of (7.5 mg) tablet 30 minutes after taking lopressor.   KETOTIFEN FUMARATE OP Take by mouth as needed.   losartan (COZAAR) 50 MG tablet Take 50 mg by mouth daily.   metoprolol succinate (TOPROL XL) 25 MG 24 hr tablet Take 1 tablet (25 mg total) by mouth every evening.   metoprolol succinate (TOPROL XL) 25 MG 24 hr tablet Take 1 tablet (25 mg total) by mouth every evening.   metoprolol tartrate (LOPRESSOR) 25 MG tablet Take 0.5 tablets (12.5 mg total) by mouth 2 (two) times daily as needed (palpitations).   metoprolol tartrate (LOPRESSOR) 50 MG tablet Take 2 tablets (100 mg total) by mouth as directed. Will take both tablets (100 mg ) 2 hours prior to CT scan if Heart Rate is above 60.   omeprazole (PRILOSEC) 40 MG capsule Take 40 mg by mouth in the morning and at bedtime.   potassium chloride (KLOR-CON) 20 MEQ packet Take 2 packets by mouth daily.   PREVALITE 4 g packet Take by mouth 2 (two) times daily.   promethazine (PHENERGAN) 25 MG tablet Take 25 mg by mouth every 8 (eight) hours as needed for nausea or vomiting.   SYNTHROID 100 MCG tablet Take 250 mcg by mouth daily.   Current Facility-Administered Medications for the 07/26/22 encounter (Office Visit) with Ann Maki, Lanice Schwab, NP  Medication   0.9 %  sodium chloride infusion     Allergies:   Alpha-gal, Cinnamon, Bee venom, Vicodin  [hydrocodone-acetaminophen], Dilaudid [hydromorphone hcl], and Toradol [ketorolac tromethamine]   Social History   Socioeconomic History   Marital status: Married    Spouse name: Not on file   Number of children: Not on file   Years of education: Not on file   Highest education level: Not on file  Occupational History   Not on file  Tobacco Use   Smoking status: Former    Packs/day: 0.50    Years: 1.00    Total pack years: 0.50    Types: Cigarettes   Smokeless tobacco: Never  Vaping Use   Vaping Use: Never used  Substance and Sexual Activity   Alcohol use: Not Currently    Comment: occ.   Drug use: No  Sexual activity: Not Currently    Birth control/protection: Surgical    Comment: hyst  Other Topics Concern   Not on file  Social History Narrative   Not on file   Social Determinants of Health   Financial Resource Strain: Not on file  Food Insecurity: Not on file  Transportation Needs: Not on file  Physical Activity: Not on file  Stress: Not on file  Social Connections: Not on file     Family History: The patient's family history includes Lung cancer (age of onset: 76) in her father. There is no history of Colon cancer, Esophageal cancer, Rectal cancer, Stomach cancer, Adrenal disorder, Liver disease, Inflammatory bowel disease, or Pancreatic cancer.  ROS:   Please see the history of present illness.    + tachycardia + presyncope + syncope All other systems reviewed and are negative.  Labs/Other Studies Reviewed:    The following studies were reviewed today:  10/2021 heart monitor 14 day monitor Rare supraventricular ectopy in the form of isolated PACs and couplets Rare ventricular ectopy in the form of PVCs Triggered events but no reported associated symptoms No significant arrythmias   Echo 10/19/21  1. Left ventricular ejection fraction, by estimation, is 65 to 70%. The  left ventricle has normal function. The left ventricle has no regional  wall  motion abnormalities. Left ventricular diastolic parameters were  normal.   2. Right ventricular systolic function is normal. The right ventricular  size is normal. Tricuspid regurgitation signal is inadequate for assessing  PA pressure.   3. The mitral valve is normal in structure. No evidence of mitral valve  regurgitation. No evidence of mitral stenosis.   4. The aortic valve is tricuspid. Aortic valve regurgitation is not  visualized. No aortic stenosis is present.   5. The inferior vena cava is normal in size with greater than 50%  respiratory variability, suggesting right atrial pressure of 3 mmHg.    Recent Labs: No results found for requested labs within last 365 days.  Recent Lipid Panel No results found for: "CHOL", "TRIG", "HDL", "CHOLHDL", "VLDL", "LDLCALC", "LDLDIRECT"   Risk Assessment/Calculations:       Physical Exam:    VS:  BP 112/84   Pulse (!) 103   Ht '5\' 8"'$  (1.727 m)   Wt 241 lb 9.6 oz (109.6 kg)   LMP 04/11/2017 (Exact Date) Comment: has had continual periods  SpO2 97%   BMI 36.74 kg/m     Wt Readings from Last 3 Encounters:  07/26/22 241 lb 9.6 oz (109.6 kg)  03/27/22 242 lb 12.8 oz (110.1 kg)  10/22/21 238 lb 6.4 oz (108.1 kg)     GEN:  Well nourished, well developed in no acute distress HEENT: Normal NECK: No JVD; No carotid bruits CARDIAC: RRR, no murmurs, rubs, gallops RESPIRATORY:  Clear to auscultation without rales, wheezing or rhonchi  ABDOMEN: Soft, non-tender, non-distended MUSCULOSKELETAL:  No edema; No deformity. 2+ pedal pulses, equal bilaterally SKIN: Warm and dry NEUROLOGIC:  Alert and oriented x 3 PSYCHIATRIC:  Normal affect   EKG:  EKG is ordered today.  The ekg ordered today demonstrates sinus tachycardia at 103 bpm, inferior TWI, no acute change from previous  Diagnoses:    1. Precordial pain   2. Palpitations    Assessment and Plan:     Chest pain: She is having chest pain associated with syncope.  No previous  ischemic evaluation.  We will get a coronary CTA for coronary anatomy. Per her report, HR ranges  drastically from 50-100 bpm. Will have her take Lopressor 100 mg if HR > 60 bpm 2 hours prior to CT. If HR < 60 bpm she will take Lopressor 50 mg. If after taking Lopressor 100 mg HR remains > 60 bpm, she will take Ivabradine 7.5 mg.   Pre-syncope/Syncope: Frequently has to stop walking or sit for a while when changing positions due to vision goes black, roaring in her head, and she often times will pass out. BP and HR increase when she goes from sitting to standing so she does not meet criteria for POTS although she has similarity in symptoms. No improvement since improved hydration. I have asked her to try compression stockings and to liberalize sodium (healthy rather than unhealthy sources). We will place a 30 day monitor for evaluation of arrhythmia that may be contributing. No evidence of adrenal abnormality on imaging/lab tests that may be contributing.     Disposition: 2 months with APP  Medication Adjustments/Labs and Tests Ordered: Current medicines are reviewed at length with the patient today.  Concerns regarding medicines are outlined above.  Orders Placed This Encounter  Procedures   CT CORONARY MORPH W/CTA COR W/SCORE W/CA W/CM &/OR WO/CM   Basic Metabolic Panel (BMET)   EKG 12-Lead   Meds ordered this encounter  Medications   ivabradine (CORLANOR) 7.5 MG TABS tablet    Sig: Take 1 tablet (7.5 mg total) by mouth as directed. If Heart Rate after taking lopressor is still above 60 will take one additional dose of (7.5 mg) tablet 30 minutes after taking lopressor.    Dispense:  1 tablet    Refill:  0   metoprolol tartrate (LOPRESSOR) 50 MG tablet    Sig: Take 2 tablets (100 mg total) by mouth as directed. Will take both tablets (100 mg ) 2 hours prior to CT scan if Heart Rate is above 60.    Dispense:  2 tablet    Refill:  0   metoprolol succinate (TOPROL XL) 25 MG 24 hr tablet    Sig:  Take 1 tablet (25 mg total) by mouth every evening.    Dispense:  30 tablet    Refill:  0   metoprolol succinate (TOPROL XL) 25 MG 24 hr tablet    Sig: Take 1 tablet (25 mg total) by mouth every evening.    Dispense:  90 tablet    Refill:  3    Patient Instructions  Medication Instructions:   CHANGE Toprol one (1) tablet by mouth ( 25 mg) in the evening.   *If you need a refill on your cardiac medications before your next appointment, please call your pharmacy*   Lab Work:  Your physician recommends that you have lab work today. BMET   If you have labs (blood work) drawn today and your tests are completely normal, you will receive your results only by: [place] (if you have MyChart) OR A paper copy in the mail If you have any lab test that is abnormal or we need to change your treatment, we will call you to review the results.   Testing/Procedures:    Your cardiac CT will be scheduled at one of the below locations:   [facility] [address] [place], [place] [address] [phone]  If scheduled at [facility], please arrive at the [facility] and Children's Entrance (Entrance C2) of [facility] 30 minutes prior to test start time. You can use the FREE valet parking offered at  entrance C (encouraged to control the heart rate for the test)  Proceed to the [facility] Radiology Department (first floor) to check-in and test prep.  All radiology patients and guests should use entrance C2 at [facility], accessed from [facility], even though the hospital's physical address listed is [address].    Please follow these instructions carefully (unless otherwise directed):   On the Night Before the Test: Be sure to Drink plenty of water. Do not consume any caffeinated/decaffeinated beverages or chocolate 12 hours prior to your test. Do not take any antihistamines 12 hours prior to your test.  On the Day of  the Test: Drink plenty of water until 1 hour prior to the test. Do not eat any food 4 hours prior to the test. You may take your regular medications prior to the test.  Take metoprolol (Lopressor) two hours prior to test IF YOUR HEART RATE IS ABOVE 60 BPM. IF AFTER 30 MINUTES TAKING THE LOPRESSOR AND HEART RATE IS STILL ABOVE 60 BPM YOU WILL TAKE ONE TIME DOSE OF CORLANOR (7.5 MG) TABLET. FEMALES- please wear underwire-free bra if available, avoid dresses & tight clothing     After the Test: Drink plenty of water. After receiving IV contrast, you may experience a mild flushed feeling. This is normal. On occasion, you may experience a mild rash up to 24 hours after the test. This is not dangerous. If this occurs, you can take Benadryl 25 mg and increase your fluid intake. If you experience trouble breathing, this can be serious. If it is severe call 911 IMMEDIATELY. If it is mild, please call our office. If you take any of these medications: Glipizide/Metformin, Avandament, Glucavance, please do not take 48 hours after completing test unless otherwise instructed.  We will call to schedule your test 2-4 weeks out understanding that some insurance companies will need an authorization prior to the service being performed.   For non-scheduling related questions, please contact the cardiac imaging nurse navigator should you have any questions/concerns: Marchia Bond, Cardiac Imaging Nurse Navigator Gordy Clement, Cardiac Imaging Nurse Navigator [place] [facility] Direct Office Dial: [phone]   For scheduling needs, including cancellations and rescheduling, please call [place], [phone].    Follow-Up: At [facility], you and your health needs are our priority.  As part of our continuing mission to provide you with exceptional heart care, we have created designated Provider Care Teams.  These Care Teams include your primary Cardiologist (physician) and Advanced  Practice Providers (APPs -  Physician Assistants and Nurse Practitioners) who all work together to provide you with the care you need, when you need it.  We recommend signing up for the patient portal called "MyChart".  Sign up information is provided on this After Visit Summary.  MyChart is used to connect with patients for Virtual Visits (Telemedicine).  Patients are able to view lab/test results, encounter notes, upcoming appointments, etc.  Non-urgent messages can be sent to your provider as well.   To learn more about what you can do with MyChart, go to [email or web address].    Your next appointment:   2 month(s)  The format for your next appointment:   In Person  Provider:   Christen Bame, NP          Important Information About Sugar         Signed, Emmaline Life, NP  07/26/2022 11:51 AM    [place]

## 2022-07-26 ENCOUNTER — Encounter: Payer: Self-pay | Admitting: *Deleted

## 2022-07-26 ENCOUNTER — Ambulatory Visit: Payer: BC Managed Care – PPO | Admitting: Nurse Practitioner

## 2022-07-26 ENCOUNTER — Other Ambulatory Visit: Payer: Self-pay | Admitting: *Deleted

## 2022-07-26 ENCOUNTER — Encounter: Payer: Self-pay | Admitting: Nurse Practitioner

## 2022-07-26 VITALS — BP 112/84 | HR 103 | Ht 68.0 in | Wt 241.6 lb

## 2022-07-26 DIAGNOSIS — R002 Palpitations: Secondary | ICD-10-CM | POA: Diagnosis not present

## 2022-07-26 DIAGNOSIS — R55 Syncope and collapse: Secondary | ICD-10-CM

## 2022-07-26 DIAGNOSIS — R072 Precordial pain: Secondary | ICD-10-CM | POA: Diagnosis not present

## 2022-07-26 LAB — BASIC METABOLIC PANEL
BUN/Creatinine Ratio: 9 (ref 9–23)
BUN: 8 mg/dL (ref 6–20)
CO2: 23 mmol/L (ref 20–29)
Calcium: 9.1 mg/dL (ref 8.7–10.2)
Chloride: 105 mmol/L (ref 96–106)
Creatinine, Ser: 0.92 mg/dL (ref 0.57–1.00)
Glucose: 80 mg/dL (ref 70–99)
Potassium: 4.2 mmol/L (ref 3.5–5.2)
Sodium: 142 mmol/L (ref 134–144)
eGFR: 82 mL/min/{1.73_m2} (ref >59)

## 2022-07-26 MED ORDER — METOPROLOL SUCCINATE ER 25 MG PO TB24: 25.0000 mg | ORAL_TABLET | Freq: Every evening | ORAL | 3 refills | Status: DC

## 2022-07-26 MED ORDER — METOPROLOL TARTRATE 50 MG PO TABS: 100.0000 mg | ORAL_TABLET | ORAL | 0 refills | Status: DC

## 2022-07-26 MED ORDER — IVABRADINE HCL 7.5 MG PO TABS: 7.5000 mg | ORAL_TABLET | ORAL | 0 refills | Status: DC

## 2022-07-26 MED ORDER — METOPROLOL SUCCINATE ER 25 MG PO TB24: 25.0000 mg | ORAL_TABLET | Freq: Every evening | ORAL | 0 refills | Status: DC

## 2022-07-26 NOTE — Patient Instructions (Signed)
Medication Instructions:   CHANGE Toprol one (1) tablet by mouth ( 25 mg) in the evening.   *If you need a refill on your cardiac medications before your next appointment, please call your pharmacy*   Lab Work:  Your physician recommends that you have lab work today. BMET   If you have labs (blood work) drawn today and your tests are completely normal, you will receive your results only by: [place] (if you have MyChart) OR A paper copy in the mail If you have any lab test that is abnormal or we need to change your treatment, we will call you to review the results.   Testing/Procedures:    Your cardiac CT will be scheduled at one of the below locations:   [facility] [address] [place], [place] [address] [phone]  If scheduled at [facility], please arrive at the [facility] and Children's Entrance (Entrance C2) of [facility] 30 minutes prior to test start time. You can use the FREE valet parking offered at entrance C (encouraged to control the heart rate for the test)  Proceed to the [facility] Radiology Department (first floor) to check-in and test prep.  All radiology patients and guests should use entrance C2 at [facility], accessed from [facility], even though the hospital's physical address listed is [address].    Please follow these instructions carefully (unless otherwise directed):   On the Night Before the Test: Be sure to Drink plenty of water. Do not consume any caffeinated/decaffeinated beverages or chocolate 12 hours prior to your test. Do not take any antihistamines 12 hours prior to your test.  On the Day of the Test: Drink plenty of water until 1 hour prior to the test. Do not eat any food 4 hours prior to the test. You may take your regular medications prior to the test.  Take metoprolol (Lopressor) two hours prior to test IF YOUR HEART RATE IS ABOVE 60 BPM. IF AFTER 30  MINUTES TAKING THE LOPRESSOR AND HEART RATE IS STILL ABOVE 60 BPM YOU WILL TAKE ONE TIME DOSE OF CORLANOR (7.5 MG) TABLET. FEMALES- please wear underwire-free bra if available, avoid dresses & tight clothing     After the Test: Drink plenty of water. After receiving IV contrast, you may experience a mild flushed feeling. This is normal. On occasion, you may experience a mild rash up to 24 hours after the test. This is not dangerous. If this occurs, you can take Benadryl 25 mg and increase your fluid intake. If you experience trouble breathing, this can be serious. If it is severe call 911 IMMEDIATELY. If it is mild, please call our office. If you take any of these medications: Glipizide/Metformin, Avandament, Glucavance, please do not take 48 hours after completing test unless otherwise instructed.  We will call to schedule your test 2-4 weeks out understanding that some insurance companies will need an authorization prior to the service being performed.   For non-scheduling related questions, please contact the cardiac imaging nurse navigator should you have any questions/concerns: Marchia Bond, Cardiac Imaging Nurse Navigator Gordy Clement, Cardiac Imaging Nurse Navigator [place] [facility] Direct Office Dial: [phone]   For scheduling needs, including cancellations and rescheduling, please call [place], [phone].    Follow-Up: At [facility], you and your health needs are our priority.  As part of our continuing mission to provide you with exceptional heart care, we have created designated Provider  Care Teams.  These Care Teams include your primary Cardiologist (physician) and Advanced Practice Providers (APPs -  Physician Assistants and Nurse Practitioners) who all work together to provide you with the care you need, when you need it.  We recommend signing up for the patient portal called "MyChart".  Sign up information is provided on this After Visit  Summary.  MyChart is used to connect with patients for Virtual Visits (Telemedicine).  Patients are able to view lab/test results, encounter notes, upcoming appointments, etc.  Non-urgent messages can be sent to your provider as well.   To learn more about what you can do with MyChart, go to [email or web address].    Your next appointment:   2 month(s)  The format for your next appointment:   In Person  Provider:   Christen Bame, NP          Important Information About Sugar

## 2022-07-26 NOTE — Progress Notes (Signed)
Patient ID: Gwendolyn Glass, female   DOB: 05-24-1981, 41 y.o.   MRN: [id number] Patient enrolled for [employer] to ship a 30 day cardiac event monitor to her home with estimated delivery date of 08/02/22. Patient aware she is to apply monitor after her Coronary CTA.  Dr. Carlyle Dolly to read.

## 2022-07-29 ENCOUNTER — Telehealth: Payer: Self-pay | Admitting: Nurse Practitioner

## 2022-07-29 NOTE — Telephone Encounter (Signed)
Returned call to [place] to verify medications dosages and instructions.  Take metoprolol (Lopressor) two hours prior to test IF YOUR HEART RATE IS ABOVE 60 BPM. IF AFTER 30 MINUTES TAKING THE LOPRESSOR AND HEART RATE IS STILL ABOVE 60 BPM YOU WILL TAKE ONE TIME DOSE OF CORLANOR (7.5 MG) TABLET.

## 2022-07-29 NOTE — Telephone Encounter (Signed)
Pt c/o medication issue:  1. Name of Medication: ivabradine (CORLANOR) 7.5 MG TABS tablet  2. How are you currently taking this medication (dosage and times per day)? Take 1 tablet (7.5 mg total) by mouth as directed. If Heart Rate after taking lopressor is still above 60 will take one additional dose of (7.5 mg) tablet 30 minutes after taking lopressor.  3. Are you having a reaction (difficulty breathing--STAT)?   4. What is your medication issue? Pharmacy called wanting to make sure its okay for pt to take one and a half of this medication because they don't have 7.5 mg, they only have 5

## 2022-08-14 ENCOUNTER — Telehealth (HOSPITAL_COMMUNITY): Payer: Self-pay | Admitting: *Deleted

## 2022-08-14 NOTE — Telephone Encounter (Signed)
Reaching out to patient to offer assistance regarding upcoming cardiac imaging study; pt verbalizes understanding of appt date/time, but she states she has a cold.  Additionally patient has a severe allergy to Alpha-gal.  Nitroglycerin has components related to bovine byproducts. In light of allergy and the cold, I recommend we cancel the appointment and reach out to the ordering provider. Patient was in agreement with plan.  Gordy Clement RN Navigator Cardiac Imaging [facility] Heart and Vascular [phone] office [phone] cell

## 2022-08-15 ENCOUNTER — Ambulatory Visit (HOSPITAL_COMMUNITY): Admission: RE | Admit: 2022-08-15 | Payer: BC Managed Care – PPO | Source: Ambulatory Visit

## 2022-08-17 LAB — TSH: TSH: 17.1 — AB (ref 0.41–5.90)

## 2022-08-21 ENCOUNTER — Ambulatory Visit: Payer: BC Managed Care – PPO | Attending: Nurse Practitioner

## 2022-08-21 ENCOUNTER — Other Ambulatory Visit: Payer: Self-pay | Admitting: *Deleted

## 2022-08-21 DIAGNOSIS — R55 Syncope and collapse: Secondary | ICD-10-CM | POA: Diagnosis not present

## 2022-08-21 DIAGNOSIS — R002 Palpitations: Secondary | ICD-10-CM

## 2022-08-21 DIAGNOSIS — R072 Precordial pain: Secondary | ICD-10-CM

## 2022-08-22 ENCOUNTER — Other Ambulatory Visit: Payer: Self-pay | Admitting: Nurse Practitioner

## 2022-08-22 DIAGNOSIS — R072 Precordial pain: Secondary | ICD-10-CM

## 2022-08-22 DIAGNOSIS — R002 Palpitations: Secondary | ICD-10-CM

## 2022-08-26 ENCOUNTER — Telehealth (HOSPITAL_COMMUNITY): Payer: Self-pay | Admitting: *Deleted

## 2022-08-26 NOTE — Telephone Encounter (Signed)
Patient given detailed instructions per Myocardial Perfusion Study Information Sheet for the test on 09/02/2022 at 10:00. Patient notified to arrive 15 minutes early and that it is imperative to arrive on time for appointment to keep from having the test rescheduled.  If you need to cancel or reschedule your appointment, please call the office within 24 hours of your appointment. . Patient verbalized understanding.Gwendolyn Glass

## 2022-09-02 ENCOUNTER — Ambulatory Visit (HOSPITAL_COMMUNITY): Payer: BC Managed Care – PPO | Attending: Internal Medicine

## 2022-09-02 DIAGNOSIS — R002 Palpitations: Secondary | ICD-10-CM | POA: Insufficient documentation

## 2022-09-02 DIAGNOSIS — R072 Precordial pain: Secondary | ICD-10-CM | POA: Diagnosis not present

## 2022-09-02 LAB — MYOCARDIAL PERFUSION IMAGING
Angina Index: 0
Duke Treadmill Score: 5
Estimated workload: 7
Exercise duration (min): 5 min
LV dias vol: 48 mL (ref 46–106)
LV sys vol: 15 mL
MPHR: 182 {beats}/min
Nuc Stress EF: 69 %
Peak HR: 162 {beats}/min
Percent HR: 89182 %
RPE: 18
Rest HR: 97 {beats}/min
Rest Nuclear Isotope Dose: 9.7 mCi
SDS: 1
SRS: 0
SSS: 1
ST Depression (mm): 0 mm
Stress Nuclear Isotope Dose: 31.1 mCi
TID: 1.09

## 2022-09-02 MED ORDER — TECHNETIUM TC 99M TETROFOSMIN IV KIT
9.7000 | PACK | Freq: Once | INTRAVENOUS | Status: AC | PRN
  Administered 2022-09-02: 9.7 via INTRAVENOUS

## 2022-09-02 MED ORDER — TECHNETIUM TC 99M TETROFOSMIN IV KIT
31.1000 | PACK | Freq: Once | INTRAVENOUS | Status: AC | PRN
  Administered 2022-09-02: 31.1 via INTRAVENOUS

## 2022-09-24 ENCOUNTER — Ambulatory Visit: Payer: BC Managed Care – PPO | Admitting: Nurse Practitioner

## 2022-10-07 ENCOUNTER — Encounter: Payer: Self-pay | Admitting: Cardiology

## 2022-10-07 ENCOUNTER — Ambulatory Visit: Payer: BC Managed Care – PPO | Attending: Cardiology | Admitting: Cardiology

## 2022-10-07 VITALS — BP 120/88 | HR 95 | Ht 68.0 in | Wt 243.8 lb

## 2022-10-07 DIAGNOSIS — R42 Dizziness and giddiness: Secondary | ICD-10-CM

## 2022-10-07 DIAGNOSIS — R002 Palpitations: Secondary | ICD-10-CM | POA: Diagnosis not present

## 2022-10-07 DIAGNOSIS — R079 Chest pain, unspecified: Secondary | ICD-10-CM

## 2022-10-07 MED ORDER — LOSARTAN POTASSIUM 25 MG PO TABS: 25.0000 mg | ORAL_TABLET | Freq: Every day | ORAL | 6 refills | Status: DC

## 2022-10-07 NOTE — Patient Instructions (Addendum)
Medication Instructions:  Decrease Losartan to '25mg'$  daily  Continue all other medications.     Labwork: none  Testing/Procedures: none  Follow-Up: 6 months     Any Other Special Instructions Will Be Listed Below (If Applicable). Update the office in 1 week on your dizziness.    If you need a refill on your cardiac medications before your next appointment, please call your pharmacy.

## 2022-10-07 NOTE — Progress Notes (Signed)
Clinical Summary Ms. Armistead is a 41 y.o.female   1.Chest pain/Palpitations - started about 1 year - squeezing like feeling, deep midchest. Can be 10/10 in severity. Can happen rest or with exertion. Some dizziness, headache, blurred vision, palpitations. No specific SOB - not positional - can last 15-30 minutes. Watch will report she is in afib, HRs in 150s. - has been increasing in frequency, multiple episodes per week - no caffeine, no EotH   - some association with standing but not always. At prior visit HR increased 25 beats with standing       10/2021 heart monitor: rare PACs and PVCs. Min HR 62, Max HR 167, Avg HR 93 - started on lopressor, taking prn. Nervous to take regularly as she at times gets HRs in 40s and 50s on her watch.    10/2021 echo: LVEF 65-70%, no WMAs, normal RV function    - 09/2022 nuclear stress: no ischemia 09/2022 monitor: 30 day monitor, data from 64% of planned monitored time. No significant arrhythmias. Had some issues with monitor, would not stay on.   - palpitations remain sporadic. Still often triggered with standing and walking. Can get lightheaded, dizzy.  - has chronic diarrhea regularly - drinks about 6-7 bottles of water, working to increase sodium intake. Dizziness improves with highs sodium intake.  - ongoing dizziness with standing.      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 - she reports recent increase in synthroid - seen by endo         Has multiple animals at home: [place], chickens, 2 ducks on [place] acres  [employer] on psych class Past Medical History:  Diagnosis Date   Allergy    Allergy to alpha-gal    Allergy to alpha-gal    Anxiety    Bogdan recluse spider bite 02/27/2016   C. difficile diarrhea 02/29/2016   Depression    GERD (gastroesophageal reflux disease)    Hypertension    Hypokalemia    Miscarriage    pt. states shes  spotting  and wearing a tampon   Ovarian mass, right 03/01/2016   ? complex cyst    Pregnant    Thyroid disease      Allergies  Allergen Reactions   Alpha-Gal Anaphylaxis   Cinnamon Swelling and Other (See Comments)    REACTION: throat swells   Bee Venom Swelling and Other (See Comments)    REACTION: All over body swelling   Vicodin [Hydrocodone-Acetaminophen] Nausea And Vomiting   Dilaudid [Hydromorphone Hcl] Hives   Toradol [Ketorolac Tromethamine]     Hives, and chest pain     Current Outpatient Medications  Medication Sig Dispense Refill   ALPRAZolam (XANAX) 1 MG tablet Take 1 mg by mouth 2 (two) times daily as needed.     buPROPion (WELLBUTRIN XL) 150 MG 24 hr tablet Take 150 mg by mouth every morning.     cholestyramine (QUESTRAN) 4 g packet Take 1 packet (4 g total) by mouth 2 (two) times daily. 60 each 6   cromolyn (GASTROCROM) 100 MG/5ML solution Take by mouth as directed.     cromolyn (GASTROCROM) 100 MG/5ML solution Take by mouth 2 (two) times daily.     diphenhydrAMINE (BENADRYL) 25 mg capsule Take 25 mg by mouth every 6 (six) hours as needed for allergies.     EPINEPHrine (EPIPEN IJ) Inject as directed. As needed     famotidine (PEPCID) 40 MG tablet Take 40 mg by mouth daily.     ivabradine (  CORLANOR) 7.5 MG TABS tablet Take 1 tablet (7.5 mg total) by mouth as directed. If Heart Rate after taking lopressor is still above 60 will take one additional dose of (7.5 mg) tablet 30 minutes after taking lopressor. 1 tablet 0   KETOTIFEN FUMARATE OP Take by mouth as needed.     losartan (COZAAR) 50 MG tablet Take 50 mg by mouth daily.     metoprolol succinate (TOPROL XL) 25 MG 24 hr tablet Take 1 tablet (25 mg total) by mouth every evening. 30 tablet 0   metoprolol succinate (TOPROL XL) 25 MG 24 hr tablet Take 1 tablet (25 mg total) by mouth every evening. 90 tablet 3   metoprolol tartrate (LOPRESSOR) 25 MG tablet Take 0.5 tablets (12.5 mg total) by mouth 2 (two) times daily as needed (palpitations). 30 tablet 2   metoprolol tartrate (LOPRESSOR) 50 MG tablet  Take 2 tablets (100 mg total) by mouth as directed. Will take both tablets (100 mg ) 2 hours prior to CT scan if Heart Rate is above 60. 2 tablet 0   omeprazole (PRILOSEC) 40 MG capsule Take 40 mg by mouth in the morning and at bedtime.     potassium chloride (KLOR-CON) 20 MEQ packet Take 2 packets by mouth daily.     PREVALITE 4 g packet Take by mouth 2 (two) times daily.     promethazine (PHENERGAN) 25 MG tablet Take 25 mg by mouth every 8 (eight) hours as needed for nausea or vomiting.     SYNTHROID 100 MCG tablet Take 250 mcg by mouth daily.     Current Facility-Administered Medications  Medication Dose Route Frequency Provider Last Rate Last Admin   0.9 %  sodium chloride infusion  500 mL Intravenous Once Mansouraty, Netty Starring., MD         Past Surgical History:  Procedure Laterality Date   ABDOMINAL HYSTERECTOMY N/A 09/16/2018   Procedure: HYSTERECTOMY ABDOMINAL;  Surgeon: Lazaro Arms, MD;  Location: [facility];  Service: Gynecology;  Laterality: N/A;   APPENDECTOMY     CESAREAN SECTION     CHOLECYSTECTOMY     CHOLECYSTECTOMY  07/28/2012   Procedure: LAPAROSCOPIC CHOLECYSTECTOMY;  Surgeon: Fabio Bering, MD;  Location: [facility];  Service: General;  Laterality: N/A;   LAPAROSCOPY N/A 09/07/2014   Procedure: LAPAROSCOPY OPERATIVE;  Surgeon: Adam Phenix, MD;  Location: [facility];  Service: Gynecology;  Laterality: N/A;   removal of lt fallopian tube     UNILATERAL SALPINGECTOMY Right 09/07/2014   Procedure: UNILATERAL SALPINGECTOMY;  Surgeon: Adam Phenix, MD;  Location: [facility];  Service: Gynecology;  Laterality: Right;   WOUND DEBRIDEMENT Right 02/28/2016   Procedure: DEBRIDEMENT OF RIGHT SHOULDER SPIDER BITE;  Surgeon: Franky Macho, MD;  Location: [facility];  Service: General;  Laterality: Right;     Allergies  Allergen Reactions   Alpha-Gal Anaphylaxis   Cinnamon Swelling and Other (See Comments)    REACTION: throat swells   Bee Venom Swelling and Other (See Comments)     REACTION: All over body swelling   Vicodin [Hydrocodone-Acetaminophen] Nausea And Vomiting   Dilaudid [Hydromorphone Hcl] Hives   Toradol [Ketorolac Tromethamine]     Hives, and chest pain      Family History  Problem Relation Age of Onset   Lung cancer Father 60   Colon cancer Neg Hx    Esophageal cancer Neg Hx    Rectal cancer Neg Hx    Stomach cancer Neg Hx  Adrenal disorder Neg Hx    Liver disease Neg Hx    Inflammatory bowel disease Neg Hx    Pancreatic cancer Neg Hx      Social History Ms. [place] reports that she has quit smoking. Her smoking use included cigarettes. She has a 0.50 pack-year smoking history. She has never used smokeless tobacco. Ms. Ragucci reports that she does not currently use alcohol.   Review of Systems CONSTITUTIONAL: No weight loss, fever, chills, weakness or fatigue.  HEENT: Eyes: No visual loss, blurred vision, double vision or yellow sclerae.No hearing loss, sneezing, congestion, runny nose or sore throat.  SKIN: No rash or itching.  CARDIOVASCULAR: per hpi RESPIRATORY: No shortness of breath, cough or sputum.  GASTROINTESTINAL: No anorexia, nausea, vomiting or diarrhea. No abdominal pain or blood.  GENITOURINARY: No burning on urination, no polyuria NEUROLOGICAL:per hpi MUSCULOSKELETAL: No muscle, back pain, joint pain or stiffness.  LYMPHATICS: No enlarged nodes. No history of splenectomy.  PSYCHIATRIC: No history of depression or anxiety.  ENDOCRINOLOGIC: No reports of sweating, cold or heat intolerance. No polyuria or polydipsia.  Marland Kitchen   Physical Examination Today's Vitals   10/07/22 1600  BP: 120/88  Pulse: 95  SpO2: 97%  Weight: 243 lb 12.8 oz (110.6 kg)  Height: 5\' 8"  (1.727 m)   Body mass index is 37.07 kg/m.  Gen: resting comfortably, no acute distress HEENT: no scleral icterus, pupils equal round and reactive, no palptable cervical adenopathy,  CV: RRR, no m/r/g no jvd Resp: Clear to auscultation bilaterally GI:  abdomen is soft, non-tender, non-distended, normal bowel sounds, no hepatosplenomegaly MSK: extremities are warm, no edema.  Skin: warm, no rash Neuro:  no focal deficits Psych: appropriate affect   Diagnostic Studies  10/2021 heart monitor 14 day monitor Rare supraventricular ectopy in the form of isolated PACs and couplets Rare ventricular ectopy in the form of PVCs Triggered events but no reported associated symptoms No significant arrythmias    09/2022 nuclear stress The study is normal. The study is low risk.   No ST deviation was noted.   LV perfusion is normal.   Left ventricular function is normal. Nuclear stress EF: 69 %. The left ventricular ejection fraction is hyperdynamic (>65%). End diastolic cavity size is normal. End systolic cavity size is normal.   Prior study not available for comparison.   Normal perfusion. LVEF 69% with normal wall motion. This is a low risk study. No prior for comparison.   09/2022 monitor 30 day monitor. Data available from 64% of planned monitored time   Min HR 64, Max HR 160, Avg HR 90   No symptoms reported   Telemetry tracings show sinus rhythm and sinus tachycardia   Assessment and Plan   Chest pain - benign stress test recently, no further cardiac testing at this time   2. Palpitations/Dizziness - benign recent monitor -long history of symptoms, primarily occur with standing. Prior orthostatics have been borderline but not confirmatiory, at prior visit HR increasd 25 bpm with standing not quite diagnostic for POTs. I think symptoms are very consistent with her being on the spectrum of autonomic dysfunction.  - symptoms improved with hydration but not resolved - will order compression stockings, lower losartan and accept higher bp's overall. Could consider midodrine or florinef in the future.        Antoine Poche, M.D.

## 2022-10-08 ENCOUNTER — Encounter: Payer: Self-pay | Admitting: Nurse Practitioner

## 2022-10-08 ENCOUNTER — Ambulatory Visit: Payer: BC Managed Care – PPO | Admitting: Nurse Practitioner

## 2022-10-08 VITALS — BP 127/86 | HR 101 | Ht 68.0 in | Wt 242.4 lb

## 2022-10-08 DIAGNOSIS — E039 Hypothyroidism, unspecified: Secondary | ICD-10-CM | POA: Diagnosis not present

## 2022-10-08 NOTE — Progress Notes (Signed)
Endocrinology Consult Note                                         10/08/2022, 2:11 PM  Subjective:   Subjective    Gwendolyn Glass is a 41 y.o.-year-old female patient being seen in consultation for hypothyroidism referred by Lanelle Bal, PA-C.   Past Medical History:  Diagnosis Date   Allergy    Allergy to alpha-gal    Allergy to alpha-gal    Anxiety    Tabbert recluse spider bite 02/27/2016   C. difficile diarrhea 02/29/2016   Depression    GERD (gastroesophageal reflux disease)    Hypertension    Hypokalemia    Miscarriage    pt. states shes  spotting  and wearing a tampon   Ovarian mass, right 03/01/2016   ? complex cyst   Pregnant    Thyroid disease     Past Surgical History:  Procedure Laterality Date   ABDOMINAL HYSTERECTOMY N/A 09/16/2018   Procedure: HYSTERECTOMY ABDOMINAL;  Surgeon: Florian Buff, MD;  Location: [facility];  Service: Gynecology;  Laterality: N/A;   APPENDECTOMY     CESAREAN SECTION     CHOLECYSTECTOMY     CHOLECYSTECTOMY  07/28/2012   Procedure: LAPAROSCOPIC CHOLECYSTECTOMY;  Surgeon: Donato Heinz, MD;  Location: [facility];  Service: General;  Laterality: N/A;   LAPAROSCOPY N/A 09/07/2014   Procedure: LAPAROSCOPY OPERATIVE;  Surgeon: Woodroe Mode, MD;  Location: [facility];  Service: Gynecology;  Laterality: N/A;   removal of lt fallopian tube     UNILATERAL SALPINGECTOMY Right 09/07/2014   Procedure: UNILATERAL SALPINGECTOMY;  Surgeon: Woodroe Mode, MD;  Location: [facility];  Service: Gynecology;  Laterality: Right;   WOUND DEBRIDEMENT Right 02/28/2016   Procedure: DEBRIDEMENT OF RIGHT SHOULDER SPIDER BITE;  Surgeon: Aviva Signs, MD;  Location: [facility];  Service: General;  Laterality: Right;    Social History   Socioeconomic History   Marital status: Married    Spouse name: Not on file   Number of children: Not on file   Years of education: Not on file   Highest education  level: Not on file  Occupational History   Not on file  Tobacco Use   Smoking status: Former    Packs/day: 0.50    Years: 1.00    Total pack years: 0.50    Types: Cigarettes    Passive exposure: Never   Smokeless tobacco: Never  Vaping Use   Vaping Use: Never used  Substance and Sexual Activity   Alcohol use: Not Currently    Comment: occ.   Drug use: No   Sexual activity: Not Currently    Birth control/protection: Surgical    Comment: hyst  Other Topics Concern   Not on file  Social History Narrative   Not on file   Social Determinants of Health   Financial Resource Strain: Not on file  Food Insecurity: Not on file  Transportation Needs: Not on file  Physical Activity: Not on  file  Stress: Not on file  Social Connections: Not on file    Family History  Problem Relation Age of Onset   Lung cancer Father 73   Colon cancer Neg Hx    Esophageal cancer Neg Hx    Rectal cancer Neg Hx    Stomach cancer Neg Hx    Adrenal disorder Neg Hx    Liver disease Neg Hx    Inflammatory bowel disease Neg Hx    Pancreatic cancer Neg Hx     Outpatient Encounter Medications as of 10/08/2022  Medication Sig   ALPRAZolam (XANAX) 1 MG tablet Take 1 mg by mouth 2 (two) times daily as needed.   buPROPion (WELLBUTRIN XL) 150 MG 24 hr tablet Take 150 mg by mouth every morning.   cholestyramine (QUESTRAN) 4 g packet Take 1 packet (4 g total) by mouth 2 (two) times daily.   cromolyn (GASTROCROM) 100 MG/5ML solution Take by mouth as directed.   diphenhydrAMINE (BENADRYL) 25 mg capsule Take 25 mg by mouth every 6 (six) hours as needed for allergies.   EPINEPHrine (EPIPEN IJ) Inject as directed. As needed   famotidine (PEPCID) 40 MG tablet Take 40 mg by mouth daily.   KETOTIFEN FUMARATE OP Take by mouth as needed.   losartan (COZAAR) 25 MG tablet Take 1 tablet (25 mg total) by mouth daily.   omeprazole (PRILOSEC) 40 MG capsule Take 40 mg by mouth in the morning and at bedtime.   potassium  chloride (KLOR-CON) 20 MEQ packet Take 2 packets by mouth daily.   promethazine (PHENERGAN) 25 MG tablet Take 25 mg by mouth every 8 (eight) hours as needed for nausea or vomiting.   TIROSINT 100 MCG CAPS Take 1 capsule by mouth daily.   metoprolol tartrate (LOPRESSOR) 25 MG tablet Take 0.5 tablets (12.5 mg total) by mouth 2 (two) times daily as needed (palpitations).   [DISCONTINUED] ivabradine (CORLANOR) 7.5 MG TABS tablet Take 1 tablet (7.5 mg total) by mouth as directed. If Heart Rate after taking lopressor is still above 60 will take one additional dose of (7.5 mg) tablet 30 minutes after taking lopressor. (Patient not taking: Reported on 10/07/2022)   [DISCONTINUED] metoprolol succinate (TOPROL XL) 25 MG 24 hr tablet Take 1 tablet (25 mg total) by mouth every evening. (Patient not taking: Reported on 10/07/2022)   [DISCONTINUED] metoprolol succinate (TOPROL XL) 25 MG 24 hr tablet Take 1 tablet (25 mg total) by mouth every evening. (Patient not taking: Reported on 10/07/2022)   [DISCONTINUED] metoprolol tartrate (LOPRESSOR) 50 MG tablet Take 2 tablets (100 mg total) by mouth as directed. Will take both tablets (100 mg ) 2 hours prior to CT scan if Heart Rate is above 60. (Patient not taking: Reported on 10/07/2022)   Facility-Administered Encounter Medications as of 10/08/2022  Medication   0.9 %  sodium chloride infusion    ALLERGIES: Allergies  Allergen Reactions   Alpha-Gal Anaphylaxis   Cinnamon Swelling and Other (See Comments)    REACTION: throat swells   Bee Venom Swelling and Other (See Comments)    REACTION: All over body swelling   Vicodin [Hydrocodone-Acetaminophen] Nausea And Vomiting   Dilaudid [Hydromorphone Hcl] Hives   Toradol [Ketorolac Tromethamine]     Hives, and chest pain   VACCINATION STATUS: Immunization History  Administered Date(s) Administered   Influenza,inj,Quad PF,6+ Mos 02/28/2016   Moderna Sars-Covid-2 Vaccination 02/08/2020, 03/07/2020     HPI    Gwendolyn Glass is a patient with the above medical  history. she was diagnosed with hypothyroidism which required subsequent initiation of thyroid hormone replacement therapy. she was given various doses of Levothyroxine, branded-Synthroid, and Tirosint over the years, currently on Tirosint 100 micrograms. she reports compliance to this medication:  Taking it daily on empty stomach with water, separated by >30 minutes before breakfast and other medications, and by at least 4 hours from calcium, iron, PPIs, multivitamins.  She notes she is taking cholestyramine daily to help with absorption problems.  She describes her disease course as starting out as diarrhea which came after every meal or liquid.  Then she was diagnosed with alpha-gal about 2 years ago and then, slowly she started to develop other symptoms, dizziness, abdominal cramping, headaches, near syncopal episodes.  She has seen many different specialists, was being worked up for POTS but wanted to have her thyroid managed first at it may be affecting her other symptoms.  Around 6 weeks ago, she was taking Synthroid 250 mcg po daily before breakfast but then felt terrible, therefore she reduced the dose herself to 100 mcg of Tirosint (which I am assuming she had left over from all the trials she has been on in the past).  She does note of all the different thyroid medications she has tried, Tirosint was the best tolerated.   I reviewed patient's thyroid tests:  Lab Results  Component Value Date   TSH 17.10 (A) 08/17/2022   TSH 11.70 (A) 05/23/2022   TSH 15.20 (A) 04/10/2022   TSH 18.49 (H) 06/22/2021   TSH 3.56 03/07/2021   FREET4 0.90 03/07/2021    Pt denies feeling nodules in neck, hoarseness, dysphagia/odynophagia, SOB with lying down.  she does not know of family history of thyroid disorders including cancer.  No history of radiation therapy to head or neck.  No recent use of iodine supplements.  Denies use of Biotin containing  supplements.  She has had thyroid ultrasound in August 2022 which was normal with exception of noting cell heterogeneity.  I reviewed her chart and she also has a history of GERD, tobacco abuse, malabsorption issues.   ROS:  Constitutional: no weight gain/loss, + fatigue, no subjective hyperthermia, no subjective hypothermia Eyes: no blurry vision, no xerophthalmia ENT: no sore throat, no nodules palpated in throat, no dysphagia/odynophagia, no hoarseness Cardiovascular: no chest pain, no SOB, + intermittent palpitations, no leg swelling Respiratory: no cough, no SOB Gastrointestinal: no nausea/vomiting, + chronic diarrhea Musculoskeletal: no muscle/joint aches Skin: no rashes Neurological: no tremors, no numbness, no tingling, + intermittent dizziness Psychiatric: no depression, no anxiety   Objective:   Objective     BP 127/86 (BP Location: Right Arm, Patient Position: Sitting, Cuff Size: Large)   Pulse (!) 101   Ht '5\' 8"'$  (1.727 m)   Wt 242 lb 6.4 oz (110 kg)   LMP 04/11/2017 (Exact Date) Comment: has had continual periods  BMI 36.86 kg/m  Wt Readings from Last 3 Encounters:  10/08/22 242 lb 6.4 oz (110 kg)  10/07/22 243 lb 12.8 oz (110.6 kg)  09/02/22 241 lb (109.3 kg)    BP Readings from Last 3 Encounters:  10/08/22 127/86  10/07/22 120/88  07/26/22 112/84     Constitutional:  Body mass index is 36.86 kg/m., not in acute distress, normal state of mind Eyes: PERRLA, EOMI, no exophthalmos ENT: moist mucous membranes, mild thyromegaly R>L, no cervical lymphadenopathy Cardiovascular: normal precordial activity, RRR, no murmur/rubs/gallops Respiratory:  adequate breathing efforts, no gross chest deformity, Clear to auscultation bilaterally Gastrointestinal: abdomen  soft, non-tender, no distension, bowel sounds present Musculoskeletal: no gross deformities, strength intact in all four extremities Skin: moist, warm, no rashes Neurological: no tremor with  outstretched hands, deep tendon reflexes normal in BLE.   CMP ( most recent) CMP     Component Value Date/Time   NA 142 07/26/2022 1115   K 4.2 07/26/2022 1115   CL 105 07/26/2022 1115   CO2 23 07/26/2022 1115   GLUCOSE 80 07/26/2022 1115   GLUCOSE 85 06/22/2021 1125   BUN 8 07/26/2022 1115   CREATININE 0.92 07/26/2022 1115   CALCIUM 9.1 07/26/2022 1115   PROT 7.0 06/22/2021 1125   PROT 6.0 09/28/2020 1443   ALBUMIN 4.2 06/22/2021 1125   AST 13 06/22/2021 1125   ALT 18 06/22/2021 1125   ALKPHOS 83 06/22/2021 1125   BILITOT 0.3 06/22/2021 1125   GFRNONAA >60 11/10/2020 1622   GFRAA >60 09/17/2018 0525     Diabetic Labs (most recent): No results found for: "HGBA1C", "MICROALBUR"   Lipid Panel ( most recent) Lipid Panel  No results found for: "CHOL", "TRIG", "HDL", "CHOLHDL", "VLDL", "LDLCALC", "LDLDIRECT", "LABVLDL"     Lab Results  Component Value Date   TSH 17.10 (A) 08/17/2022   TSH 11.70 (A) 05/23/2022   TSH 15.20 (A) 04/10/2022   TSH 18.49 (H) 06/22/2021   TSH 3.56 03/07/2021   FREET4 0.90 03/07/2021      Thyroid US from 07/30/22 US Thyroid  Anatomical Region Laterality Modality  Neck -- Ultrasound   Impression  Thyroid heterogeneity without discrete nodule.  The above is in keeping with the ACR TI-RADS recommendations - J Am Coll Radiol 2017;14:587-595.   Electronically Signed   By: Jerilynn Mages.  Shick M.D.   On: 07/30/2021 13:33 Narrative  CLINICAL DATA:  Palpable left thyroid nodule, neck fullness  EXAM: THYROID ULTRASOUND  TECHNIQUE: Ultrasound examination of the thyroid gland and adjacent soft tissues was performed.  COMPARISON:  None.  FINDINGS: Parenchymal Echotexture: Moderately heterogenous  Isthmus: 3 mm  Right lobe: 4.3 x 1.8 x 1.3 cm  Left lobe: 3.6 x 1.8 x 1.3 cm  _________________________________________________________  Estimated total number of nodules >/= 1 cm: 0  Number of spongiform nodules >/=  2 cm not described  below (TR1): 0  Number of mixed cystic and solid nodules >/= 1.5 cm not described below (TR2): 0  _________________________________________________________  Nonspecific thyroid heterogeneity suggesting medical thyroid disease. No hypervascularity or nodule. No adenopathy. Procedure Note  Gasper Sells, MD - 07/30/2021 Formatting of this note might be different from the original. CLINICAL DATA:  Palpable left thyroid nodule, neck fullness  EXAM: THYROID ULTRASOUND  TECHNIQUE: Ultrasound examination of the thyroid gland and adjacent soft tissues was performed.  COMPARISON:  None.  FINDINGS: Parenchymal Echotexture: Moderately heterogenous  Isthmus: 3 mm  Right lobe: 4.3 x 1.8 x 1.3 cm  Left lobe: 3.6 x 1.8 x 1.3 cm  _________________________________________________________  Estimated total number of nodules >/= 1 cm: 0  Number of spongiform nodules >/=  2 cm not described below (TR1): 0  Number of mixed cystic and solid nodules >/= 1.5 cm not described below (TR2): 0  _________________________________________________________  Nonspecific thyroid heterogeneity suggesting medical thyroid disease. No hypervascularity or nodule. No adenopathy.  IMPRESSION: Thyroid heterogeneity without discrete nodule.  The above is in keeping with the ACR TI-RADS recommendations - J Am Coll Radiol 2017;14:587-595.   Electronically Signed   By: Jerilynn Mages.  Shick M.D.   On: 07/30/2021 13:33 Exam End: 07/30/21 13:32  Specimen Collected: 07/30/21 13:32 Last Resulted: 07/30/21 13:33  Received From: [facility]:   ASSESSMENT / PLAN:  1. Hypothyroidism-unspecified   Patient with long-standing hypothyroidism, on thyroid hormone replacement therapy. On physical exam, patient does not have gross goiter, thyroid nodules, or neck compression symptoms.  She is advised to continue Tirosint 100 mcg po daily before breakfast (can take it when gets up in night  time to use restroom).  - We discussed about correct intake of levothyroxine, at fasting, with water, separated by at least 30 minutes from breakfast, and separated by more than 4 hours from calcium, iron, multivitamins, acid reflux medications (PPIs). -Patient is made aware of the fact that thyroid hormone replacement is needed for life, dose to be adjusted by periodic monitoring of thyroid function tests.  - Will check thyroid tests before next visit: TSH, Free T3, free T4.  Will also check antibodies to help classify her dysfunction.  -Due to absence of clinical goiter and essentially normal [place] from a year ago, no need for additional thyroid ultrasound at this time.    - Time spent with the patient: 45 minutes, of which >50% was spent in obtaining information about her symptoms, reviewing her previous labs, evaluations, and treatments, counseling her about her hypothyroidism, and developing a plan to confirm the diagnosis and long term treatment as necessary. Please refer to "Patient Self Inventory" in the Media tab for reviewed elements of pertinent patient history.  [name] participated in the discussions, expressed understanding, and voiced agreement with the above plans.  All questions were answered to her satisfaction. she is encouraged to contact clinic should she have any questions or concerns prior to her return visit.   FOLLOW UP PLAN:  Return in about 1 week (around 10/15/2022) for Thyroid follow up, Previsit labs.  Rayetta Pigg, [facility] [address] [place], [place] [address] Phone: [phone] Fax: [phone]  10/08/2022, 2:11 PM

## 2022-10-08 NOTE — Patient Instructions (Signed)

## 2022-10-09 LAB — TSH: TSH: 6.42 u[IU]/mL — ABNORMAL HIGH (ref 0.450–4.500)

## 2022-10-09 LAB — THYROGLOBULIN ANTIBODY: Thyroglobulin Antibody: 2.2 IU/mL — ABNORMAL HIGH (ref 0.0–0.9)

## 2022-10-09 LAB — THYROID PEROXIDASE ANTIBODY: Thyroperoxidase Ab SerPl-aCnc: 160 IU/mL — ABNORMAL HIGH (ref 0–34)

## 2022-10-09 LAB — T3, FREE: T3, Free: 2.5 pg/mL (ref 2.0–4.4)

## 2022-10-09 LAB — T4, FREE: Free T4: 1.11 ng/dL (ref 0.82–1.77)

## 2022-10-17 ENCOUNTER — Encounter: Payer: Self-pay | Admitting: Nurse Practitioner

## 2022-10-17 ENCOUNTER — Ambulatory Visit: Payer: BC Managed Care – PPO | Admitting: Nurse Practitioner

## 2022-10-17 VITALS — BP 122/82 | HR 74 | Ht 68.0 in | Wt 242.2 lb

## 2022-10-17 DIAGNOSIS — M62838 Other muscle spasm: Secondary | ICD-10-CM

## 2022-10-17 DIAGNOSIS — R519 Headache, unspecified: Secondary | ICD-10-CM

## 2022-10-17 DIAGNOSIS — K529 Noninfective gastroenteritis and colitis, unspecified: Secondary | ICD-10-CM

## 2022-10-17 DIAGNOSIS — R55 Syncope and collapse: Secondary | ICD-10-CM

## 2022-10-17 DIAGNOSIS — G90A Postural orthostatic tachycardia syndrome (POTS): Secondary | ICD-10-CM | POA: Diagnosis not present

## 2022-10-17 DIAGNOSIS — E038 Other specified hypothyroidism: Secondary | ICD-10-CM

## 2022-10-17 DIAGNOSIS — G8929 Other chronic pain: Secondary | ICD-10-CM

## 2022-10-17 DIAGNOSIS — E063 Autoimmune thyroiditis: Secondary | ICD-10-CM

## 2022-10-17 MED ORDER — TIROSINT 112 MCG PO CAPS: 112.0000 ug | ORAL_CAPSULE | Freq: Every day | ORAL | 0 refills | Status: DC

## 2022-10-17 NOTE — Progress Notes (Signed)
Endocrinology Consult Note                                         10/17/2022, 4:08 PM  Subjective:   Subjective    Gwendolyn Glass is a 41 y.o.-year-old female patient being seen in consultation for hypothyroidism referred by Lanelle Bal, PA-C.   Past Medical History:  Diagnosis Date   Allergy    Allergy to alpha-gal    Allergy to alpha-gal    Anxiety    [place] recluse spider bite 02/27/2016   C. difficile diarrhea 02/29/2016   Depression    GERD (gastroesophageal reflux disease)    Hypertension    Hypokalemia    Miscarriage    pt. states shes  spotting  and wearing a tampon   Ovarian mass, right 03/01/2016   ? complex cyst   Pregnant    Thyroid disease     Past Surgical History:  Procedure Laterality Date   ABDOMINAL HYSTERECTOMY N/A 09/16/2018   Procedure: HYSTERECTOMY ABDOMINAL;  Surgeon: Florian Buff, MD;  Location: [facility];  Service: Gynecology;  Laterality: N/A;   APPENDECTOMY     CESAREAN SECTION     CHOLECYSTECTOMY     CHOLECYSTECTOMY  07/28/2012   Procedure: LAPAROSCOPIC CHOLECYSTECTOMY;  Surgeon: Donato Heinz, MD;  Location: [facility];  Service: General;  Laterality: N/A;   LAPAROSCOPY N/A 09/07/2014   Procedure: LAPAROSCOPY OPERATIVE;  Surgeon: Woodroe Mode, MD;  Location: [facility];  Service: Gynecology;  Laterality: N/A;   removal of lt fallopian tube     UNILATERAL SALPINGECTOMY Right 09/07/2014   Procedure: UNILATERAL SALPINGECTOMY;  Surgeon: Woodroe Mode, MD;  Location: [facility];  Service: Gynecology;  Laterality: Right;   WOUND DEBRIDEMENT Right 02/28/2016   Procedure: DEBRIDEMENT OF RIGHT SHOULDER SPIDER BITE;  Surgeon: Aviva Signs, MD;  Location: [facility];  Service: General;  Laterality: Right;    Social History   Socioeconomic History   Marital status: Married    Spouse name: Not on file   Number of children: Not on file   Years of education: Not on file   Highest education  level: Not on file  Occupational History   Not on file  Tobacco Use   Smoking status: Former    Packs/day: 0.50    Years: 1.00    Total pack years: 0.50    Types: Cigarettes    Passive exposure: Never   Smokeless tobacco: Never  Vaping Use   Vaping Use: Never used  Substance and Sexual Activity   Alcohol use: Not Currently    Comment: occ.   Drug use: No   Sexual activity: Not Currently    Birth control/protection: Surgical    Comment: hyst  Other Topics Concern   Not on file  Social History Narrative   Not on file   Social Determinants of Health   Financial Resource Strain: Not on file  Food Insecurity: Not on file  Transportation Needs: Not on file  Physical Activity: Not on  file  Stress: Not on file  Social Connections: Not on file    Family History  Problem Relation Age of Onset   Lung cancer Father 27   Colon cancer Neg Hx    Esophageal cancer Neg Hx    Rectal cancer Neg Hx    Stomach cancer Neg Hx    Adrenal disorder Neg Hx    Liver disease Neg Hx    Inflammatory bowel disease Neg Hx    Pancreatic cancer Neg Hx     Outpatient Encounter Medications as of 10/17/2022  Medication Sig   ALPRAZolam (XANAX) 1 MG tablet Take 1 mg by mouth 2 (two) times daily as needed.   buPROPion (WELLBUTRIN XL) 150 MG 24 hr tablet Take 150 mg by mouth every morning.   cholestyramine (QUESTRAN) 4 g packet Take 1 packet (4 g total) by mouth 2 (two) times daily.   cromolyn (GASTROCROM) 100 MG/5ML solution Take by mouth as directed.   diphenhydrAMINE (BENADRYL) 25 mg capsule Take 25 mg by mouth every 6 (six) hours as needed for allergies.   EPINEPHrine (EPIPEN IJ) Inject as directed. As needed   famotidine (PEPCID) 40 MG tablet Take 40 mg by mouth daily.   KETOTIFEN FUMARATE OP Take by mouth as needed.   losartan (COZAAR) 25 MG tablet Take 1 tablet (25 mg total) by mouth daily.   metoprolol tartrate (LOPRESSOR) 25 MG tablet Take 0.5 tablets (12.5 mg total) by mouth 2 (two) times  daily as needed (palpitations).   omeprazole (PRILOSEC) 40 MG capsule Take 40 mg by mouth in the morning and at bedtime.   potassium chloride (KLOR-CON) 20 MEQ packet Take 2 packets by mouth daily.   promethazine (PHENERGAN) 25 MG tablet Take 25 mg by mouth every 8 (eight) hours as needed for nausea or vomiting.   TIROSINT 112 MCG CAPS Take 1 capsule (112 mcg total) by mouth daily before breakfast.   [DISCONTINUED] TIROSINT 100 MCG CAPS Take 1 capsule by mouth daily.   Facility-Administered Encounter Medications as of 10/17/2022  Medication   0.9 %  sodium chloride infusion    ALLERGIES: Allergies  Allergen Reactions   Alpha-Gal Anaphylaxis   Cinnamon Swelling and Other (See Comments)    REACTION: throat swells   Bee Venom Swelling and Other (See Comments)    REACTION: All over body swelling   Vicodin [Hydrocodone-Acetaminophen] Nausea And Vomiting   Dilaudid [Hydromorphone Hcl] Hives   Toradol [Ketorolac Tromethamine]     Hives, and chest pain   VACCINATION STATUS: Immunization History  Administered Date(s) Administered   Influenza,inj,Quad PF,6+ Mos 02/28/2016   Moderna Sars-Covid-2 Vaccination 02/08/2020, 03/07/2020     HPI   Gwendolyn Glass is a patient with the above medical history. she was diagnosed with hypothyroidism which required subsequent initiation of thyroid hormone replacement therapy. she was given various doses of Levothyroxine, branded-Synthroid, and Tirosint over the years, currently on Tirosint 100 micrograms. she reports compliance to this medication:  Taking it daily on empty stomach with water, separated by >30 minutes before breakfast and other medications, and by at least 4 hours from calcium, iron, PPIs, multivitamins.  She notes she is taking cholestyramine daily to help with absorption problems.  She describes her disease course as starting out as diarrhea which came after every meal or liquid.  Then she was diagnosed with alpha-gal about 2 years ago  and then, slowly she started to develop other symptoms, dizziness, abdominal cramping, headaches, near syncopal episodes.  She has seen many  different specialists, was being worked up for POTS but wanted to have her thyroid managed first at it may be affecting her other symptoms.  Around 6 weeks ago, she was taking Synthroid 250 mcg po daily before breakfast but then felt terrible, therefore she reduced the dose herself to 100 mcg of Tirosint (which I am assuming she had left over from all the trials she has been on in the past).  She does note of all the different thyroid medications she has tried, Tirosint was the best tolerated.   I reviewed patient's thyroid tests:  Lab Results  Component Value Date   TSH 6.420 (H) 10/08/2022   TSH 17.10 (A) 08/17/2022   TSH 11.70 (A) 05/23/2022   TSH 15.20 (A) 04/10/2022   TSH 18.49 (H) 06/22/2021   TSH 3.56 03/07/2021   FREET4 1.11 10/08/2022   FREET4 0.90 03/07/2021    Pt denies feeling nodules in neck, hoarseness, dysphagia/odynophagia, SOB with lying down.  she does not know of family history of thyroid disorders including cancer.  No history of radiation therapy to head or neck.  No recent use of iodine supplements.  Denies use of Biotin containing supplements.  She has had thyroid ultrasound in August 2022 which was normal with exception of noting cell heterogeneity.  I reviewed her chart and she also has a history of GERD, tobacco abuse, malabsorption issues.   ROS:  Constitutional: no weight gain/loss, + fatigue, no subjective hyperthermia, no subjective hypothermia, chronic headache Eyes: no blurry vision, no xerophthalmia ENT: no sore throat, no nodules palpated in throat, no dysphagia/odynophagia, no hoarseness Cardiovascular: no chest pain, no SOB, + intermittent palpitations, no leg swelling, dizziness with position changes (being worked up for POTS) Respiratory: no cough, no SOB Gastrointestinal: no nausea/vomiting, + chronic  diarrhea Musculoskeletal: no muscle/joint aches, intermittent diffuse muscle spasms Skin: no rashes Neurological: no tremors, no numbness, no tingling, + intermittent dizziness Psychiatric: no depression, no anxiety   Objective:   Objective     BP 122/82 (BP Location: Left Arm, Patient Position: Sitting, Cuff Size: Normal)   Pulse 74   Ht _0  (1.727 m)   Wt 242 lb 3.2 oz (109.9 kg)   LMP 04/11/2017 (Exact Date) Comment: has had continual periods  BMI 36.83 kg/m  Wt Readings from Last 3 Encounters:  10/17/22 242 lb 3.2 oz (109.9 kg)  10/08/22 242 lb 6.4 oz (110 kg)  10/07/22 243 lb 12.8 oz (110.6 kg)    BP Readings from Last 3 Encounters:  10/17/22 122/82  10/08/22 127/86  10/07/22 120/88     Constitutional:  Body mass index is 36.83 kg/m., not in acute distress, normal state of mind Eyes: PERRLA, EOMI, no exophthalmos ENT: moist mucous membranes, mild thyromegaly R>L, no cervical lymphadenopathy Cardiovascular: normal precordial activity, RRR, no murmur/rubs/gallops Respiratory:  adequate breathing efforts, no gross chest deformity, Clear to auscultation bilaterally Gastrointestinal: abdomen soft, non-tender, no distension, bowel sounds present Musculoskeletal: no gross deformities, strength intact in all four extremities Skin: moist, warm, no rashes, nonblanchable purplish area to right arm (pictured below) Neurological: no tremor with outstretched hands, deep tendon reflexes normal in BLE.    CMP ( most recent) CMP     Component Value Date/Time   NA 142 07/26/2022 1115   K 4.2 07/26/2022 1115   CL 105 07/26/2022 1115   CO2 23 07/26/2022 1115   GLUCOSE 80 07/26/2022 1115   GLUCOSE 85 06/22/2021 1125   BUN 8 07/26/2022 1115   CREATININE 0.92 07/26/2022  1115   CALCIUM 9.1 07/26/2022 1115   PROT 7.0 06/22/2021 1125   PROT 6.0 09/28/2020 1443   ALBUMIN 4.2 06/22/2021 1125   AST 13 06/22/2021 1125   ALT 18 06/22/2021 1125   ALKPHOS 83 06/22/2021 1125    BILITOT 0.3 06/22/2021 1125   GFRNONAA >60 11/10/2020 1622   GFRAA >60 09/17/2018 0525     Diabetic Labs (most recent): No results found for: "HGBA1C", "MICROALBUR"   Lipid Panel ( most recent) Lipid Panel  No results found for: "CHOL", "TRIG", "HDL", "CHOLHDL", "VLDL", "LDLCALC", "LDLDIRECT", "LABVLDL"     Lab Results  Component Value Date   TSH 6.420 (H) 10/08/2022   TSH 17.10 (A) 08/17/2022   TSH 11.70 (A) 05/23/2022   TSH 15.20 (A) 04/10/2022   TSH 18.49 (H) 06/22/2021   TSH 3.56 03/07/2021   FREET4 1.11 10/08/2022   FREET4 0.90 03/07/2021      Thyroid US from 07/30/22 US Thyroid  Anatomical Region Laterality Modality  Neck -- Ultrasound   Impression  Thyroid heterogeneity without discrete nodule.  The above is in keeping with the ACR TI-RADS recommendations - J Am Coll Radiol 2017;14:587-595.   Electronically Signed   By: Jerilynn Mages.  Shick M.D.   On: 07/30/2021 13:33 Narrative  CLINICAL DATA:  Palpable left thyroid nodule, neck fullness  EXAM: THYROID ULTRASOUND  TECHNIQUE: Ultrasound examination of the thyroid gland and adjacent soft tissues was performed.  COMPARISON:  None.  FINDINGS: Parenchymal Echotexture: Moderately heterogenous  Isthmus: 3 mm  Right lobe: 4.3 x 1.8 x 1.3 cm  Left lobe: 3.6 x 1.8 x 1.3 cm  _________________________________________________________  Estimated total number of nodules >/= 1 cm: 0  Number of spongiform nodules >/=  2 cm not described below (TR1): 0  Number of mixed cystic and solid nodules >/= 1.5 cm not described below (TR2): 0  _________________________________________________________  Nonspecific thyroid heterogeneity suggesting medical thyroid disease. No hypervascularity or nodule. No adenopathy. Procedure Note  Gasper Sells, MD - 07/30/2021 Formatting of this note might be different from the original. CLINICAL DATA:  Palpable left thyroid nodule, neck fullness  EXAM: THYROID  ULTRASOUND  TECHNIQUE: Ultrasound examination of the thyroid gland and adjacent soft tissues was performed.  COMPARISON:  None.  FINDINGS: Parenchymal Echotexture: Moderately heterogenous  Isthmus: 3 mm  Right lobe: 4.3 x 1.8 x 1.3 cm  Left lobe: 3.6 x 1.8 x 1.3 cm  _________________________________________________________  Estimated total number of nodules >/= 1 cm: 0  Number of spongiform nodules >/=  2 cm not described below (TR1): 0  Number of mixed cystic and solid nodules >/= 1.5 cm not described below (TR2): 0  _________________________________________________________  Nonspecific thyroid heterogeneity suggesting medical thyroid disease. No hypervascularity or nodule. No adenopathy.  IMPRESSION: Thyroid heterogeneity without discrete nodule.  The above is in keeping with the ACR TI-RADS recommendations - J Am Coll Radiol 2017;14:587-595.   Electronically Signed   By: Jerilynn Mages.  Shick M.D.   On: 07/30/2021 13:33 Exam End: 07/30/21 13:32   Specimen Collected: 07/30/21 13:32 Last Resulted: 07/30/21 13:33  Received From: [facility]      Latest Reference Range & Units 03/07/21 14:57 06/22/21 11:25 04/10/22 00:00 05/23/22 00:00 08/17/22 00:00 10/08/22 13:57  TSH 0.450 - 4.500 uIU/mL 3.56 18.49 (H) 15.20 ! (E) 11.70 ! (E) 17.10 ! (E) 6.420 (H)  Triiodothyronine,Free,Serum 2.0 - 4.4 pg/mL      2.5  T4,Free(Direct) 0.82 - 1.77 ng/dL 0.90     1.11  Thyroperoxidase Ab  SerPl-aCnc 0 - 34 IU/mL      160 (H)  Thyroglobulin Antibody 0.0 - 0.9 IU/mL      2.2 (H)  (H): Data is abnormally high !: Data is abnormal (E): External lab result   Assessment & Plan:   ASSESSMENT / PLAN:  1. Hypothyroidism-r/t Hashimotos thyroiditis   Patient with long-standing hypothyroidism, on thyroid hormone replacement therapy. On physical exam, patient does not have gross goiter, thyroid nodules, or neck compression symptoms.  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  Her previsit labs are consistent with slight under-replacement.  She is advised to increase her Tirosint to 112 mcg po daily before breakfast (can take it when gets up in night time to use restroom).  - We discussed about correct intake of levothyroxine, at fasting, with water, separated by at least 30 minutes from breakfast, and separated by more than 4 hours from calcium, iron, multivitamins, acid reflux medications (PPIs). -Patient is made aware of the fact that thyroid hormone replacement is needed for life, dose to be adjusted by periodic monitoring of thyroid function tests.    -Due to absence of clinical goiter and essentially normal [place] from a year ago, no need for additional thyroid ultrasound at this time.  I also entered referral for Rheumatology per patients request to possibly help identify etiology for her myriad of symptoms.   I spent 40 minutes in the care of the patient today including review of labs from Thyroid Function, CMP, and other relevant labs ; imaging/biopsy records (current and previous including abstractions from other facilities); face-to-face time discussing  her lab results and symptoms, medications doses, her options of short and long term treatment based on the latest standards of care / guidelines;   and documenting the encounter.  [name]  participated in the discussions, expressed understanding, and voiced agreement with the above plans.  All questions were answered to her satisfaction. she is encouraged to contact clinic should she have any questions or concerns prior to her return visit.   FOLLOW UP PLAN:  Return in about 2 months (around 12/17/2022).  Rayetta Pigg, [facility] [address] [place], [place] [address] Phone: [phone] Fax: [phone]  10/17/2022, 4:08 PM

## 2022-10-17 NOTE — Patient Instructions (Signed)

## 2022-12-13 LAB — T4, FREE: Free T4: 0.87 ng/dL (ref 0.82–1.77)

## 2022-12-13 LAB — TSH: TSH: 24.3 u[IU]/mL — ABNORMAL HIGH (ref 0.450–4.500)

## 2022-12-13 NOTE — Progress Notes (Signed)
Office Visit Note  Patient: Gwendolyn Glass             Date of Birth: Oct 14, 1981           MRN: [id number]             PCP: Lanelle Bal, PA-C Referring: Brita Romp, NP Visit Date: 12/25/2022 Occupation: '@GUAROCC'$ @  Subjective:  Polyarthralgia and fatigue   History of Present Illness: Gwendolyn Glass is a 42 y.o. female who presents today for a new patient consultation as requested by her PCP.  Patient reports that she was sent for further evaluation of multiple concerns.  Patient reports that she has been having progressively worsening symptoms over the past 2 years.  The symptoms include fatigue, brain fog, palpitations, intermittent rashes, arthralgias, myalgias, muscle spasms, and intermittent symptoms of Raynaud's phenomenon.  She reports that she has been evaluated by several specialist in the past including endocrinology, cardiology, and gastroenterology.  She states she was also recently diagnosed with Hashimoto's thyroiditis.  She states she was also diagnosed with questionable POTS by her cardiologist.  Patient reports that she continues to have chronic diarrhea and underwent a thorough workup including seeing an alpha gal specialist at [facility].  She states that she has made several dietary changes but continues to have persistent diarrhea.  According to the patient she has difficulty taking medications and does not feel that she absorbs her medications due to the frequency and severity of diarrhea. Patient reports that her myalgias, arthralgias, and fatigue interfere with her quality of life.  Patient reports that she has possible family history of rheumatoid arthritis on the maternal side.  She does not know the family history on her father side. Patient reports that she had recent lab work by her PCP at [place] and tested positive for an ANA so she has came to [place] for further evaluation.  Activities of Daily Living:  Patient reports morning stiffness for 1-2 hours.    Patient Reports nocturnal pain.  Difficulty dressing/grooming: Denies Difficulty climbing stairs: Denies Difficulty getting out of chair: Denies Difficulty using hands for taps, buttons, cutlery, and/or writing: Denies  Review of Systems  Constitutional:  Positive for fatigue.  HENT:  Positive for mouth sores and mouth dryness. Negative for nose dryness.   Eyes:  Positive for dryness. Negative for pain and visual disturbance.  Respiratory:  Negative for cough, hemoptysis and difficulty breathing.   Cardiovascular:  Positive for palpitations. Negative for hypertension and swelling in legs/feet.  Gastrointestinal:  Positive for diarrhea. Negative for blood in stool and constipation.  Endocrine: Negative for increased urination.  Genitourinary:  Negative for painful urination and involuntary urination.  Musculoskeletal:  Positive for joint pain, gait problem, joint pain, joint swelling, myalgias, muscle weakness, morning stiffness, muscle tenderness and myalgias.  Skin:  Positive for color change, rash, hair loss and sensitivity to sunlight. Negative for pallor, nodules/bumps, skin tightness and ulcers.  Allergic/Immunologic: Positive for susceptible to infections.  Neurological:  Positive for dizziness and headaches. Negative for numbness and weakness.  Hematological:  Positive for swollen glands.  Psychiatric/Behavioral:  Positive for depressed mood and sleep disturbance. The patient is nervous/anxious.     PMFS History:  Patient Active Problem List   Diagnosis Date Noted   Unintentional weight loss 06/23/2021   Chronic abdominal pain 06/23/2021   Chronic diarrhea 06/23/2021   History of cholecystectomy 06/23/2021   Hypokalemia 03/07/2021   Hypothyroidism 03/07/2021   S/P hysterectomy 09/16/2018   Cellulitis of  right upper extremity    Ovarian mass, right 03/01/2016   RLQ abdominal pain 02/29/2016   Nausea with vomiting 02/29/2016   Diarrhea 02/29/2016   C. difficile diarrhea  02/29/2016   C. difficile colitis 02/29/2016   Morbid obesity ([place]) 02/28/2016   Tobacco abuse 02/28/2016   Owens Shark recluse spider bite 02/27/2016   Ectopic pregnancy, tubal 09/07/2014    Past Medical History:  Diagnosis Date   Allergy    Allergy to alpha-gal    Allergy to alpha-gal    Anxiety    [place]ro[place] recluse spider bite 02/27/2016   C. difficile diarrhea 02/29/2016   Depression    GERD (gastroesophageal reflux disease)    Hypertension    Hypokalemia    Miscarriage    pt. states shes  spotting  and wearing a tampon   Ovarian mass, right 03/01/2016   ? complex cyst   Pregnant    Thyroid disease     Family History  Problem Relation Age of Onset   Lung cancer Father 81   Colon cancer Neg Hx    Esophageal cancer Neg Hx    Rectal cancer Neg Hx    Stomach cancer Neg Hx    Adrenal disorder Neg Hx    Liver disease Neg Hx    Inflammatory bowel disease Neg Hx    Pancreatic cancer Neg Hx    Past Surgical History:  Procedure Laterality Date   ABDOMINAL HYSTERECTOMY N/A 09/16/2018   Procedure: HYSTERECTOMY ABDOMINAL;  Surgeon: Florian Buff, MD;  Location: [facility];  Service: Gynecology;  Laterality: N/A;   APPENDECTOMY     CESAREAN SECTION     CHOLECYSTECTOMY     CHOLECYSTECTOMY  07/28/2012   Procedure: LAPAROSCOPIC CHOLECYSTECTOMY;  Surgeon: Donato Heinz, MD;  Location: [facility];  Service: General;  Laterality: N/A;   LAPAROSCOPY N/A 09/07/2014   Procedure: LAPAROSCOPY OPERATIVE;  Surgeon: Woodroe Mode, MD;  Location: [place] [facility];  Service: Gynecology;  Laterality: N/A;   removal of lt fallopian tube     UNILATERAL SALPINGECTOMY Right 09/07/2014   Procedure: UNILATERAL SALPINGECTOMY;  Surgeon: Woodroe Mode, MD;  Location: [place] [facility];  Service: Gynecology;  Laterality: Right;   WOUND DEBRIDEMENT Right 02/28/2016   Procedure: DEBRIDEMENT OF RIGHT SHOULDER SPIDER BITE;  Surgeon: Aviva Signs, MD;  Location: [facility];  Service: General;  Laterality: Right;   Social History   Social  History Narrative   Not on file   Immunization History  Administered Date(s) Administered   Influenza,inj,Quad PF,6+ Mos 02/28/2016   Moderna Sars-Covid-2 Vaccination 02/08/2020, 03/07/2020     Objective: Vital Signs: BP (!) 144/95 (BP Location: Right Arm, Patient Position: Sitting, Cuff Size: Normal)   Pulse (!) 108   Ht '5\' 8"'$  (1.727 m)   Wt 256 lb (116.1 kg)   LMP 04/11/2017 (Exact Date) Comment: has had continual periods  BMI 38.92 kg/m    Physical Exam Vitals and nursing note reviewed.  Constitutional:      Appearance: She is well-developed.  HENT:     Head: Normocephalic and atraumatic.  Eyes:     Conjunctiva/sclera: Conjunctivae normal.  Cardiovascular:     Rate and Rhythm: Normal rate and regular rhythm.     Heart sounds: Normal heart sounds.  Pulmonary:     Effort: Pulmonary effort is normal.     Breath sounds: Normal breath sounds.  Abdominal:     General: Bowel sounds are normal.     Palpations: Abdomen is soft.  Musculoskeletal:  Cervical back: Normal range of motion.  Skin:    General: Skin is warm and dry.     Capillary Refill: Capillary refill takes less than 2 seconds.  Neurological:     Mental Status: She is alert and oriented to person, place, and time.  Psychiatric:        Behavior: Behavior normal.      Musculoskeletal Exam: C-spine has good range of motion.  Some trapezius muscle tension and tenderness bilaterally.  Shoulder joints have good range of motion with some tenderness especially over the right shoulder.  Elbow joints have good range of motion with no warmth or tenderness along the elbow joint line.  Tenderness over both wrist joints.  Tenderness over several MCP joints.  Complete fist formation bilaterally.  No obvious synovitis noted.  Hip joints have good range of motion with some discomfort bilaterally.  Some tenderness over the trochanteric bursa bilaterally.  Knee joints have good range of motion with discomfort bilaterally.  No  warmth or effusion noted.  Ankle joints have good range of motion with no tenderness or joint swelling.  CDAI Exam: CDAI Score: -- Patient Global: --; Provider Global: -- Swollen: --; Tender: -- Joint Exam 12/25/2022   No joint exam has been documented for this visit   There is currently no information documented on the homunculus. Go to the Rheumatology activity and complete the homunculus joint exam.  Investigation: No additional findings.  Imaging: XR KNEE 3 VIEW LEFT  Result Date: 12/25/2022 No medial or lateral compartment narrowing was noted.  No patellofemoral narrowing was noted.  No chondrocalcinosis was noted. Impression: Unremarkable x-rays of the knee.  XR KNEE 3 VIEW RIGHT  Result Date: 12/25/2022 No medial or lateral compartment narrowing was noted.  No patellofemoral narrowing was noted.  No chondrocalcinosis was noted. Impression: Unremarkable x-rays of the knee.  XR Hand 2 View Right  Result Date: 12/25/2022 [facility] and PIP narrowing was noted.  No MCP, intercarpal or radiocarpal joint space narrowing was noted.  No erosive changes were noted. Impression: These findings were consistent with early degenerative changes of the [facility] and DIP joints.  XR Hand 2 View Left  Result Date: 12/25/2022 [facility] and PIP narrowing was noted.  No MCP, intercarpal or radiocarpal joint space narrowing was noted.  No erosive changes were noted. Impression: These findings were consistent with early degenerative changes of the [facility] and DIP joints.   Recent Labs: Lab Results  Component Value Date   WBC 8.9 06/22/2021   HGB 13.4 06/22/2021   PLT 172.0 06/22/2021   NA 142 07/26/2022   K 4.2 07/26/2022   CL 105 07/26/2022   CO2 23 07/26/2022   GLUCOSE 80 07/26/2022   BUN 8 07/26/2022   CREATININE 0.92 07/26/2022   BILITOT 0.3 06/22/2021   ALKPHOS 83 06/22/2021   AST 13 06/22/2021   ALT 18 06/22/2021   PROT 7.0 06/22/2021   ALBUMIN 4.2 06/22/2021   CALCIUM 9.1 07/26/2022   GFRAA >60  09/17/2018    Speciality Comments: No specialty comments available.  Procedures:  No procedures performed Allergies: Alpha-gal, Cinnamon, Bee venom, Vicodin [hydrocodone-acetaminophen], Dilaudid [hydromorphone hcl], and Toradol [ketorolac tromethamine]     Assessment / Plan:     Visit Diagnoses: Positive ANA (antinuclear antibody) -History of positive ANA-checked by PCP. We will call to obtain these records.  Patient has been experiencing fatigue, brain fog, Raynaud's phenomenon, sicca symptoms, facial rashes, hair thinning, arthralgias, myalgias, and muscle spasms intermittently for the past 2 years.  No known family history of systemic lupus.  Plan to obtain AVISE lab panel along with the following lab work today.  Results will be discussed at NPFU visit.   Plan: CBC with Differential/Platelet, COMPLETE METABOLIC PANEL WITH GFR, Protein / creatinine ratio, urine, C3 and C4, CK, ANCA Screen Reflex Titer(QUEST)  Myalgia -She has been experiencing intermittent myalgias, muscle cramping, muscle spasms for the past 2 years.  She has no muscle weakness on examination today.  She has not tried taking muscle relaxer in the past.  A prescription for methocarbamol 500 mg 1 tablet daily as needed for muscle spasms was sent to the pharmacy today.  Discussed the importance of avoiding taking methocarbamol while driving or operating machinery.  She voiced understanding.  She will notify us if she cannot tolerate taking methocarbamol.  In the meantime the following lab work will be obtained today for further evaluation.  Plan: CK, Serum protein electrophoresis with reflex  Polyarthralgia -She presents today with generalized arthralgias and myalgias.  Her pain has been most severe in both hands and both knee joints.  She has not tried any over-the-counter products for pain relief.  She has not tried ice or heat to alleviate her symptoms.  She notices intermittent swelling in her hands.  On examination she has  tenderness over both wrist joints and several MCP joints but no synovitis was noted.  She was able to make a complete fist bilaterally.  Both knee joints have good range of motion with no warmth or effusion.  X-rays of both hands and feet were obtained today.  The following lab work was also obtained for further evaluation. AVISE panel was also ordered.    Plan: Sedimentation rate, VITAMIN D 25 Hydroxy (Vit-D Deficiency, Fractures), CK, C-reactive protein, Serum protein electrophoresis with reflex  Pain in both hands - She presents today with pain and stiffness in both hands and both wrist joints.  She has no synovitis on examination today.  She has tenderness over both wrist joints and several MCP joints.  X-rays of both hands were obtained today which revealed early osteoarthritic changes.  No erosive changes were noted.  The following lab work will be obtained today for further evaluation along with an AVISE panel.  She may require an ultrasound of the hands to assess for synovitis in the future.  plan: Sedimentation rate, XR Hand 2 View Left, XR Hand 2 View Right, C-reactive protein  Chronic pain of both knees - She presents today with pain and stiffness in both knee joints.  Her arthralgias and joint stiffness have progressively been worsening over the past 2 years.  She has been experiencing myalgias and muscle spasms in her legs as well.  X-rays of both knees were obtained for further evaluation as well as CRP, sed rate, and CK.  Plan: XR KNEE 3 VIEW RIGHT, XR KNEE 3 VIEW LEFT, C-reactive protein  Vitamin D deficiency -Vitamin D level will be checked today.  She is currently experiencing myalgias, fatigue, and brain fog.  Plan: VITAMIN D 25 Hydroxy (Vit-D Deficiency, Fractures)  Brain fog: She experiences fatigue and brain fog intermittently which has been interfering with her quality of life.  AVISE panel ordered.   Other fatigue - She experiences fatigue on a daily basis.  The following lab work  will be obtained today for further evaluation.  Plan: CBC with Differential/Platelet, COMPLETE METABOLIC PANEL WITH GFR, Serum protein electrophoresis with reflex  Muscle spasms of both lower extremities - She experiences intermittent muscle  spasms in her upper and lower extremities. No muscle weakness.  CK will be checked today. Plan: VITAMIN D 25 Hydroxy (Vit-D Deficiency, Fractures), CK  Rash and other nonspecific skin eruption - She experiences intermittent facial rashes and has occasional photosensitivity.  In the past she has had discoloration on her right upper arm following a vein which concerned her.  The following lab work will be obtained today along with AVISE panel for further evaluation.  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  Under care of endocrinologist-Whitney Marinus Maw, NP.  Reviewed office visit note from 12/17/2022.  Concern for malabsorption due to chronic diarrhea and no response to medications including levothyroxine.   POTS (postural orthostatic tachycardia syndrome): Evaluated by cardiology.    Chronic diarrhea: Patient's endocrinologist placed a new referral to GI for second opinion due to ongoing diarrhea and concern for malabsorption.  History of cholecystectomy  S/P hysterectomy  Tobacco abuse  C. difficile colitis     Orders: Orders Placed This Encounter  Procedures   XR Hand 2 View Left   XR Hand 2 View Right   XR KNEE 3 VIEW RIGHT   XR KNEE 3 VIEW LEFT   CBC with Differential/Platelet   COMPLETE METABOLIC PANEL WITH GFR   Protein / creatinine ratio, urine   Sedimentation rate   C3 and C4   VITAMIN D 25 Hydroxy (Vit-D Deficiency, Fractures)   CK   C-reactive protein   ANCA Screen Reflex Titer(QUEST)   Serum protein electrophoresis with reflex    Meds ordered this encounter  Medications   methocarbamol (ROBAXIN) 500 MG tablet    Sig: Take 1 tablet (500 mg total) by mouth daily as needed for muscle spasms.    Dispense:  15 tablet    Refill:  0      Follow-Up Instructions: Return for NPFU.   Ofilia Neas, PA-C  Note - This record has been created using Dragon software.  Chart creation errors have been sought, but may not always  have been located. Such creation errors do not reflect on  the standard of medical care.

## 2022-12-16 NOTE — Patient Instructions (Signed)

## 2022-12-17 ENCOUNTER — Ambulatory Visit: Payer: BC Managed Care – PPO | Admitting: Nurse Practitioner

## 2022-12-17 ENCOUNTER — Encounter: Payer: Self-pay | Admitting: Nurse Practitioner

## 2022-12-17 VITALS — BP 135/86 | HR 90 | Ht 68.0 in | Wt 257.2 lb

## 2022-12-17 DIAGNOSIS — K909 Intestinal malabsorption, unspecified: Secondary | ICD-10-CM | POA: Diagnosis not present

## 2022-12-17 DIAGNOSIS — K529 Noninfective gastroenteritis and colitis, unspecified: Secondary | ICD-10-CM

## 2022-12-17 DIAGNOSIS — E063 Autoimmune thyroiditis: Secondary | ICD-10-CM

## 2022-12-17 DIAGNOSIS — E038 Other specified hypothyroidism: Secondary | ICD-10-CM

## 2022-12-17 MED ORDER — LEVOTHYROXINE SODIUM 112 MCG/ML PO SOLN: 112.0000 ug | Freq: Every day | ORAL | 3 refills | Status: DC

## 2022-12-17 NOTE — Progress Notes (Signed)
Endocrinology Consult Note                                         12/17/2022, 2:41 PM  Subjective:   Subjective    Gwendolyn Glass is a 42 y.o.-year-old female patient being seen in consultation for hypothyroidism referred by Lanelle Bal, PA-C.   Past Medical History:  Diagnosis Date   Allergy    Allergy to alpha-gal    Allergy to alpha-gal    Anxiety    Mothershead recluse spider bite 02/27/2016   C. difficile diarrhea 02/29/2016   Depression    GERD (gastroesophageal reflux disease)    Hypertension    Hypokalemia    Miscarriage    pt. states shes  spotting  and wearing a tampon   Ovarian mass, right 03/01/2016   ? complex cyst   Pregnant    Thyroid disease     Past Surgical History:  Procedure Laterality Date   ABDOMINAL HYSTERECTOMY N/A 09/16/2018   Procedure: HYSTERECTOMY ABDOMINAL;  Surgeon: Florian Buff, MD;  Location: [facility];  Service: Gynecology;  Laterality: N/A;   APPENDECTOMY     CESAREAN SECTION     CHOLECYSTECTOMY     CHOLECYSTECTOMY  07/28/2012   Procedure: LAPAROSCOPIC CHOLECYSTECTOMY;  Surgeon: Donato Heinz, MD;  Location: [facility];  Service: General;  Laterality: N/A;   LAPAROSCOPY N/A 09/07/2014   Procedure: LAPAROSCOPY OPERATIVE;  Surgeon: Woodroe Mode, MD;  Location: [facility];  Service: Gynecology;  Laterality: N/A;   removal of lt fallopian tube     UNILATERAL SALPINGECTOMY Right 09/07/2014   Procedure: UNILATERAL SALPINGECTOMY;  Surgeon: Woodroe Mode, MD;  Location: [facility];  Service: Gynecology;  Laterality: Right;   WOUND DEBRIDEMENT Right 02/28/2016   Procedure: DEBRIDEMENT OF RIGHT SHOULDER SPIDER BITE;  Surgeon: Aviva Signs, MD;  Location: [facility];  Service: General;  Laterality: Right;    Social History   Socioeconomic History   Marital status: Married    Spouse name: Not on file   Number of children: Not on file   Years of education: Not on file   Highest education  level: Not on file  Occupational History   Not on file  Tobacco Use   Smoking status: Former    Packs/day: 0.50    Years: 1.00    Total pack years: 0.50    Types: Cigarettes    Passive exposure: Never   Smokeless tobacco: Never  Vaping Use   Vaping Use: Never used  Substance and Sexual Activity   Alcohol use: Not Currently    Comment: occ.   Drug use: No   Sexual activity: Not Currently    Birth control/protection: Surgical    Comment: hyst  Other Topics Concern   Not on file  Social History Narrative   Not on file   Social Determinants of Health   Financial Resource Strain: Not on file  Food Insecurity: Not on file  Transportation Needs: Not on file  Physical Activity: Not on  file  Stress: Not on file  Social Connections: Not on file    Family History  Problem Relation Age of Onset   Lung cancer Father 45   Colon cancer Neg Hx    Esophageal cancer Neg Hx    Rectal cancer Neg Hx    Stomach cancer Neg Hx    Adrenal disorder Neg Hx    Liver disease Neg Hx    Inflammatory bowel disease Neg Hx    Pancreatic cancer Neg Hx     Outpatient Encounter Medications as of 12/17/2022  Medication Sig   ALPRAZolam (XANAX) 1 MG tablet Take 1 mg by mouth 2 (two) times daily as needed.   buPROPion (WELLBUTRIN XL) 150 MG 24 hr tablet Take 150 mg by mouth every morning.   cholestyramine (QUESTRAN) 4 g packet Take 1 packet (4 g total) by mouth 2 (two) times daily.   cromolyn (GASTROCROM) 100 MG/5ML solution Take by mouth as directed.   diphenhydrAMINE (BENADRYL) 25 mg capsule Take 25 mg by mouth every 6 (six) hours as needed for allergies.   EPINEPHrine (EPIPEN IJ) Inject as directed. As needed   famotidine (PEPCID) 40 MG tablet Take 40 mg by mouth daily.   KETOTIFEN FUMARATE OP Take by mouth as needed.   Levothyroxine Sodium 112 MCG/ML SOLN Take 112 mcg by mouth daily before breakfast.   losartan (COZAAR) 25 MG tablet Take 1 tablet (25 mg total) by mouth daily.   metoprolol  tartrate (LOPRESSOR) 25 MG tablet Take 0.5 tablets (12.5 mg total) by mouth 2 (two) times daily as needed (palpitations).   omeprazole (PRILOSEC) 40 MG capsule Take 40 mg by mouth in the morning and at bedtime.   potassium chloride (KLOR-CON) 20 MEQ packet Take 2 packets by mouth daily.   promethazine (PHENERGAN) 25 MG tablet Take 25 mg by mouth every 8 (eight) hours as needed for nausea or vomiting.   [DISCONTINUED] TIROSINT 112 MCG CAPS Take 1 capsule (112 mcg total) by mouth daily before breakfast.   Facility-Administered Encounter Medications as of 12/17/2022  Medication   0.9 %  sodium chloride infusion    ALLERGIES: Allergies  Allergen Reactions   Alpha-Gal Anaphylaxis   Cinnamon Swelling and Other (See Comments)    REACTION: throat swells   Bee Venom Swelling and Other (See Comments)    REACTION: All over body swelling   Vicodin [Hydrocodone-Acetaminophen] Nausea And Vomiting   Dilaudid [Hydromorphone Hcl] Hives   Toradol [Ketorolac Tromethamine]     Hives, and chest pain   VACCINATION STATUS: Immunization History  Administered Date(s) Administered   Influenza,inj,Quad PF,6+ Mos 02/28/2016   Moderna Sars-Covid-2 Vaccination 02/08/2020, 03/07/2020     HPI   Gwendolyn Glass is a patient with the above medical history. she was diagnosed with hypothyroidism which required subsequent initiation of thyroid hormone replacement therapy. she was given various doses of Levothyroxine, branded-Synthroid, and Tirosint over the years, currently on Tirosint 100 micrograms. she reports compliance to this medication:  Taking it daily on empty stomach with water, separated by >30 minutes before breakfast and other medications, and by at least 4 hours from calcium, iron, PPIs, multivitamins.  She notes she is taking cholestyramine daily to help with absorption problems.  She describes her disease course as starting out as diarrhea which came after every meal or liquid.  Then she was diagnosed  with alpha-gal about 2 years ago and then, slowly she started to develop other symptoms, dizziness, abdominal cramping, headaches, near syncopal episodes.  She  has seen many different specialists, was being worked up for POTS but wanted to have her thyroid managed first at it may be affecting her other symptoms.  Around 6 weeks ago, she was taking Synthroid 250 mcg po daily before breakfast but then felt terrible, therefore she reduced the dose herself to 100 mcg of Tirosint (which I am assuming she had left over from all the trials she has been on in the past).  She does note of all the different thyroid medications she has tried, Tirosint was the best tolerated.   I reviewed patient's thyroid tests:  Lab Results  Component Value Date   TSH 24.300 (H) 12/12/2022   TSH 6.420 (H) 10/08/2022   TSH 17.10 (A) 08/17/2022   TSH 11.70 (A) 05/23/2022   TSH 15.20 (A) 04/10/2022   TSH 18.49 (H) 06/22/2021   TSH 3.56 03/07/2021   FREET4 0.87 12/12/2022   FREET4 1.11 10/08/2022   FREET4 0.90 03/07/2021    Pt denies feeling nodules in neck, hoarseness, dysphagia/odynophagia, SOB with lying down.  she does not know of family history of thyroid disorders including cancer.  No history of radiation therapy to head or neck.  No recent use of iodine supplements.  Denies use of Biotin containing supplements.  She has had thyroid ultrasound in August 2022 which was normal with exception of noting cell heterogeneity.  I reviewed her chart and she also has a history of GERD, tobacco abuse, malabsorption issues.   ROS:  Constitutional: + weight gain, + fatigue, no subjective hyperthermia, no subjective hypothermia, chronic headache Eyes: no blurry vision, no xerophthalmia ENT: no sore throat, no nodules palpated in throat, no dysphagia/odynophagia, no hoarseness Cardiovascular: no chest pain, no SOB, + intermittent palpitations, no leg swelling, dizziness with position changes (being worked up for  POTS) Respiratory: no cough, no SOB Gastrointestinal: no nausea/vomiting, + chronic diarrhea triggered by any oral intake Musculoskeletal: no muscle/joint aches, intermittent diffuse muscle spasms Skin: no rashes Neurological: no tremors, no numbness, no tingling, + intermittent dizziness Psychiatric: no depression, no anxiety   Objective:   Objective     BP 135/86 (BP Location: Left Arm, Patient Position: Sitting, Cuff Size: Normal)   Pulse 90   Ht '5\' 8"'$  (1.727 m)   Wt 257 lb 3.2 oz (116.7 kg)   LMP 04/11/2017 (Exact Date) Comment: has had continual periods  BMI 39.11 kg/m  Wt Readings from Last 3 Encounters:  12/17/22 257 lb 3.2 oz (116.7 kg)  10/17/22 242 lb 3.2 oz (109.9 kg)  10/08/22 242 lb 6.4 oz (110 kg)    BP Readings from Last 3 Encounters:  12/17/22 135/86  10/17/22 122/82  10/08/22 127/86      Physical Exam- Limited  Constitutional:  Body mass index is 39.11 kg/m. , not in acute distress, normal state of mind Eyes:  EOMI, no exophthalmos Musculoskeletal: no gross deformities, strength intact in all four extremities, no gross restriction of joint movements Skin:  no rashes, no hyperemia Neurological: no tremor with outstretched hands    CMP ( most recent) CMP     Component Value Date/Time   NA 142 07/26/2022 1115   K 4.2 07/26/2022 1115   CL 105 07/26/2022 1115   CO2 23 07/26/2022 1115   GLUCOSE 80 07/26/2022 1115   GLUCOSE 85 06/22/2021 1125   BUN 8 07/26/2022 1115   CREATININE 0.92 07/26/2022 1115   CALCIUM 9.1 07/26/2022 1115   PROT 7.0 06/22/2021 1125   PROT 6.0 09/28/2020 1443  ALBUMIN 4.2 06/22/2021 1125   AST 13 06/22/2021 1125   ALT 18 06/22/2021 1125   ALKPHOS 83 06/22/2021 1125   BILITOT 0.3 06/22/2021 1125   GFRNONAA >60 11/10/2020 1622   GFRAA >60 09/17/2018 0525     Diabetic Labs (most recent): No results found for: "HGBA1C", "MICROALBUR"   Lipid Panel ( most recent) Lipid Panel  No results found for: "CHOL", "TRIG",  "HDL", "CHOLHDL", "VLDL", "LDLCALC", "LDLDIRECT", "LABVLDL"     Lab Results  Component Value Date   TSH 24.300 (H) 12/12/2022   TSH 6.420 (H) 10/08/2022   TSH 17.10 (A) 08/17/2022   TSH 11.70 (A) 05/23/2022   TSH 15.20 (A) 04/10/2022   TSH 18.49 (H) 06/22/2021   TSH 3.56 03/07/2021   FREET4 0.87 12/12/2022   FREET4 1.11 10/08/2022   FREET4 0.90 03/07/2021      Thyroid US from 07/30/22 US Thyroid  Anatomical Region Laterality Modality  Neck -- Ultrasound   Impression  Thyroid heterogeneity without discrete nodule.  The above is in keeping with the ACR TI-RADS recommendations - J Am Coll Radiol 2017;14:587-595.   Electronically Signed   By: Jerilynn Mages.  Shick M.D.   On: 07/30/2021 13:33 Narrative  CLINICAL DATA:  Palpable left thyroid nodule, neck fullness  EXAM: THYROID ULTRASOUND  TECHNIQUE: Ultrasound examination of the thyroid gland and adjacent soft tissues was performed.  COMPARISON:  None.  FINDINGS: Parenchymal Echotexture: Moderately heterogenous  Isthmus: 3 mm  Right lobe: 4.3 x 1.8 x 1.3 cm  Left lobe: 3.6 x 1.8 x 1.3 cm  _________________________________________________________  Estimated total number of nodules >/= 1 cm: 0  Number of spongiform nodules >/=  2 cm not described below (TR1): 0  Number of mixed cystic and solid nodules >/= 1.5 cm not described below (TR2): 0  _________________________________________________________  Nonspecific thyroid heterogeneity suggesting medical thyroid disease. No hypervascularity or nodule. No adenopathy. Procedure Note  Gasper Sells, MD - 07/30/2021 Formatting of this note might be different from the original. CLINICAL DATA:  Palpable left thyroid nodule, neck fullness  EXAM: THYROID ULTRASOUND  TECHNIQUE: Ultrasound examination of the thyroid gland and adjacent soft tissues was performed.  COMPARISON:  None.  FINDINGS: Parenchymal Echotexture: Moderately heterogenous  Isthmus: 3  mm  Right lobe: 4.3 x 1.8 x 1.3 cm  Left lobe: 3.6 x 1.8 x 1.3 cm  _________________________________________________________  Estimated total number of nodules >/= 1 cm: 0  Number of spongiform nodules >/=  2 cm not described below (TR1): 0  Number of mixed cystic and solid nodules >/= 1.5 cm not described below (TR2): 0  _________________________________________________________  Nonspecific thyroid heterogeneity suggesting medical thyroid disease. No hypervascularity or nodule. No adenopathy.  IMPRESSION: Thyroid heterogeneity without discrete nodule.  The above is in keeping with the ACR TI-RADS recommendations - J Am Coll Radiol 2017;14:587-595.   Electronically Signed   By: Jerilynn Mages.  Shick M.D.   On: 07/30/2021 13:33 Exam End: 07/30/21 13:32   Specimen Collected: 07/30/21 13:32 Last Resulted: 07/30/21 13:33  Received From: [facility]      Latest Reference Range & Units 03/07/21 14:57 06/22/21 11:25 04/10/22 00:00 05/23/22 00:00 08/17/22 00:00 10/08/22 13:57  TSH 0.450 - 4.500 uIU/mL 3.56 18.49 (H) 15.20 ! (E) 11.70 ! (E) 17.10 ! (E) 6.420 (H)  Triiodothyronine,Free,Serum 2.0 - 4.4 pg/mL      2.5  T4,Free(Direct) 0.82 - 1.77 ng/dL 0.90     1.11  Thyroperoxidase Ab SerPl-aCnc 0 - 34 IU/mL  160 (H)  Thyroglobulin Antibody 0.0 - 0.9 IU/mL      2.2 (H)  (H): Data is abnormally high !: Data is abnormal (E): External lab result   Assessment & Plan:   ASSESSMENT / PLAN:  1. Hypothyroidism-r/t Hashimotos thyroiditis   Patient with long-standing hypothyroidism, on thyroid hormone replacement therapy. On physical exam, patient does not have gross goiter, thyroid nodules, or neck compression symptoms.  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  She will be hard to get control of until the clear etiology for her chronic  diarrhea is identified.  Will try switching her to oral solution to see if she can absorb it better, will stay at 112 mcg po daily before breakfast.  - We discussed about correct intake of levothyroxine, at fasting, with water, separated by at least 30 minutes from breakfast, and separated by more than 4 hours from calcium, iron, multivitamins, acid reflux medications (PPIs). -Patient is made aware of the fact that thyroid hormone replacement is needed for life, dose to be adjusted by periodic monitoring of thyroid function tests.    -Due to absence of clinical goiter and essentially normal [place] from a year ago, no need for additional thyroid ultrasound at this time.  She has her Rheumatology appointment coming up.  I entered referral to GI today for second opinion for her chronic diarrhea, malabsorption syndrome affecting her overall health.  She has been on Creon in the past which did not help, ruling out EPI as potential endocrine cause.    I spent 44 minutes in the care of the patient today including review of labs from Thyroid Function, CMP, and other relevant labs ; imaging/biopsy records (current and previous including abstractions from other facilities); face-to-face time discussing  her lab results and symptoms, medications doses, her options of short and long term treatment based on the latest standards of care / guidelines;   and documenting the encounter.  [name]  participated in the discussions, expressed understanding, and voiced agreement with the above plans.  All questions were answered to her satisfaction. she is encouraged to contact clinic should she have any questions or concerns prior to her return visit.   FOLLOW UP PLAN:  Return in about 2 months (around 02/15/2023) for Thyroid follow up, Previsit labs.  Rayetta Pigg, [facility] [address] [place], [place] [address] Phone: [phone] Fax: [phone]  12/17/2022,  2:41 PM

## 2022-12-25 ENCOUNTER — Encounter: Payer: Self-pay | Admitting: Physician Assistant

## 2022-12-25 ENCOUNTER — Ambulatory Visit (INDEPENDENT_AMBULATORY_CARE_PROVIDER_SITE_OTHER): Payer: BC Managed Care – PPO

## 2022-12-25 ENCOUNTER — Ambulatory Visit: Payer: BC Managed Care – PPO

## 2022-12-25 ENCOUNTER — Ambulatory Visit: Payer: BC Managed Care – PPO | Attending: Physician Assistant | Admitting: Physician Assistant

## 2022-12-25 VITALS — BP 144/95 | HR 108 | Ht 68.0 in | Wt 256.0 lb

## 2022-12-25 DIAGNOSIS — E038 Other specified hypothyroidism: Secondary | ICD-10-CM | POA: Diagnosis not present

## 2022-12-25 DIAGNOSIS — M25562 Pain in left knee: Secondary | ICD-10-CM

## 2022-12-25 DIAGNOSIS — M62838 Other muscle spasm: Secondary | ICD-10-CM | POA: Diagnosis not present

## 2022-12-25 DIAGNOSIS — M25561 Pain in right knee: Secondary | ICD-10-CM | POA: Diagnosis not present

## 2022-12-25 DIAGNOSIS — G90A Postural orthostatic tachycardia syndrome (POTS): Secondary | ICD-10-CM | POA: Diagnosis not present

## 2022-12-25 DIAGNOSIS — G8929 Other chronic pain: Secondary | ICD-10-CM | POA: Diagnosis not present

## 2022-12-25 DIAGNOSIS — M2550 Pain in unspecified joint: Secondary | ICD-10-CM

## 2022-12-25 DIAGNOSIS — M79642 Pain in left hand: Secondary | ICD-10-CM

## 2022-12-25 DIAGNOSIS — R21 Rash and other nonspecific skin eruption: Secondary | ICD-10-CM

## 2022-12-25 DIAGNOSIS — R768 Other specified abnormal immunological findings in serum: Secondary | ICD-10-CM

## 2022-12-25 DIAGNOSIS — Z90710 Acquired absence of both cervix and uterus: Secondary | ICD-10-CM

## 2022-12-25 DIAGNOSIS — M79641 Pain in right hand: Secondary | ICD-10-CM

## 2022-12-25 DIAGNOSIS — A0472 Enterocolitis due to Clostridium difficile, not specified as recurrent: Secondary | ICD-10-CM

## 2022-12-25 DIAGNOSIS — R5383 Other fatigue: Secondary | ICD-10-CM

## 2022-12-25 DIAGNOSIS — Z720 Tobacco use: Secondary | ICD-10-CM

## 2022-12-25 DIAGNOSIS — E063 Autoimmune thyroiditis: Secondary | ICD-10-CM

## 2022-12-25 DIAGNOSIS — M791 Myalgia, unspecified site: Secondary | ICD-10-CM

## 2022-12-25 DIAGNOSIS — Z9049 Acquired absence of other specified parts of digestive tract: Secondary | ICD-10-CM

## 2022-12-25 DIAGNOSIS — K529 Noninfective gastroenteritis and colitis, unspecified: Secondary | ICD-10-CM

## 2022-12-25 DIAGNOSIS — E559 Vitamin D deficiency, unspecified: Secondary | ICD-10-CM

## 2022-12-25 DIAGNOSIS — R4189 Other symptoms and signs involving cognitive functions and awareness: Secondary | ICD-10-CM

## 2022-12-25 MED ORDER — METHOCARBAMOL 500 MG PO TABS: 500.0000 mg | ORAL_TABLET | Freq: Every day | ORAL | 0 refills | Status: DC | PRN

## 2022-12-26 NOTE — Progress Notes (Signed)
Vitamin D is very low.  Could be contributing to her fatigue and myalgias. Please notify the patient and send in vitamin D 50,000 units twice weekly x3 months.

## 2022-12-27 ENCOUNTER — Other Ambulatory Visit: Payer: Self-pay

## 2022-12-27 DIAGNOSIS — E559 Vitamin D deficiency, unspecified: Secondary | ICD-10-CM

## 2022-12-27 MED ORDER — VITAMIN D (ERGOCALCIFEROL) 1.25 MG (50000 UNIT) PO CAPS: 50000.0000 [IU] | ORAL_CAPSULE | ORAL | 0 refills | Status: DC

## 2022-12-27 NOTE — Progress Notes (Unsigned)
Lab note on 12/25/2022: Vitamin D is very low.  Could be contributing to her fatigue and myalgias. Please notify the patient and send in vitamin D 50,000 units twice weekly x3 months.   Please review and sign pended vitamin d rx and future lab order. Thanks!

## 2022-12-30 LAB — CBC WITH DIFFERENTIAL/PLATELET
Absolute Monocytes: 419 cells/uL (ref 200–950)
Basophils Absolute: 32 cells/uL (ref 0–200)
Basophils Relative: 0.4 %
Eosinophils Absolute: 79 cells/uL (ref 15–500)
Eosinophils Relative: 1 %
HCT: 39.7 % (ref 35.0–45.0)
Hemoglobin: 13.3 g/dL (ref 11.7–15.5)
Lymphs Abs: 1785 cells/uL (ref 850–3900)
MCH: 28.5 pg (ref 27.0–33.0)
MCHC: 33.5 g/dL (ref 32.0–36.0)
MCV: 85.2 fL (ref 80.0–100.0)
MPV: 11.8 fL (ref 7.5–12.5)
Monocytes Relative: 5.3 %
Neutro Abs: 5585 cells/uL (ref 1500–7800)
Neutrophils Relative %: 70.7 %
Platelets: 151 10*3/uL (ref 140–400)
RBC: 4.66 10*6/uL (ref 3.80–5.10)
RDW: 12.8 % (ref 11.0–15.0)
Total Lymphocyte: 22.6 %
WBC: 7.9 10*3/uL (ref 3.8–10.8)

## 2022-12-30 LAB — PROTEIN / CREATININE RATIO, URINE
Creatinine, Urine: 72 mg/dL (ref 20–275)
Protein/Creat Ratio: 56 mg/g creat (ref 24–184)
Protein/Creatinine Ratio: 0.056 mg/mg creat (ref 0.024–0.184)
Total Protein, Urine: 4 mg/dL — ABNORMAL LOW (ref 5–24)

## 2022-12-30 LAB — COMPLETE METABOLIC PANEL WITH GFR
AG Ratio: 1.7 (calc) (ref 1.0–2.5)
ALT: 40 U/L — ABNORMAL HIGH (ref 6–29)
AST: 26 U/L (ref 10–30)
Albumin: 4.3 g/dL (ref 3.6–5.1)
Alkaline phosphatase (APISO): 80 U/L (ref 31–125)
BUN: 8 mg/dL (ref 7–25)
CO2: 26 mmol/L (ref 20–32)
Calcium: 9.2 mg/dL (ref 8.6–10.2)
Chloride: 107 mmol/L (ref 98–110)
Creat: 0.83 mg/dL (ref 0.50–0.97)
Globulin: 2.5 g/dL (calc) (ref 1.9–3.7)
Glucose, Bld: 83 mg/dL (ref 65–99)
Potassium: 4 mmol/L (ref 3.5–5.3)
Sodium: 142 mmol/L (ref 135–146)
Total Bilirubin: 0.4 mg/dL (ref 0.2–1.2)
Total Protein: 6.8 g/dL (ref 6.1–8.1)
eGFR: 92 mL/min/{1.73_m2} (ref >=60)

## 2022-12-30 LAB — PROTEIN ELECTROPHORESIS, SERUM, WITH REFLEX
Albumin ELP: 4 g/dL (ref 3.8–4.8)
Alpha 1: 0.3 g/dL (ref 0.2–0.3)
Alpha 2: 0.6 g/dL (ref 0.5–0.9)
Beta 2: 0.4 g/dL (ref 0.2–0.5)
Beta Globulin: 0.5 g/dL (ref 0.4–0.6)
Gamma Globulin: 0.9 g/dL (ref 0.8–1.7)
Total Protein: 6.6 g/dL (ref 6.1–8.1)

## 2022-12-30 LAB — VITAMIN D 25 HYDROXY (VIT D DEFICIENCY, FRACTURES): Vit D, 25-Hydroxy: 14 ng/mL — ABNORMAL LOW (ref 30–100)

## 2022-12-30 LAB — SEDIMENTATION RATE: Sed Rate: 6 mm/h (ref 0–20)

## 2022-12-30 LAB — CK: Total CK: 178 U/L — ABNORMAL HIGH (ref 29–143)

## 2022-12-30 LAB — ANCA SCREEN W REFLEX TITER: ANCA SCREEN: NEGATIVE

## 2022-12-30 LAB — C3 AND C4
C3 Complement: 168 mg/dL (ref 83–193)
C4 Complement: 29 mg/dL (ref 15–57)

## 2022-12-30 LAB — C-REACTIVE PROTEIN: CRP: 5.2 mg/L (ref <8.0)

## 2022-12-30 NOTE — Progress Notes (Signed)
CK is borderline elevated. Aldolase will be added at her follow up visit.   The rest of the lab results will be discussed in detail at her NPFU visit.

## 2023-01-09 NOTE — Progress Notes (Signed)
Office Visit Note  Patient: Gwendolyn Glass             Date of Birth: 08/19/1981           MRN: [id number]             PCP: Lanelle Bal, PA-C Referring: Lanelle Bal, PA-C Visit Date: 01/22/2023 Occupation: @GUAROCC$ @  Subjective:  Discuss lab results   History of Present Illness: Gwendolyn Glass is a 42 y.o. female with history of polyarthralgia, myalgia, and Hashimoto's thyroiditis.  Patient presents today to review x-ray and lab findings from her initial office visit on 12/25/2022.  Patient reports that she continues to have myalgias, muscle cramps, muscle spasms intermittently.  She states that some days are better than others.  She states that on good days she tends to overdo which then leads to several bad days consecutively.  Patient reports that along with the myalgias she continues to have significant fatigue.  She has been taking vitamin D 50,000 units twice weekly as recommended since her vitamin D was low at 14 on 12/25/2022.  She has not noticed any difference in her symptoms since initiating vitamin D.  Patient reports in the past she has tried taking magnesium but has difficulty taking over-the-counter magnesium due to chronic diarrhea.  She has not yet had an appointment with GI for further evaluation.  Her next endocrinology visit is scheduled on 02/17/2023.   Activities of Daily Living:  Patient reports morning stiffness for 1 hour.   Patient Reports nocturnal pain.  Difficulty dressing/grooming: Denies Difficulty climbing stairs: Denies Difficulty getting out of chair: Denies Difficulty using hands for taps, buttons, cutlery, and/or writing: Denies  Review of Systems  Constitutional:  Positive for fatigue.  HENT:  Positive for mouth sores and mouth dryness.   Eyes:  Positive for dryness.  Respiratory:  Positive for shortness of breath.   Cardiovascular:  Positive for chest pain and palpitations.  Gastrointestinal:  Positive for diarrhea. Negative for blood in stool  and constipation.  Endocrine: Negative.  Negative for increased urination.  Genitourinary: Negative.  Negative for involuntary urination.  Musculoskeletal:  Positive for joint pain, joint pain, joint swelling, myalgias, muscle weakness, morning stiffness, muscle tenderness and myalgias. Negative for gait problem.  Skin:  Positive for rash, hair loss and sensitivity to sunlight. Negative for color change.  Allergic/Immunologic: Positive for susceptible to infections.  Neurological:  Positive for dizziness and headaches.  Hematological: Negative.  Negative for swollen glands.  Psychiatric/Behavioral:  Positive for depressed mood and sleep disturbance. The patient is nervous/anxious.     PMFS History:  Patient Active Problem List   Diagnosis Date Noted   Unintentional weight loss 06/23/2021   Chronic abdominal pain 06/23/2021   Chronic diarrhea 06/23/2021   History of cholecystectomy 06/23/2021   Hypokalemia 03/07/2021   Hypothyroidism 03/07/2021   S/P hysterectomy 09/16/2018   Cellulitis of right upper extremity    Ovarian mass, right 03/01/2016   RLQ abdominal pain 02/29/2016   Nausea with vomiting 02/29/2016   Diarrhea 02/29/2016   C. difficile diarrhea 02/29/2016   C. difficile colitis 02/29/2016   Morbid obesity ([place]) 02/28/2016   Tobacco abuse 02/28/2016   [place] recluse spider bite 02/27/2016   Ectopic pregnancy, tubal 09/07/2014    Past Medical History:  Diagnosis Date   Allergy    Allergy to alpha-gal    Allergy to alpha-gal    Anxiety    [place] recluse spider bite 02/27/2016   C. difficile diarrhea 02/29/2016  Depression    GERD (gastroesophageal reflux disease)    Hypertension    Hypokalemia    Miscarriage    pt. states shes  spotting  and wearing a tampon   Ovarian mass, right 03/01/2016   ? complex cyst   Pregnant    Thyroid disease     Family History  Problem Relation Age of Onset   Lung cancer Father 33   Colon cancer Neg Hx    Esophageal cancer Neg  Hx    Rectal cancer Neg Hx    Stomach cancer Neg Hx    Adrenal disorder Neg Hx    Liver disease Neg Hx    Inflammatory bowel disease Neg Hx    Pancreatic cancer Neg Hx    Past Surgical History:  Procedure Laterality Date   ABDOMINAL HYSTERECTOMY N/A 09/16/2018   Procedure: HYSTERECTOMY ABDOMINAL;  Surgeon: Florian Buff, MD;  Location: [facility];  Service: Gynecology;  Laterality: N/A;   APPENDECTOMY     CESAREAN SECTION     CHOLECYSTECTOMY     CHOLECYSTECTOMY  07/28/2012   Procedure: LAPAROSCOPIC CHOLECYSTECTOMY;  Surgeon: Donato Heinz, MD;  Location: [facility];  Service: General;  Laterality: N/A;   LAPAROSCOPY N/A 09/07/2014   Procedure: LAPAROSCOPY OPERATIVE;  Surgeon: Woodroe Mode, MD;  Location: [facility];  Service: Gynecology;  Laterality: N/A;   removal of lt fallopian tube     UNILATERAL SALPINGECTOMY Right 09/07/2014   Procedure: UNILATERAL SALPINGECTOMY;  Surgeon: Woodroe Mode, MD;  Location: [facility];  Service: Gynecology;  Laterality: Right;   WOUND DEBRIDEMENT Right 02/28/2016   Procedure: DEBRIDEMENT OF RIGHT SHOULDER SPIDER BITE;  Surgeon: Aviva Signs, MD;  Location: [facility];  Service: General;  Laterality: Right;   Social History   Social History Narrative   Not on file   Immunization History  Administered Date(s) Administered   Influenza,inj,Quad PF,6+ Mos 02/28/2016   Moderna Sars-Covid-2 Vaccination 02/08/2020, 03/07/2020     Objective: Vital Signs: BP 120/80 (BP Location: Left Arm, Patient Position: Sitting, Cuff Size: Large)   Pulse 97   Resp 16   Ht 5' 8"$  (1.727 m)   Wt 261 lb 6.4 oz (118.6 kg)   LMP 04/11/2017 (Exact Date) Comment: has had continual periods  BMI 39.75 kg/m    Physical Exam Vitals and nursing note reviewed.  Constitutional:      Appearance: She is well-developed.  HENT:     Head: Normocephalic and atraumatic.  Eyes:     Conjunctiva/sclera: Conjunctivae normal.  Cardiovascular:     Rate and Rhythm: Normal rate and regular rhythm.      Heart sounds: Normal heart sounds.  Pulmonary:     Effort: Pulmonary effort is normal.     Breath sounds: Normal breath sounds.  Abdominal:     General: Bowel sounds are normal.     Palpations: Abdomen is soft.  Musculoskeletal:     Cervical back: Normal range of motion.  Skin:    General: Skin is warm and dry.     Capillary Refill: Capillary refill takes less than 2 seconds.  Neurological:     Mental Status: She is alert and oriented to person, place, and time.  Psychiatric:        Behavior: Behavior normal.      Musculoskeletal Exam: C-spine has good range of motion.  Some trapezius muscle tension and tenderness bilaterally.  Shoulder joints have good range of motion with some crepitus in the left shoulder.  Elbow joints have  good range of motion with no tenderness or inflammation.  Wrist joints have good range of motion with no tenderness or synovitis.  Mild DIP prominence noted.  Complete fist formation bilaterally.  Hip joints have good range of motion with no groin pain.  Tenderness over bilateral trochanteric bursa.  Knee joints have good range of motion with no warmth or effusion.  Ankle joints have good range of motion with no tenderness or joint swelling.  CDAI Exam: CDAI Score: -- Patient Global: --; Provider Global: -- Swollen: --; Tender: -- Joint Exam 01/22/2023   No joint exam has been documented for this visit   There is currently no information documented on the homunculus. Go to the Rheumatology activity and complete the homunculus joint exam.  Investigation: No additional findings.  Imaging: XR KNEE 3 VIEW LEFT  Result Date: 12/25/2022 No medial or lateral compartment narrowing was noted.  No patellofemoral narrowing was noted.  No chondrocalcinosis was noted. Impression: Unremarkable x-rays of the knee.  XR KNEE 3 VIEW RIGHT  Result Date: 12/25/2022 No medial or lateral compartment narrowing was noted.  No patellofemoral narrowing was noted.  No  chondrocalcinosis was noted. Impression: Unremarkable x-rays of the knee.  XR Hand 2 View Right  Result Date: 12/25/2022 [facility] and PIP narrowing was noted.  No MCP, intercarpal or radiocarpal joint space narrowing was noted.  No erosive changes were noted. Impression: These findings were consistent with early degenerative changes of the [facility] and DIP joints.  XR Hand 2 View Left  Result Date: 12/25/2022 [facility] and PIP narrowing was noted.  No MCP, intercarpal or radiocarpal joint space narrowing was noted.  No erosive changes were noted. Impression: These findings were consistent with early degenerative changes of the [facility] and DIP joints.   Recent Labs: Lab Results  Component Value Date   WBC 7.9 12/25/2022   HGB 13.3 12/25/2022   PLT 151 12/25/2022   NA 142 12/25/2022   K 4.0 12/25/2022   CL 107 12/25/2022   CO2 26 12/25/2022   GLUCOSE 83 12/25/2022   BUN 8 12/25/2022   CREATININE 0.83 12/25/2022   BILITOT 0.4 12/25/2022   ALKPHOS 83 06/22/2021   AST 26 12/25/2022   ALT 40 (H) 12/25/2022   PROT 6.8 12/25/2022   PROT 6.6 12/25/2022   ALBUMIN 4.2 06/22/2021   CALCIUM 9.2 12/25/2022   GFRAA >60 09/17/2018    Speciality Comments: No specialty comments available.  Procedures:  No procedures performed Allergies: Alpha-gal, Cinnamon, Bee venom, Vicodin [hydrocodone-acetaminophen], Benadryl [diphenhydramine], Dilaudid [hydromorphone hcl], and Toradol [ketorolac tromethamine]     Assessment / Plan:     Visit Diagnoses: Polyarthralgia: No synovitis was noted on examination today.  Reviewed x-ray results consistent with early osteoarthritic changes and x-rays of both knees which were unremarkable.  AVISE 12/25/22: Index negative -1.7.  Antithyroglobulin positive, antithyroid peroxidase antibody positive, beta-2 glycoprotein IgM equivocal. Recommend repeating beta-2 glycoprotein antibodies in 3 months.  Future order will be placed today. [facility] lab results from 12/25/22 were reviewed today in  the office: SPEP-no abnormal protein bands, ANCA negative, CRP WNL, CK 178, vitamin D 14, complements WNL, ESR WNL, no proteinuria.   All questions were addressed.  Plan on checking aldolase and myositis panel to complete the workup.  She has no muscular weakness on examination today but continues to have generalized myalgias and frequent muscle spasms.  In the past she has tried taking magnesium but it worsened diarrhea.  At her initial office visit a prescription for methocarbamol  500 mg 1 tablet daily as needed for muscle spasms was sent to the pharmacy but she has not yet tried it. She was started on vitamin D 50,000 units twice weekly x 3 months due to significant vitamin D deficiency with recent labs.  She has not noticed any improvement in her symptoms since initiating the prescription strength vitamin D.  Patient has difficulty taking capsule supplements and medications due to history of alpha gal.  Patient was given a replesta sample today in the office which she can try taking instead.  Patient continues to experience myalgias, muscle spasms, and fatigue on a daily basis.  As discussed I feel that most of her symptoms are secondary to uncontrolled Hashimoto's thyroiditis. May consider referral to [facility] in the future.  We will call the patient with lab results.  She will follow-up in the office in 6 weeks or sooner if needed.  Elevated CK - CK 178 on 12/25/22.  The following lab work will be obtained today for further evaluation.  Plan: Aldolase, Magnesium, Myositis Specific II Antibodies Panel  Abnormal laboratory test -Beta-2 glycoprotein IgM equivocal on 12/25/22.  Future order for beta-2 glycoprotein antibodies placed today-plan to recheck in 3 months.  plan: Beta-2 glycoprotein antibodies  Brain fog: fatigue and myalgias-likely due to hashimoto's.   Pain in both hands: RF and anti-CCP negative on 12/25/2022.  ESR and CRP within normal limits on 12/25/2022.  X-rays of  both hands were consistent with early osteoarthritic changes on 12/25/2022.  X-ray images and findings were discussed with the patient today in detail.  On examination she has very mild DIP prominence but no synovitis was noted.  She was able to make a complete fist bilaterally.  Chronic pain of both knees: She has good range of motion of both knee joints on examination today.  No warmth or effusion noted.  X-rays of both knees were unremarkable on 12/25/2022.  Other fatigue: She continues to have chronic fatigue.  Her fatigue is likely secondary to uncontrolled Hashimoto's thyroiditis.  Patient is scheduled for an upcoming appointment with her endocrinologist.  Myalgia: She continues to have myalgias and muscle tenderness on a daily basis.  She has been experiencing muscle spasms intermittently.  She has not yet tried a prescription for methocarbamol.  In the past she had intolerance to magnesium which worsened her daily diarrhea.  CK wa 178 on 12/25/2022.  Plan to check aldolase and myositis panel today to complete the workup.  Discussed that her myalgias may be secondary to Hashimoto's thyroiditis which remains uncontrolled.   May consider referral to [place] in the future.    Hypothyroidism due to Hashimoto's thyroiditis: Thyroid peroxidase antibody positive and thyroglobulin antibody positive on 12/25/2022.  Under care of endocrinology.  According to the patient she previously could not tolerate taking Synthroid.  There is concern for possible malabsorption so she has been referred to GI for further evaluation.  Vitamin D deficiency: Vitamin D was low 14 on 12/25/2022.  A prescription for vitamin D 50,000 units twice weekly was sent to the pharmacy.  She has been experiencing excessive sweating after taking the vitamin D supplement.  According to the patient she has difficulty taking medications with capsules given her history of alpha gal.  Patient was given 2 samples of Replesta 50,000 units which she  can take twice weekly.  If she tolerates Replesta we can try sending in a prescription for Reclast to instead of the vitamin D capsules.  Plan to recheck vitamin  D in 3 months.  Future order for vitamin D remains in place.  Chronic diarrhea: Longstanding history of diarrhea.  Previous patient at [facility] gastroenterology.  Patient continues to have loose stools and diarrhea on a daily basis.  Her endocrinologist put in a referral for a second opinion for further evaluation but she has not yet been seen.  Muscle spasms of both lower extremities: A prescription for methocarbamol 500 mg 1 tablet daily as needed for muscle spasms was sent to the pharmacy.  She has not had tried taking methocarbamol.  History of cholecystectomy  S/P hysterectomy  Tobacco abuse  C. difficile colitis  POTS (postural orthostatic tachycardia syndrome)  Orders: Orders Placed This Encounter  Procedures   Aldolase   Beta-2 glycoprotein antibodies   Magnesium   Myositis Specific II Antibodies Panel   No orders of the defined types were placed in this encounter.     Follow-Up Instructions: Return in about 6 weeks (around 03/05/2023).   Ofilia Neas, PA-C  Note - This record has been created using Dragon software.  Chart creation errors have been sought, but may not always  have been located. Such creation errors do not reflect on  the standard of medical care.

## 2023-01-22 ENCOUNTER — Encounter: Payer: Self-pay | Admitting: Physician Assistant

## 2023-01-22 ENCOUNTER — Ambulatory Visit: Payer: BC Managed Care – PPO | Attending: Physician Assistant | Admitting: Physician Assistant

## 2023-01-22 VITALS — BP 120/80 | HR 97 | Resp 16 | Ht 68.0 in | Wt 261.4 lb

## 2023-01-22 DIAGNOSIS — R4189 Other symptoms and signs involving cognitive functions and awareness: Secondary | ICD-10-CM

## 2023-01-22 DIAGNOSIS — Z90710 Acquired absence of both cervix and uterus: Secondary | ICD-10-CM

## 2023-01-22 DIAGNOSIS — M2550 Pain in unspecified joint: Secondary | ICD-10-CM | POA: Diagnosis not present

## 2023-01-22 DIAGNOSIS — M25562 Pain in left knee: Secondary | ICD-10-CM

## 2023-01-22 DIAGNOSIS — M25561 Pain in right knee: Secondary | ICD-10-CM | POA: Diagnosis not present

## 2023-01-22 DIAGNOSIS — E038 Other specified hypothyroidism: Secondary | ICD-10-CM

## 2023-01-22 DIAGNOSIS — E063 Autoimmune thyroiditis: Secondary | ICD-10-CM

## 2023-01-22 DIAGNOSIS — A0472 Enterocolitis due to Clostridium difficile, not specified as recurrent: Secondary | ICD-10-CM

## 2023-01-22 DIAGNOSIS — M62838 Other muscle spasm: Secondary | ICD-10-CM

## 2023-01-22 DIAGNOSIS — M79641 Pain in right hand: Secondary | ICD-10-CM

## 2023-01-22 DIAGNOSIS — G8929 Other chronic pain: Secondary | ICD-10-CM

## 2023-01-22 DIAGNOSIS — M791 Myalgia, unspecified site: Secondary | ICD-10-CM

## 2023-01-22 DIAGNOSIS — Z720 Tobacco use: Secondary | ICD-10-CM

## 2023-01-22 DIAGNOSIS — Z9049 Acquired absence of other specified parts of digestive tract: Secondary | ICD-10-CM

## 2023-01-22 DIAGNOSIS — M79642 Pain in left hand: Secondary | ICD-10-CM

## 2023-01-22 DIAGNOSIS — R899 Unspecified abnormal finding in specimens from other organs, systems and tissues: Secondary | ICD-10-CM

## 2023-01-22 DIAGNOSIS — R5383 Other fatigue: Secondary | ICD-10-CM

## 2023-01-22 DIAGNOSIS — G90A Postural orthostatic tachycardia syndrome (POTS): Secondary | ICD-10-CM

## 2023-01-22 DIAGNOSIS — E559 Vitamin D deficiency, unspecified: Secondary | ICD-10-CM

## 2023-01-22 DIAGNOSIS — K529 Noninfective gastroenteritis and colitis, unspecified: Secondary | ICD-10-CM

## 2023-01-22 DIAGNOSIS — R748 Abnormal levels of other serum enzymes: Secondary | ICD-10-CM

## 2023-01-23 NOTE — Progress Notes (Signed)
Magnesium is WNL.

## 2023-01-31 ENCOUNTER — Encounter: Payer: Self-pay | Admitting: Rheumatology

## 2023-02-01 LAB — MYOSITIS SPECIFIC II ANTIBODIES PANEL
EJ AB: <11 SI (ref <11)
JO-1 AB: <11 SI (ref <11)
MDA-5 AB: <11 SI (ref <11)
MI-2 ALPHA AB: <11 SI (ref <11)
MI-2 BETA AB: <11 SI (ref <11)
NXP-2 AB: <11 SI (ref <11)
OJ AB: <11 SI (ref <11)
PL-12 AB: <11 SI (ref <11)
PL-7 AB: <11 SI (ref <11)
SRP-AB: <11 SI (ref <11)
TIF-1y AB: <11 SI (ref <11)

## 2023-02-01 LAB — MAGNESIUM: Magnesium: 2 mg/dL (ref 1.5–2.5)

## 2023-02-01 LAB — ALDOLASE: Aldolase: 8.4 U/L — ABNORMAL HIGH (ref <=8.1)

## 2023-02-02 NOTE — Progress Notes (Signed)
CK and aldolase are mildly elevated.  May consider referral to neurology for evaluation.  Myositis panel is negative and magnesium is normal.

## 2023-02-03 ENCOUNTER — Other Ambulatory Visit: Payer: Self-pay | Admitting: *Deleted

## 2023-02-03 DIAGNOSIS — R748 Abnormal levels of other serum enzymes: Secondary | ICD-10-CM

## 2023-02-03 DIAGNOSIS — R899 Unspecified abnormal finding in specimens from other organs, systems and tissues: Secondary | ICD-10-CM

## 2023-02-12 LAB — TSH: TSH: 10.3 u[IU]/mL — ABNORMAL HIGH (ref 0.450–4.500)

## 2023-02-12 LAB — T4, FREE: Free T4: 0.9 ng/dL (ref 0.82–1.77)

## 2023-02-14 IMAGING — CT CT ABD-PELV W/ CM
2 of 4 series · 16 of 46 positions shown, 18 images · IV contrast (OMNIPAQUE 300)
Comparison: CT scan 02/29/2016

CLINICAL DATA: Chronic diarrhea and abdominal pain.

EXAM:
CT ABDOMEN AND PELVIS WITH CONTRAST
TECHNIQUE: Multidetector CT imaging of the abdomen and pelvis was performed
using the standard protocol following bolus administration of
intravenous contrast.
CONTRAST:  100mL OMNIPAQUE IOHEXOL 300 MG/ML  SOLN

[Series 2: abd/pel w · axial · 0.81mm/px · z∈[-518,-78]mm · 13 of 96 slices shown, 15 images]
[im 4/96  soft-tissue]
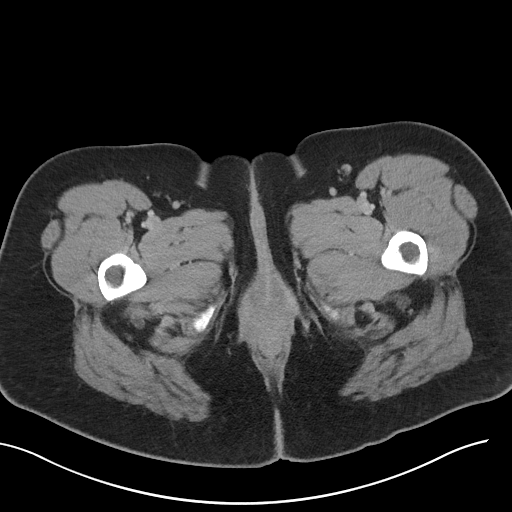
[im 4/96  bone]
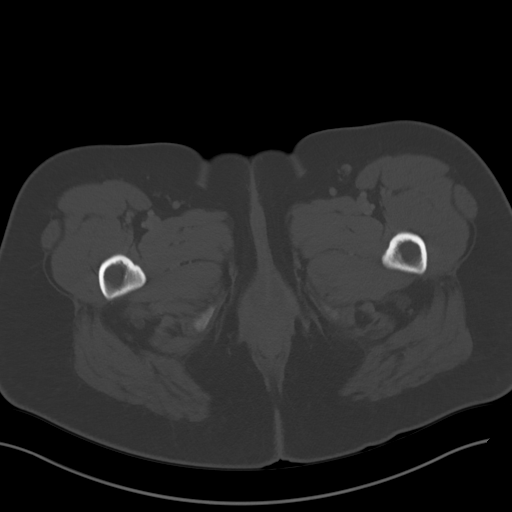
[im 12/96  soft-tissue]
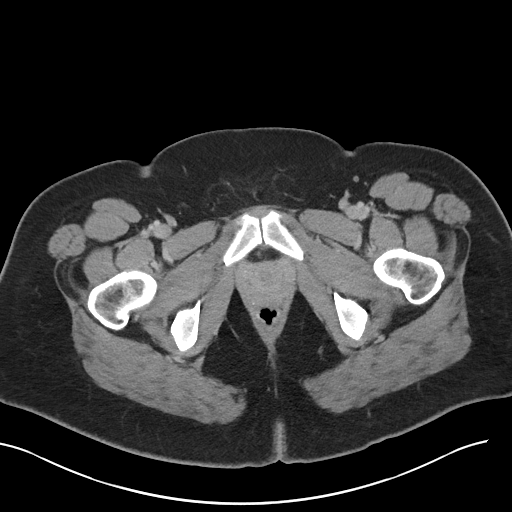
[im 20/96  soft-tissue]
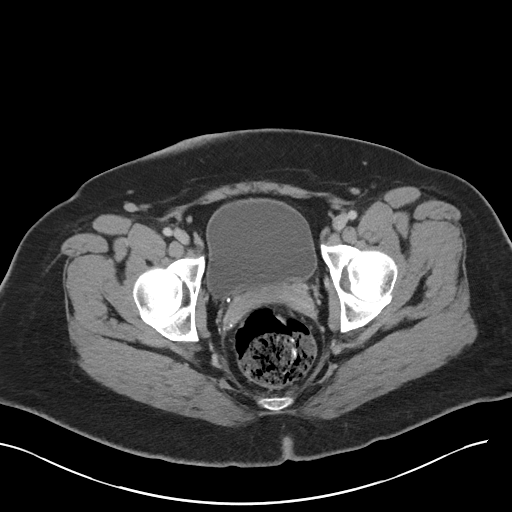
[im 28/96  soft-tissue]
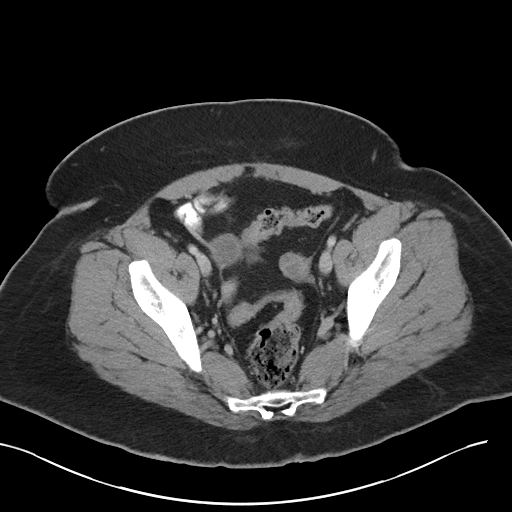
[im 32/96  soft-tissue]
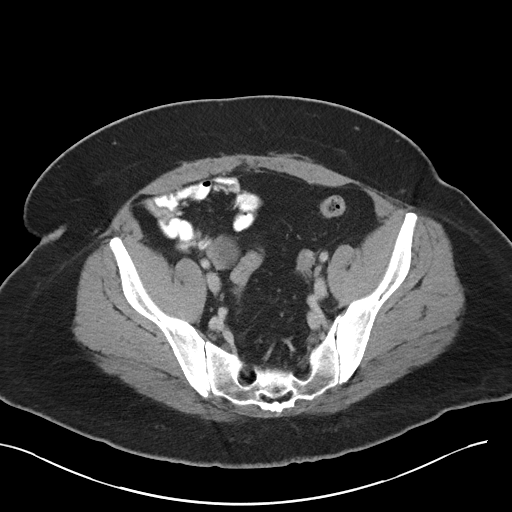
[im 40/96  soft-tissue]
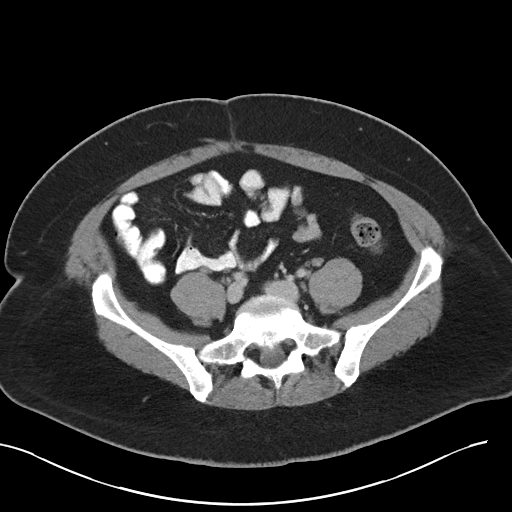
[im 48/96  soft-tissue]
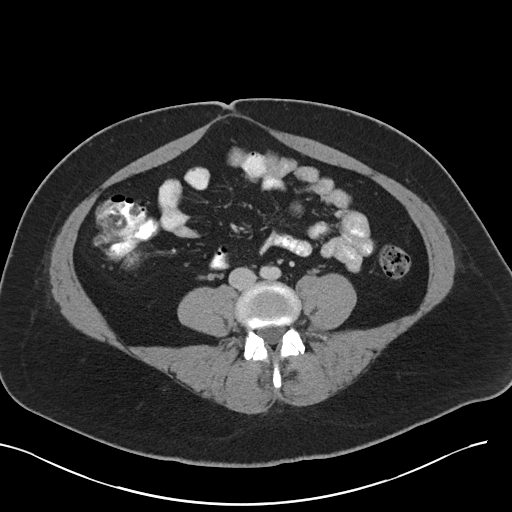
[im 56/96  soft-tissue]
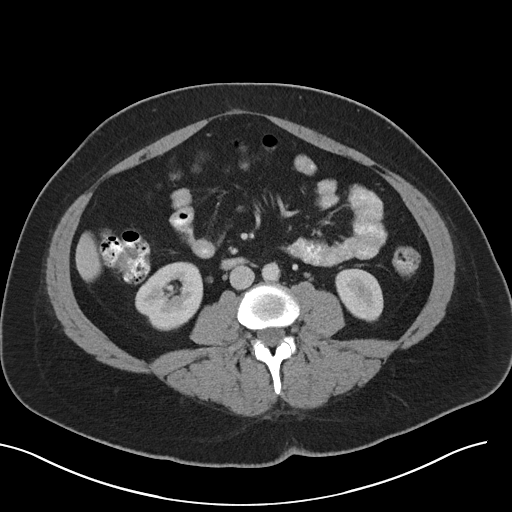
[im 64/96  soft-tissue]
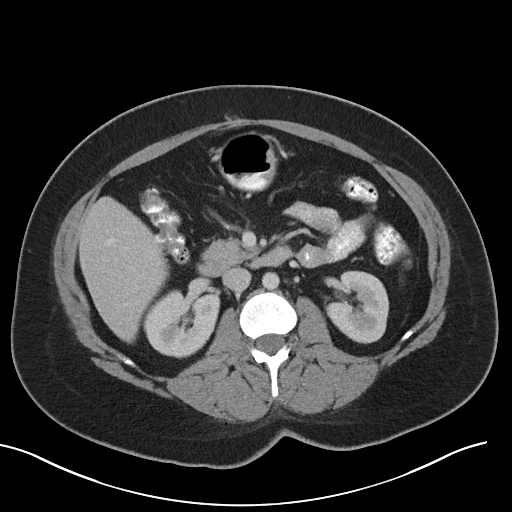
[im 64/96  bone]
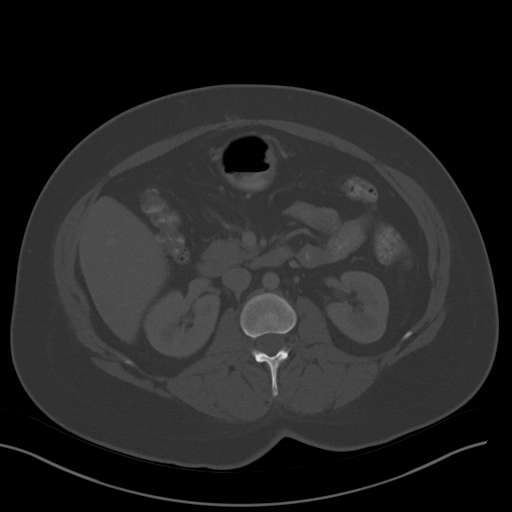
[im 68/96  soft-tissue]
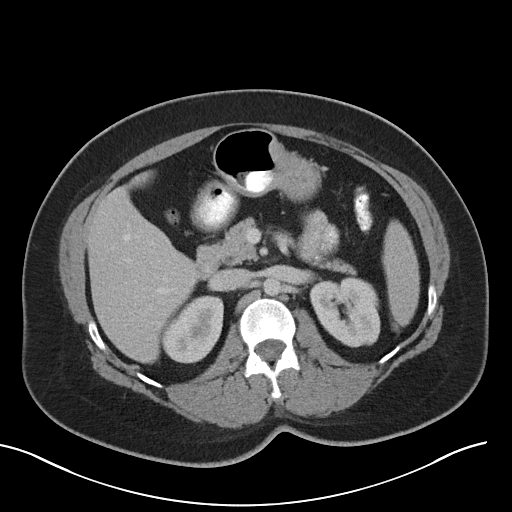
[im 76/96  soft-tissue]
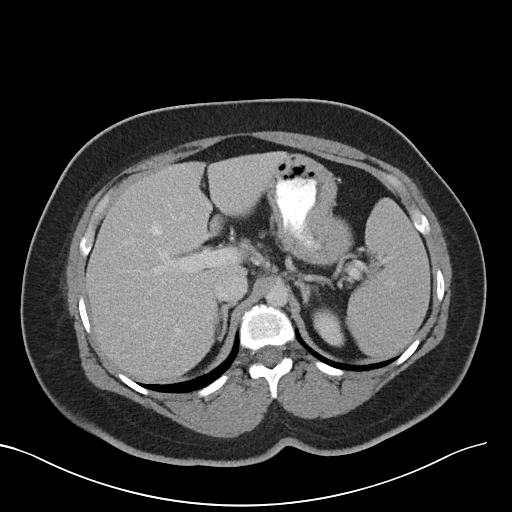
[im 84/96  soft-tissue]
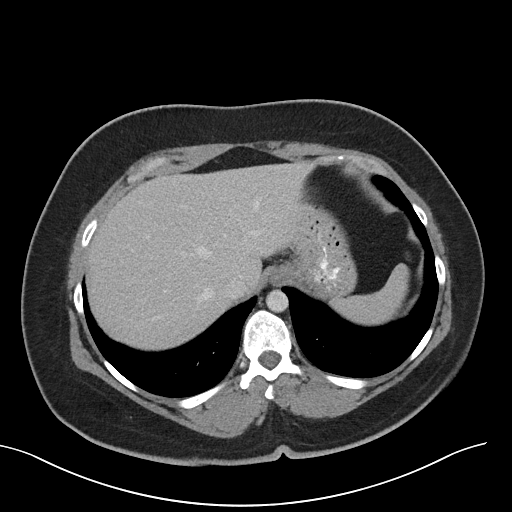
[im 92/96  soft-tissue]
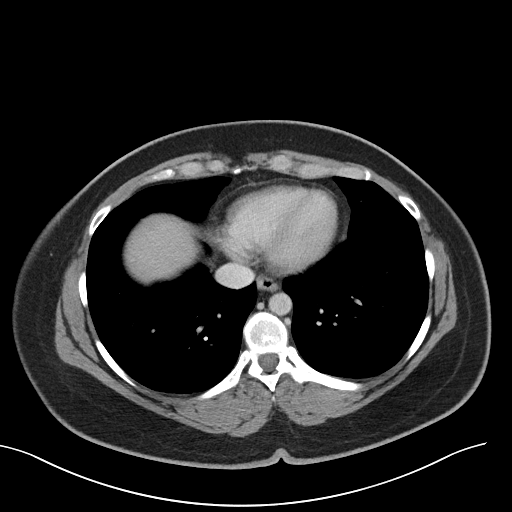

[Series 5: coronal st · coronal · 0.71mm/px · 3 of 89 slices shown]
[im 30/89  soft-tissue]
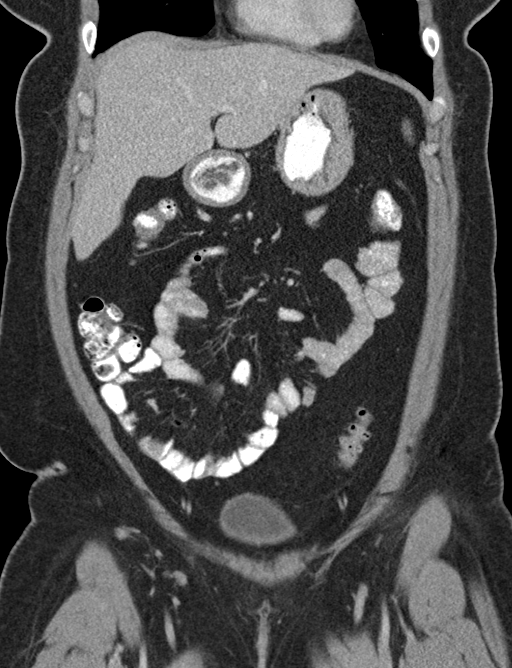
[im 40/89  soft-tissue]
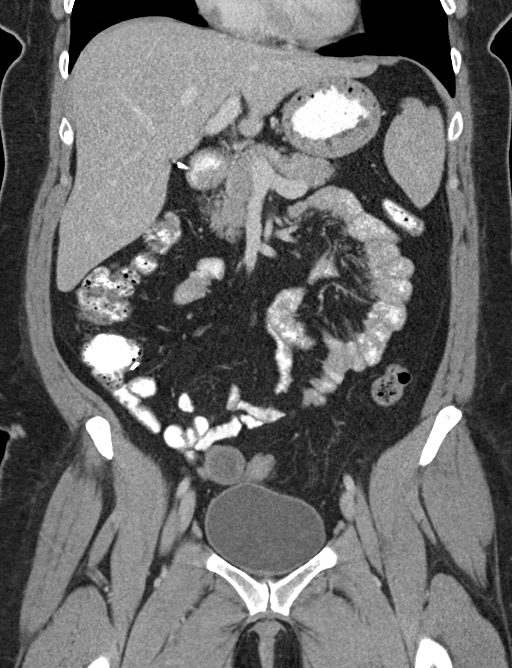
[im 49/89  soft-tissue]
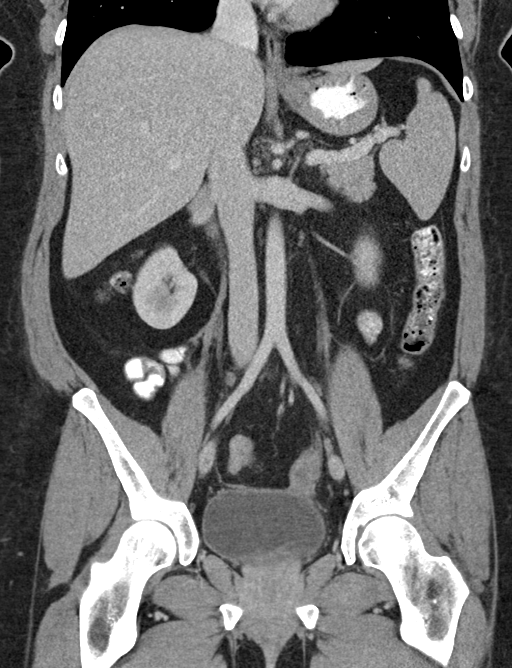

[16 of 46 positions shown; findings below may reference images not displayed]

FINDINGS: Lower chest: Insert lung bases

Hepatobiliary: No hepatic lesions or intrahepatic biliary
dilatation. The gallbladder is surgically absent. No common bile
duct dilatation.

Pancreas: No mass, inflammation or ductal dilatation.

Spleen: Within normal limits in size. No lesions.

Adrenals/Urinary Tract: Adrenal glands kidneys are unremarkable. No
renal, ureteral or bladder calculi or mass.

Stomach/Bowel: The stomach, duodenum, small bowel and colon are
unremarkable. No acute inflammatory changes, mass lesions or
obstructive findings. The terminal is normal. The appendix is
surgically absent.

Vascular/Lymphatic: The aorta is normal in caliber. No dissection.
The branch vessels are patent. The major venous structures are
patent. No mesenteric or retroperitoneal mass or adenopathy. Small
scattered lymph nodes are noted.

Reproductive: The uterus is surgically absent. Both ovaries are
still present and are unremarkable.

Other: No pelvic mass or adenopathy. No free pelvic fluid
collections. No inguinal mass or adenopathy. No abdominal wall
hernia or subcutaneous lesions.

Musculoskeletal: No significant bony findings.
IMPRESSION: 1. No acute abdominal/pelvic findings, mass lesions or adenopathy.
2. Status post cholecystectomy and hysterectomy. Both ovaries are
still present and are unremarkable.

## 2023-02-14 NOTE — Patient Instructions (Signed)

## 2023-02-17 ENCOUNTER — Encounter: Payer: Self-pay | Admitting: Nurse Practitioner

## 2023-02-17 ENCOUNTER — Ambulatory Visit (INDEPENDENT_AMBULATORY_CARE_PROVIDER_SITE_OTHER): Payer: BC Managed Care – PPO | Admitting: Nurse Practitioner

## 2023-02-17 VITALS — BP 129/86 | HR 102 | Ht 68.0 in | Wt 270.0 lb

## 2023-02-17 DIAGNOSIS — K529 Noninfective gastroenteritis and colitis, unspecified: Secondary | ICD-10-CM

## 2023-02-17 DIAGNOSIS — K909 Intestinal malabsorption, unspecified: Secondary | ICD-10-CM | POA: Diagnosis not present

## 2023-02-17 DIAGNOSIS — E038 Other specified hypothyroidism: Secondary | ICD-10-CM | POA: Diagnosis not present

## 2023-02-17 DIAGNOSIS — E063 Autoimmune thyroiditis: Secondary | ICD-10-CM

## 2023-02-17 MED ORDER — LEVOTHYROXINE SODIUM 137 MCG/ML PO SOLN: 137.0000 ug | Freq: Every day | ORAL | 3 refills | Status: DC

## 2023-02-17 NOTE — Progress Notes (Signed)
Endocrinology Follow Up Note                                         02/17/2023, 4:12 PM  Subjective:   Subjective    Gwendolyn Glass is a 42 y.o.-year-old female patient being seen in follow up after being seen in consultation for hypothyroidism referred by Lanelle Bal, PA-C.   Past Medical History:  Diagnosis Date   Allergy    Allergy to alpha-gal    Allergy to alpha-gal    Anxiety    [place] recluse spider bite 02/27/2016   C. difficile diarrhea 02/29/2016   Depression    GERD (gastroesophageal reflux disease)    Hypertension    Hypokalemia    Miscarriage    pt. states shes  spotting  and wearing a tampon   Ovarian mass, right 03/01/2016   ? complex cyst   Pregnant    Thyroid disease     Past Surgical History:  Procedure Laterality Date   ABDOMINAL HYSTERECTOMY N/A 09/16/2018   Procedure: HYSTERECTOMY ABDOMINAL;  Surgeon: Florian Buff, MD;  Location: [facility];  Service: Gynecology;  Laterality: N/A;   APPENDECTOMY     CESAREAN SECTION     CHOLECYSTECTOMY     CHOLECYSTECTOMY  07/28/2012   Procedure: LAPAROSCOPIC CHOLECYSTECTOMY;  Surgeon: Donato Heinz, MD;  Location: [facility];  Service: General;  Laterality: N/A;   LAPAROSCOPY N/A 09/07/2014   Procedure: LAPAROSCOPY OPERATIVE;  Surgeon: Woodroe Mode, MD;  Location: [facility];  Service: Gynecology;  Laterality: N/A;   removal of lt fallopian tube     UNILATERAL SALPINGECTOMY Right 09/07/2014   Procedure: UNILATERAL SALPINGECTOMY;  Surgeon: Woodroe Mode, MD;  Location: [facility];  Service: Gynecology;  Laterality: Right;   WOUND DEBRIDEMENT Right 02/28/2016   Procedure: DEBRIDEMENT OF RIGHT SHOULDER SPIDER BITE;  Surgeon: Aviva Signs, MD;  Location: [facility];  Service: General;  Laterality: Right;    Social History   Socioeconomic History   Marital status: Married    Spouse name: Not on file   Number of children: Not on file   Years of education: Not  on file   Highest education level: Not on file  Occupational History   Not on file  Tobacco Use   Smoking status: Former    Packs/day: 0.50    Years: 1.00    Additional pack years: 0.00    Total pack years: 0.50    Types: Cigarettes    Passive exposure: Never   Smokeless tobacco: Never  Vaping Use   Vaping Use: Never used  Substance and Sexual Activity   Alcohol use: Not Currently    Comment: occ.   Drug use: No   Sexual activity: Not Currently    Birth control/protection: Surgical    Comment: hyst  Other Topics Concern   Not on file  Social History Narrative   Not on file   Social Determinants of Health   Financial Resource Strain: Not on file  Food Insecurity:  Not on file  Transportation Needs: Not on file  Physical Activity: Not on file  Stress: Not on file  Social Connections: Not on file    Family History  Problem Relation Age of Onset   Lung cancer Father 58   Colon cancer Neg Hx    Esophageal cancer Neg Hx    Rectal cancer Neg Hx    Stomach cancer Neg Hx    Adrenal disorder Neg Hx    Liver disease Neg Hx    Inflammatory bowel disease Neg Hx    Pancreatic cancer Neg Hx     Outpatient Encounter Medications as of 02/17/2023  Medication Sig   ALPRAZolam (XANAX) 1 MG tablet Take 1 mg by mouth 2 (two) times daily as needed.   buPROPion (WELLBUTRIN XL) 150 MG 24 hr tablet Take 150 mg by mouth every morning.   cholestyramine (QUESTRAN) 4 g packet Take 1 packet (4 g total) by mouth 2 (two) times daily.   cromolyn (GASTROCROM) 100 MG/5ML solution Take by mouth as directed.   EPINEPHrine (EPIPEN IJ) Inject as directed. As needed   famotidine (PEPCID) 40 MG tablet Take 40 mg by mouth daily.   KETOTIFEN FUMARATE OP Take by mouth as needed.   Levothyroxine Sodium 137 MCG/ML SOLN Take 137 mcg by mouth daily before breakfast.   losartan (COZAAR) 25 MG tablet Take 1 tablet (25 mg total) by mouth daily.   methocarbamol (ROBAXIN) 500 MG tablet Take 1 tablet (500 mg  total) by mouth daily as needed for muscle spasms.   omeprazole (PRILOSEC) 40 MG capsule Take 40 mg by mouth in the morning and at bedtime.   potassium chloride (KLOR-CON) 20 MEQ packet Take 2 packets by mouth daily.   promethazine (PHENERGAN) 25 MG tablet Take 25 mg by mouth every 8 (eight) hours as needed for nausea or vomiting.   Vitamin D, Ergocalciferol, (DRISDOL) 1.25 MG (50000 UNIT) CAPS capsule Take 1 capsule (50,000 Units total) by mouth 2 (two) times a week.   [DISCONTINUED] Levothyroxine Sodium 112 MCG/ML SOLN Take 112 mcg by mouth daily before breakfast.   metoprolol tartrate (LOPRESSOR) 25 MG tablet Take 0.5 tablets (12.5 mg total) by mouth 2 (two) times daily as needed (palpitations). (Patient not taking: Reported on 12/25/2022)   Facility-Administered Encounter Medications as of 02/17/2023  Medication   0.9 %  sodium chloride infusion    ALLERGIES: Allergies  Allergen Reactions   Alpha-Gal Anaphylaxis   Cinnamon Swelling and Other (See Comments)    REACTION: throat swells   Bee Venom Swelling and Other (See Comments)    REACTION: All over body swelling   Vicodin [Hydrocodone-Acetaminophen] Nausea And Vomiting   Benadryl [Diphenhydramine]     Couldn't talk or function with injectable- pill gives chest pain and hives and SOB   Dilaudid [Hydromorphone Hcl] Hives   Toradol [Ketorolac Tromethamine]     Hives, and chest pain   VACCINATION STATUS: Immunization History  Administered Date(s) Administered   Influenza,inj,Quad PF,6+ Mos 02/28/2016   Moderna Sars-Covid-2 Vaccination 02/08/2020, 03/07/2020     HPI   Gwendolyn Glass is a patient with the above medical history. she was diagnosed with hypothyroidism which required subsequent initiation of thyroid hormone replacement therapy. she was given various doses of Levothyroxine, branded-Synthroid, and Tirosint over the years, currently on Tirosint 100 micrograms. she reports compliance to this medication:  Taking it daily  on empty stomach with water, separated by >30 minutes before breakfast and other medications, and by at least 4  hours from calcium, iron, PPIs, multivitamins.  She notes she is taking cholestyramine daily to help with absorption problems.  She describes her disease course as starting out as diarrhea which came after every meal or liquid.  Then she was diagnosed with alpha-gal about 2 years ago and then, slowly she started to develop other symptoms, dizziness, abdominal cramping, headaches, near syncopal episodes.  She has seen many different specialists, was being worked up for POTS but wanted to have her thyroid managed first at it may be affecting her other symptoms.  Since last visit, she has seen Rheumatology who initiated referral to neurology due to high inflammation markers.  She never heard back from GI for a second opinion.  She also went to [facility] who is working her up for mold issues.   I reviewed patient's thyroid tests:  Lab Results  Component Value Date   TSH 10.300 (H) 02/11/2023   TSH 24.300 (H) 12/12/2022   TSH 6.420 (H) 10/08/2022   TSH 17.10 (A) 08/17/2022   TSH 11.70 (A) 05/23/2022   TSH 15.20 (A) 04/10/2022   TSH 18.49 (H) 06/22/2021   TSH 3.56 03/07/2021   FREET4 0.90 02/11/2023   FREET4 0.87 12/12/2022   FREET4 1.11 10/08/2022   FREET4 0.90 03/07/2021    Pt denies feeling nodules in neck, hoarseness, dysphagia/odynophagia, SOB with lying down.  she does not know of family history of thyroid disorders including cancer.  No history of radiation therapy to head or neck.  No recent use of iodine supplements.  Denies use of Biotin containing supplements.  She has had thyroid ultrasound in August 2022 which was normal with exception of noting cell heterogeneity.  I reviewed her chart and she also has a history of GERD, tobacco abuse, malabsorption issues.   ROS:  Constitutional: + weight gain, + fatigue, no subjective hyperthermia, no  subjective hypothermia, chronic headache Eyes: no blurry vision, no xerophthalmia ENT: no sore throat, no nodules palpated in throat, no dysphagia/odynophagia, no hoarseness Cardiovascular: no chest pain, no SOB, + intermittent palpitations, no leg swelling, dizziness with position changes (being worked up for POTS) Respiratory: no cough, no SOB Gastrointestinal: no nausea/vomiting, + chronic diarrhea triggered by any oral intake Musculoskeletal: no muscle/joint aches, intermittent diffuse muscle spasms-worsening Skin: no rashes Neurological: no tremors, no numbness, no tingling, + intermittent dizziness Psychiatric: no depression, no anxiety   Objective:   Objective     BP 129/86 (BP Location: Left Arm, Patient Position: Sitting, Cuff Size: Large)   Pulse (!) 102   Ht 5\' 8"  (1.727 m)   Wt 270 lb (122.5 kg)   LMP 04/11/2017 (Exact Date) Comment: has had continual periods  BMI 41.05 kg/m  Wt Readings from Last 3 Encounters:  02/17/23 270 lb (122.5 kg)  01/22/23 261 lb 6.4 oz (118.6 kg)  12/25/22 256 lb (116.1 kg)    BP Readings from Last 3 Encounters:  02/17/23 129/86  01/22/23 120/80  12/25/22 (!) 144/95      Physical Exam- Limited  Constitutional:  Body mass index is 41.05 kg/m. , not in acute distress, normal state of mind Eyes:  EOMI, no exophthalmos Musculoskeletal: no gross deformities, strength intact in all four extremities, no gross restriction of joint movements Skin:  no rashes, no hyperemia Neurological: no tremor with outstretched hands    CMP ( most recent) CMP     Component Value Date/Time   NA 142 12/25/2022 1150   NA 142 07/26/2022 1115   K 4.0 12/25/2022  1150   CL 107 12/25/2022 1150   CO2 26 12/25/2022 1150   GLUCOSE 83 12/25/2022 1150   BUN 8 12/25/2022 1150   BUN 8 07/26/2022 1115   CREATININE 0.83 12/25/2022 1150   CALCIUM 9.2 12/25/2022 1150   PROT 6.8 12/25/2022 1150   PROT 6.6 12/25/2022 1150   PROT 6.0 09/28/2020 1443   ALBUMIN  4.2 06/22/2021 1125   AST 26 12/25/2022 1150   ALT 40 (H) 12/25/2022 1150   ALKPHOS 83 06/22/2021 1125   BILITOT 0.4 12/25/2022 1150   GFRNONAA >60 11/10/2020 1622   GFRAA >60 09/17/2018 0525     Diabetic Labs (most recent): No results found for: "HGBA1C", "MICROALBUR"   Lipid Panel ( most recent) Lipid Panel  No results found for: "CHOL", "TRIG", "HDL", "CHOLHDL", "VLDL", "LDLCALC", "LDLDIRECT", "LABVLDL"     Lab Results  Component Value Date   TSH 10.300 (H) 02/11/2023   TSH 24.300 (H) 12/12/2022   TSH 6.420 (H) 10/08/2022   TSH 17.10 (A) 08/17/2022   TSH 11.70 (A) 05/23/2022   TSH 15.20 (A) 04/10/2022   TSH 18.49 (H) 06/22/2021   TSH 3.56 03/07/2021   FREET4 0.90 02/11/2023   FREET4 0.87 12/12/2022   FREET4 1.11 10/08/2022   FREET4 0.90 03/07/2021      Thyroid US from 07/30/22 US Thyroid  Anatomical Region Laterality Modality  Neck -- Ultrasound   Impression  Thyroid heterogeneity without discrete nodule.  The above is in keeping with the ACR TI-RADS recommendations - J Am Coll Radiol 2017;14:587-595.   Electronically Signed   By: Jerilynn Mages.  Shick M.D.   On: 07/30/2021 13:33 Narrative  CLINICAL DATA:  Palpable left thyroid nodule, neck fullness  EXAM: THYROID ULTRASOUND  TECHNIQUE: Ultrasound examination of the thyroid gland and adjacent soft tissues was performed.  COMPARISON:  None.  FINDINGS: Parenchymal Echotexture: Moderately heterogenous  Isthmus: 3 mm  Right lobe: 4.3 x 1.8 x 1.3 cm  Left lobe: 3.6 x 1.8 x 1.3 cm  _________________________________________________________  Estimated total number of nodules >/= 1 cm: 0  Number of spongiform nodules >/=  2 cm not described below (TR1): 0  Number of mixed cystic and solid nodules >/= 1.5 cm not described below (TR2): 0  _________________________________________________________  Nonspecific thyroid heterogeneity suggesting medical thyroid disease. No hypervascularity or nodule. No  adenopathy. Procedure Note  Gasper Sells, MD - 07/30/2021 Formatting of this note might be different from the original. CLINICAL DATA:  Palpable left thyroid nodule, neck fullness  EXAM: THYROID ULTRASOUND  TECHNIQUE: Ultrasound examination of the thyroid gland and adjacent soft tissues was performed.  COMPARISON:  None.  FINDINGS: Parenchymal Echotexture: Moderately heterogenous  Isthmus: 3 mm  Right lobe: 4.3 x 1.8 x 1.3 cm  Left lobe: 3.6 x 1.8 x 1.3 cm  _________________________________________________________  Estimated total number of nodules >/= 1 cm: 0  Number of spongiform nodules >/=  2 cm not described below (TR1): 0  Number of mixed cystic and solid nodules >/= 1.5 cm not described below (TR2): 0  _________________________________________________________  Nonspecific thyroid heterogeneity suggesting medical thyroid disease. No hypervascularity or nodule. No adenopathy.  IMPRESSION: Thyroid heterogeneity without discrete nodule.  The above is in keeping with the ACR TI-RADS recommendations - J Am Coll Radiol 2017;14:587-595.   Electronically Signed   By: Jerilynn Mages.  Shick M.D.   On: 07/30/2021 13:33 Exam End: 07/30/21 13:32   Specimen Collected: 07/30/21 13:32 Last Resulted: 07/30/21 13:33  Received From: [facility]  Latest Reference Range & Units 05/23/22 00:00 08/17/22 00:00 10/08/22 13:57 12/12/22 16:18 02/11/23 15:53  TSH 0.450 - 4.500 uIU/mL 11.70 ! (E) 17.10 ! (E) 6.420 (H) 24.300 (H) 10.300 (H)  Triiodothyronine,Free,Serum 2.0 - 4.4 pg/mL   2.5    T4,Free(Direct) 0.82 - 1.77 ng/dL   1.11 0.87 0.90  Thyroperoxidase Ab SerPl-aCnc 0 - 34 IU/mL   160 (H)    Thyroglobulin Antibody 0.0 - 0.9 IU/mL   2.2 (H)    !: Data is abnormal (H): Data is abnormally high (E): External lab result   Assessment & Plan:   ASSESSMENT / PLAN:  1. Hypothyroidism-r/t Hashimotos thyroiditis   Patient with long-standing hypothyroidism, on thyroid  hormone replacement therapy. On physical exam, patient does not have gross goiter, thyroid nodules, or neck compression symptoms.  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  They have improved slightly, possibly indicating she is tolerating the solution better?  I will increase her  Levothyroxine solution to 137 mcg po daily before breakfast.   - We discussed about correct intake of levothyroxine, at fasting, with water, separated by at least 30 minutes from breakfast, and separated by more than 4 hours from calcium, iron, multivitamins, acid reflux medications (PPIs). -Patient is made aware of the fact that thyroid hormone replacement is needed for life, dose to be adjusted by periodic monitoring of thyroid function tests.    -Due to absence of clinical goiter and essentially normal [place] from a year ago, no need for additional thyroid ultrasound at this time.  She has follow up with Rheumatology coming up and also neurology as well.  I re-entered referral to GI today for second opinion for her chronic diarrhea, malabsorption syndrome affecting her overall health.  She has been on Creon in the past which did not help, ruling out EPI as potential endocrine cause.  She never heard back from the original referral.     I spent  30  minutes in the care of the patient today including review of labs from Thyroid Function, CMP, and other relevant labs ; imaging/biopsy records (current and previous including abstractions from other facilities); face-to-face time discussing  her lab results and symptoms, medications doses, her options of short and long term treatment based on the latest standards of care / guidelines;   and documenting the encounter.  [name]  participated in the discussions, expressed understanding, and voiced agreement with the above  plans.  All questions were answered to her satisfaction. she is encouraged to contact clinic should she have any questions or concerns prior to her return visit.   FOLLOW UP PLAN:  Return in about 2 months (around 04/19/2023) for Thyroid follow up, Previsit labs.  Rayetta Pigg, [facility] [address] [place], [place] [address] Phone: [phone] Fax: [phone]  02/17/2023, 4:12 PM

## 2023-02-19 NOTE — Progress Notes (Deleted)
Office Visit Note  Patient: Gwendolyn Glass             Date of Birth: [birth year]           MRN: [id number]             PCP: Lanelle Bal, PA-C Referring: Lanelle Bal, PA-C Visit Date: 03/05/2023 Occupation: @GUAROCC @  Subjective:    History of Present Illness: Gwendolyn Glass is a 42 y.o. female with history of elevated CK and positive ANA.   She is scheduled to establish care with neurology on 04/01/23.   Lab work from 01/22/23: Magnesium WNL, myositis panel WNL, and aldolase elevated-8.4.   Reviewed endocrinology note from 02/17/23: medication change--Levothyroxine solution to 137 mcg po daily before breakfast (increased dose).    Activities of Daily Living:  Patient reports morning stiffness for *** {minute/hour:19697}.   Patient {ACTIONS;DENIES/REPORTS:21021675::"Denies"} nocturnal pain.  Difficulty dressing/grooming: {ACTIONS;DENIES/REPORTS:21021675::"Denies"} Difficulty climbing stairs: {ACTIONS;DENIES/REPORTS:21021675::"Denies"} Difficulty getting out of chair: {ACTIONS;DENIES/REPORTS:21021675::"Denies"} Difficulty using hands for taps, buttons, cutlery, and/or writing: {ACTIONS;DENIES/REPORTS:21021675::"Denies"}  No Rheumatology ROS completed.   PMFS History:  Patient Active Problem List   Diagnosis Date Noted   Unintentional weight loss 06/23/2021   Chronic abdominal pain 06/23/2021   Chronic diarrhea 06/23/2021   History of cholecystectomy 06/23/2021   Hypokalemia 03/07/2021   Hypothyroidism 03/07/2021   S/P hysterectomy 09/16/2018   Cellulitis of right upper extremity    Ovarian mass, right 03/01/2016   RLQ abdominal pain 02/29/2016   Nausea with vomiting 02/29/2016   Diarrhea 02/29/2016   C. difficile diarrhea 02/29/2016   C. difficile colitis 02/29/2016   Morbid obesity ([place]) 02/28/2016   Tobacco abuse 02/28/2016   [place] recluse spider bite 02/27/2016   Ectopic pregnancy, tubal 09/07/2014    Past Medical History:  Diagnosis Date   Allergy     Allergy to alpha-gal    Allergy to alpha-gal    Anxiety    [place] recluse spider bite 02/27/2016   C. difficile diarrhea 02/29/2016   Depression    GERD (gastroesophageal reflux disease)    Hypertension    Hypokalemia    Miscarriage    pt. states shes  spotting  and wearing a tampon   Ovarian mass, right 03/01/2016   ? complex cyst   Pregnant    Thyroid disease     Family History  Problem Relation Age of Onset   Lung cancer Father 66   Colon cancer Neg Hx    Esophageal cancer Neg Hx    Rectal cancer Neg Hx    Stomach cancer Neg Hx    Adrenal disorder Neg Hx    Liver disease Neg Hx    Inflammatory bowel disease Neg Hx    Pancreatic cancer Neg Hx    Past Surgical History:  Procedure Laterality Date   ABDOMINAL HYSTERECTOMY N/A 09/16/2018   Procedure: HYSTERECTOMY ABDOMINAL;  Surgeon: Florian Buff, MD;  Location: [facility];  Service: Gynecology;  Laterality: N/A;   APPENDECTOMY     CESAREAN SECTION     CHOLECYSTECTOMY     CHOLECYSTECTOMY  07/28/2012   Procedure: LAPAROSCOPIC CHOLECYSTECTOMY;  Surgeon: Donato Heinz, MD;  Location: [facility];  Service: General;  Laterality: N/A;   LAPAROSCOPY N/A 09/07/2014   Procedure: LAPAROSCOPY OPERATIVE;  Surgeon: Woodroe Mode, MD;  Location: [facility];  Service: Gynecology;  Laterality: N/A;   removal of lt fallopian tube     UNILATERAL SALPINGECTOMY Right 09/07/2014   Procedure: UNILATERAL SALPINGECTOMY;  Surgeon: Woodroe Mode, MD;  Location: [facility];  Service: Gynecology;  Laterality: Right;   WOUND DEBRIDEMENT Right 02/28/2016   Procedure: DEBRIDEMENT OF RIGHT SHOULDER SPIDER BITE;  Surgeon: Aviva Signs, MD;  Location: [facility];  Service: General;  Laterality: Right;   Social History   Social History Narrative   Not on file   Immunization History  Administered Date(s) Administered   Influenza,inj,Quad PF,6+ Mos 02/28/2016   Moderna Sars-Covid-2 Vaccination 02/08/2020, 03/07/2020     Objective: Vital Signs: LMP 04/11/2017 (Exact  Date) Comment: has had continual periods   Physical Exam Vitals and nursing note reviewed.  Constitutional:      Appearance: She is well-developed.  HENT:     Head: Normocephalic and atraumatic.  Eyes:     Conjunctiva/sclera: Conjunctivae normal.  Cardiovascular:     Rate and Rhythm: Normal rate and regular rhythm.     Heart sounds: Normal heart sounds.  Pulmonary:     Effort: Pulmonary effort is normal.     Breath sounds: Normal breath sounds.  Abdominal:     General: Bowel sounds are normal.     Palpations: Abdomen is soft.  Musculoskeletal:     Cervical back: Normal range of motion.  Lymphadenopathy:     Cervical: No cervical adenopathy.  Skin:    General: Skin is warm and dry.     Capillary Refill: Capillary refill takes less than 2 seconds.  Neurological:     Mental Status: She is alert and oriented to person, place, and time.  Psychiatric:        Behavior: Behavior normal.      Musculoskeletal Exam: ***  CDAI Exam: CDAI Score: -- Patient Global: --; Provider Global: -- Swollen: --; Tender: -- Joint Exam 03/05/2023   No joint exam has been documented for this visit   There is currently no information documented on the homunculus. Go to the Rheumatology activity and complete the homunculus joint exam.  Investigation: No additional findings.  Imaging: No results found.  Recent Labs: Lab Results  Component Value Date   WBC 7.9 12/25/2022   HGB 13.3 12/25/2022   PLT 151 12/25/2022   NA 142 12/25/2022   K 4.0 12/25/2022   CL 107 12/25/2022   CO2 26 12/25/2022   GLUCOSE 83 12/25/2022   BUN 8 12/25/2022   CREATININE 0.83 12/25/2022   BILITOT 0.4 12/25/2022   ALKPHOS 83 06/22/2021   AST 26 12/25/2022   ALT 40 (H) 12/25/2022   PROT 6.8 12/25/2022   PROT 6.6 12/25/2022   ALBUMIN 4.2 06/22/2021   CALCIUM 9.2 12/25/2022   GFRAA >60 09/17/2018    Speciality Comments: No specialty comments available.  Procedures:  No procedures  performed Allergies: Alpha-gal, Cinnamon, Bee venom, Vicodin [hydrocodone-acetaminophen], Benadryl [diphenhydramine], Dilaudid [hydromorphone hcl], and Toradol [ketorolac tromethamine]   Assessment / Plan:     Visit Diagnoses: Elevated CK  Positive ANA (antinuclear antibody)  Abnormal laboratory test  Polyarthralgia  Brain fog  Pain in both hands  Chronic pain of both knees  Other fatigue  Myalgia  Hypothyroidism due to Hashimoto's thyroiditis  Vitamin D deficiency  POTS (postural orthostatic tachycardia syndrome)  Chronic diarrhea  Muscle spasms of both lower extremities  History of cholecystectomy  S/P hysterectomy  Tobacco abuse  C. difficile colitis  Orders: No orders of the defined types were placed in this encounter.  No orders of the defined types were placed in this encounter.   Face-to-face time spent with patient was *** minutes. Greater than 50% of time was spent  in counseling and coordination of care.  Follow-Up Instructions: No follow-ups on file.   Ofilia Neas, PA-C  Note - This record has been created using Dragon software.  Chart creation errors have been sought, but may not always  have been located. Such creation errors do not reflect on  the standard of medical care.

## 2023-03-05 ENCOUNTER — Ambulatory Visit: Payer: BC Managed Care – PPO | Admitting: Physician Assistant

## 2023-03-05 DIAGNOSIS — M79641 Pain in right hand: Secondary | ICD-10-CM

## 2023-03-05 DIAGNOSIS — M62838 Other muscle spasm: Secondary | ICD-10-CM

## 2023-03-05 DIAGNOSIS — E559 Vitamin D deficiency, unspecified: Secondary | ICD-10-CM

## 2023-03-05 DIAGNOSIS — M791 Myalgia, unspecified site: Secondary | ICD-10-CM

## 2023-03-05 DIAGNOSIS — R748 Abnormal levels of other serum enzymes: Secondary | ICD-10-CM

## 2023-03-05 DIAGNOSIS — R899 Unspecified abnormal finding in specimens from other organs, systems and tissues: Secondary | ICD-10-CM

## 2023-03-05 DIAGNOSIS — E038 Other specified hypothyroidism: Secondary | ICD-10-CM

## 2023-03-05 DIAGNOSIS — G90A Postural orthostatic tachycardia syndrome (POTS): Secondary | ICD-10-CM

## 2023-03-05 DIAGNOSIS — Z720 Tobacco use: Secondary | ICD-10-CM

## 2023-03-05 DIAGNOSIS — K529 Noninfective gastroenteritis and colitis, unspecified: Secondary | ICD-10-CM

## 2023-03-05 DIAGNOSIS — R768 Other specified abnormal immunological findings in serum: Secondary | ICD-10-CM

## 2023-03-05 DIAGNOSIS — Z9049 Acquired absence of other specified parts of digestive tract: Secondary | ICD-10-CM

## 2023-03-05 DIAGNOSIS — A0472 Enterocolitis due to Clostridium difficile, not specified as recurrent: Secondary | ICD-10-CM

## 2023-03-05 DIAGNOSIS — R5383 Other fatigue: Secondary | ICD-10-CM

## 2023-03-05 DIAGNOSIS — R4189 Other symptoms and signs involving cognitive functions and awareness: Secondary | ICD-10-CM

## 2023-03-05 DIAGNOSIS — G8929 Other chronic pain: Secondary | ICD-10-CM

## 2023-03-05 DIAGNOSIS — Z90710 Acquired absence of both cervix and uterus: Secondary | ICD-10-CM

## 2023-03-05 DIAGNOSIS — M2550 Pain in unspecified joint: Secondary | ICD-10-CM

## 2023-04-01 ENCOUNTER — Encounter: Payer: Self-pay | Admitting: Diagnostic Neuroimaging

## 2023-04-01 ENCOUNTER — Ambulatory Visit (INDEPENDENT_AMBULATORY_CARE_PROVIDER_SITE_OTHER): Payer: BC Managed Care – PPO | Admitting: Diagnostic Neuroimaging

## 2023-04-01 ENCOUNTER — Telehealth: Payer: Self-pay | Admitting: Diagnostic Neuroimaging

## 2023-04-01 VITALS — BP 132/82 | HR 101 | Ht 68.0 in | Wt 273.0 lb

## 2023-04-01 DIAGNOSIS — M62838 Other muscle spasm: Secondary | ICD-10-CM | POA: Diagnosis not present

## 2023-04-01 NOTE — Telephone Encounter (Signed)
[employer] and [facility] NPR sent to GI [phone]

## 2023-04-01 NOTE — Patient Instructions (Signed)
MUSCLE SPASMS (~2020; no focal weakness on exam) - repeat CK, aldolase - check MRI brain w/wo - consider EMG/NCS in future, but low yield with normal neuro exam  B12 deficiency - repeat labs; follow up with PCP for B12 replacement injections

## 2023-04-01 NOTE — Progress Notes (Signed)
[facility]  PATIENT: Gwendolyn Glass DOB: Dec 26, 1980  REFERRING CLINICIAN: Pollyann Savoy, MD HISTORY FROM: patient  REASON FOR VISIT: new consult   HISTORICAL  CHIEF COMPLAINT:  Chief Complaint  Patient presents with   New Patient (Initial Visit)    Patient in room #7 with her husband Gala Romney). Patient states she here today for elevated Labs and muscle spasm.    HISTORY OF PRESENT ILLNESS:   42 year old female here for evaluation of abnormal muscle enzyme testing.  Patient has had constellation of chronic symptoms since past 10 to 12 years.  Initially was having issues with progressive chronic diarrhea symptoms.  Then in the last 4 years has had issues with spasms in the legs and arms, muscle twitching throughout, exertional cramps, fatigue, malaise, tachycardia.  Has been evaluated by a variety of specialist.  More recently has been diagnosed with Hashimoto's thyroiditis and is on hormone treatment.  Went to rheumatology for evaluation was noted to have slightly elevated CK and aldolase in the setting of muscle pain.  Referred here for further evaluation.    REVIEW OF SYSTEMS: Full 14 system review of systems performed and negative with exception of: as per HPI.  ALLERGIES: Allergies  Allergen Reactions   Alpha-Gal Anaphylaxis   Cinnamon Swelling and Other (See Comments)    REACTION: throat swells   Bee Venom Swelling and Other (See Comments)    REACTION: All over body swelling   Vicodin [Hydrocodone-Acetaminophen] Nausea And Vomiting   Benadryl [Diphenhydramine]     Couldn't talk or function with injectable- pill gives chest pain and hives and SOB   Dilaudid [Hydromorphone Hcl] Hives   Toradol [Ketorolac Tromethamine]     Hives, and chest pain    HOME MEDICATIONS: Outpatient Medications Prior to Visit  Medication Sig Dispense Refill   ALPRAZolam (XANAX) 1 MG tablet Take 1 mg by mouth 2 (two) times daily as needed.     buPROPion (WELLBUTRIN  XL) 150 MG 24 hr tablet Take 150 mg by mouth every morning.     cholestyramine (QUESTRAN) 4 g packet Take 1 packet (4 g total) by mouth 2 (two) times daily. 60 each 6   cromolyn (GASTROCROM) 100 MG/5ML solution Take by mouth as directed.     Cyanocobalamin (B-12) 1000 MCG SUBL Place under the tongue.     EPINEPHrine (EPIPEN IJ) Inject as directed. As needed     famotidine (PEPCID) 40 MG tablet Take 40 mg by mouth daily.     KETOTIFEN FUMARATE OP Take by mouth as needed.     Levothyroxine Sodium 137 MCG/ML SOLN Take 137 mcg by mouth daily before breakfast. 30 mL 3   losartan (COZAAR) 25 MG tablet Take 1 tablet (25 mg total) by mouth daily. 30 tablet 6   methocarbamol (ROBAXIN) 500 MG tablet Take 1 tablet (500 mg total) by mouth daily as needed for muscle spasms. 15 tablet 0   metoprolol tartrate (LOPRESSOR) 25 MG tablet Take 0.5 tablets (12.5 mg total) by mouth 2 (two) times daily as needed (palpitations). 30 tablet 2   omeprazole (PRILOSEC) 40 MG capsule Take 40 mg by mouth in the morning and at bedtime.     potassium chloride (KLOR-CON) 20 MEQ packet Take 2 packets by mouth daily.     promethazine (PHENERGAN) 25 MG tablet Take 25 mg by mouth every 8 (eight) hours as needed for nausea or vomiting.     Vitamin D, Ergocalciferol, (DRISDOL) 1.25 MG (50000 UNIT) CAPS capsule Take 1 capsule (  50,000 Units total) by mouth 2 (two) times a week. 24 capsule 0   Facility-Administered Medications Prior to Visit  Medication Dose Route Frequency Provider Last Rate Last Admin   0.9 %  sodium chloride infusion  500 mL Intravenous Once Mansouraty, Netty Starring., MD        PAST MEDICAL HISTORY: Past Medical History:  Diagnosis Date   Allergy    Allergy to alpha-gal    Allergy to alpha-gal    Anxiety    [place] recluse spider bite 02/27/2016   C. difficile diarrhea 02/29/2016   Depression    GERD (gastroesophageal reflux disease)    Hypertension    Hypokalemia    Miscarriage    pt. states shes  spotting   and wearing a tampon   Ovarian mass, right 03/01/2016   ? complex cyst   Pregnant    Thyroid disease     PAST SURGICAL HISTORY: Past Surgical History:  Procedure Laterality Date   ABDOMINAL HYSTERECTOMY N/A 09/16/2018   Procedure: HYSTERECTOMY ABDOMINAL;  Surgeon: Lazaro Arms, MD;  Location: [facility];  Service: Gynecology;  Laterality: N/A;   APPENDECTOMY     CESAREAN SECTION     CHOLECYSTECTOMY     CHOLECYSTECTOMY  07/28/2012   Procedure: LAPAROSCOPIC CHOLECYSTECTOMY;  Surgeon: Fabio Bering, MD;  Location: [facility];  Service: General;  Laterality: N/A;   LAPAROSCOPY N/A 09/07/2014   Procedure: LAPAROSCOPY OPERATIVE;  Surgeon: Adam Phenix, MD;  Location: [facility];  Service: Gynecology;  Laterality: N/A;   removal of lt fallopian tube     UNILATERAL SALPINGECTOMY Right 09/07/2014   Procedure: UNILATERAL SALPINGECTOMY;  Surgeon: Adam Phenix, MD;  Location: [facility];  Service: Gynecology;  Laterality: Right;   WOUND DEBRIDEMENT Right 02/28/2016   Procedure: DEBRIDEMENT OF RIGHT SHOULDER SPIDER BITE;  Surgeon: Franky Macho, MD;  Location: [facility];  Service: General;  Laterality: Right;    FAMILY HISTORY: Family History  Problem Relation Age of Onset   Lung cancer Father 40   Colon cancer Neg Hx    Esophageal cancer Neg Hx    Rectal cancer Neg Hx    Stomach cancer Neg Hx    Adrenal disorder Neg Hx    Liver disease Neg Hx    Inflammatory bowel disease Neg Hx    Pancreatic cancer Neg Hx     SOCIAL HISTORY: Social History   Socioeconomic History   Marital status: Married    Spouse name: Not on file   Number of children: Not on file   Years of education: Not on file   Highest education level: Not on file  Occupational History   Not on file  Tobacco Use   Smoking status: Former    Packs/day: 0.50    Years: 1.00    Additional pack years: 0.00    Total pack years: 0.50    Types: Cigarettes    Passive exposure: Never   Smokeless tobacco: Never  Vaping Use   Vaping Use:  Never used  Substance and Sexual Activity   Alcohol use: Not Currently    Comment: occ.   Drug use: No   Sexual activity: Not Currently    Birth control/protection: Surgical    Comment: hyst  Other Topics Concern   Not on file  Social History Narrative   Not on file   Social Determinants of Health   Financial Resource Strain: Not on file  Food Insecurity: Not on file  Transportation Needs: Not on file  Physical  Activity: Not on file  Stress: Not on file  Social Connections: Not on file  Intimate Partner Violence: Not on file     PHYSICAL EXAM  GENERAL EXAM/CONSTITUTIONAL: Vitals:  Vitals:   04/01/23 0828  BP: 132/82  Pulse: (!) 101  Weight: 273 lb (123.8 kg)  Height: 5\' 8"  (1.727 m)   Body mass index is 41.51 kg/m. Wt Readings from Last 3 Encounters:  04/01/23 273 lb (123.8 kg)  02/17/23 270 lb (122.5 kg)  01/22/23 261 lb 6.4 oz (118.6 kg)   Patient is in no distress; well developed, nourished and groomed; neck is supple  CARDIOVASCULAR: Examination of carotid arteries is normal; no carotid bruits Regular rate and rhythm, no murmurs Examination of peripheral vascular system by observation and palpation is normal  EYES: Ophthalmoscopic exam of optic discs and posterior segments is normal; no papilledema or hemorrhages No results found.  MUSCULOSKELETAL: Gait, strength, tone, movements noted in Neurologic exam below  NEUROLOGIC: MENTAL STATUS:      No data to display         awake, alert, oriented to person, place and time recent and remote memory intact normal attention and concentration language fluent, comprehension intact, naming intact fund of knowledge appropriate  CRANIAL NERVE:  2nd - no papilledema on fundoscopic exam 2nd, 3rd, 4th, 6th - pupils equal and reactive to light, visual fields full to confrontation, extraocular muscles intact, no nystagmus 5th - facial sensation symmetric 7th - facial strength symmetric 8th - hearing  intact 9th - palate elevates symmetrically, uvula midline 11th - shoulder shrug symmetric 12th - tongue protrusion midline  MOTOR:  normal bulk and tone, full strength in the BUE, BLE  SENSORY:  normal and symmetric to light touch, temperature, vibration  COORDINATION:  finger-nose-finger, fine finger movements normal  REFLEXES:  deep tendon reflexes TRACE and symmetric  GAIT/STATION:  narrow based gait     DIAGNOSTIC DATA (LABS, IMAGING, TESTING) - I reviewed patient records, labs, notes, testing and imaging myself where available.  Lab Results  Component Value Date   WBC 7.9 12/25/2022   HGB 13.3 12/25/2022   HCT 39.7 12/25/2022   MCV 85.2 12/25/2022   PLT 151 12/25/2022      Component Value Date/Time   NA 142 12/25/2022 1150   NA 142 07/26/2022 1115   K 4.0 12/25/2022 1150   CL 107 12/25/2022 1150   CO2 26 12/25/2022 1150   GLUCOSE 83 12/25/2022 1150   BUN 8 12/25/2022 1150   BUN 8 07/26/2022 1115   CREATININE 0.83 12/25/2022 1150   CALCIUM 9.2 12/25/2022 1150   PROT 6.8 12/25/2022 1150   PROT 6.6 12/25/2022 1150   PROT 6.0 09/28/2020 1443   ALBUMIN 4.2 06/22/2021 1125   AST 26 12/25/2022 1150   ALT 40 (H) 12/25/2022 1150   ALKPHOS 83 06/22/2021 1125   BILITOT 0.4 12/25/2022 1150   GFRNONAA >60 11/10/2020 1622   GFRAA >60 09/17/2018 0525   No results found for: "CHOL", "HDL", "LDLCALC", "LDLDIRECT", "TRIG", "CHOLHDL" No results found for: "HGBA1C" No results found for: "VITAMINB12" Lab Results  Component Value Date   TSH 10.300 (H) 02/11/2023    Lab Results  Component Value Date   CKTOTAL 178 (H) 12/25/2022   Aldolase  Date Value Ref Range Status  01/22/2023 8.4 (H) < OR = 8.1 U/L Final      ASSESSMENT AND PLAN  42 y.o. year old female here with constellation of symptoms including chronic diarrhea, fatigue, malaise, muscle pain,  also diagnosed with Hashimoto's thyroiditis and B12 deficiency.   Dx:  1. Muscle spasm       PLAN:  MUSCLE SPASMS / MYALGIAS (since ~2020; no focal weakness on exam) - repeat CK, aldolase (minimal abnl elevation is non-specific; follow up labs) - check MRI brain w/wo (rule out demyelinating disease) - consider EMG/NCS in future (pending labs and symptoms)  B12 deficiency - repeat labs; follow up with PCP for B12 replacement injections  Orders Placed This Encounter  Procedures   MR BRAIN W WO CONTRAST   Vitamin B12   Intrinsic Factor Antibodies   Anti-parietal antibody   CK   Aldolase   Return for pending if symptoms worsen or fail to improve, pending test results.    Suanne Marker, MD 04/01/2023, 10:01 AM Certified in Neurology, Neurophysiology and Neuroimaging  [facility] [address] [place], [place] [address] [phone]

## 2023-04-02 LAB — ALDOLASE

## 2023-04-03 LAB — VITAMIN B12: Vitamin B-12: 835 pg/mL (ref 232–1245)

## 2023-04-03 LAB — INTRINSIC FACTOR ANTIBODIES: Intrinsic Factor Abs, Serum: 1.1 AU/mL (ref 0.0–1.1)

## 2023-04-03 LAB — ANTI-PARIETAL ANTIBODY: Parietal Cell Ab: 71.4 Units — ABNORMAL HIGH (ref 0.0–20.0)

## 2023-04-03 LAB — CK: Total CK: 199 U/L — ABNORMAL HIGH (ref 32–182)

## 2023-04-10 NOTE — Addendum Note (Signed)
Addended by: Joycelyn Schmid R on: 04/10/2023 10:55 AM   Modules accepted: Orders

## 2023-04-14 ENCOUNTER — Ambulatory Visit: Payer: BC Managed Care – PPO | Attending: Cardiology | Admitting: Cardiology

## 2023-04-14 ENCOUNTER — Encounter: Payer: Self-pay | Admitting: Neurology

## 2023-04-14 ENCOUNTER — Encounter: Payer: Self-pay | Admitting: Cardiology

## 2023-04-14 VITALS — BP 102/60 | HR 112 | Ht 68.0 in | Wt 266.6 lb

## 2023-04-14 DIAGNOSIS — R42 Dizziness and giddiness: Secondary | ICD-10-CM | POA: Diagnosis not present

## 2023-04-14 DIAGNOSIS — R002 Palpitations: Secondary | ICD-10-CM

## 2023-04-14 MED ORDER — LOSARTAN POTASSIUM 25 MG PO TABS: 12.5000 mg | ORAL_TABLET | Freq: Every day | ORAL | 3 refills | Status: DC

## 2023-04-14 NOTE — Progress Notes (Signed)
Clinical Summary Ms. Forbes is a 42 y.o.female seen today for follow up of the following medical problems.   1.Chest pain/Palpitations   10/2021 heart monitor: rare PACs and PVCs. Min HR 62, Max HR 167, Avg HR 93 - started on lopressor, taking prn. Nervous to take regularly as she at times gets HRs in 40s and 50s on her watch.    10/2021 echo: LVEF 65-70%, no WMAs, normal RV function  - 09/2022 nuclear stress: no ischemia 09/2022 monitor: 30 day monitor, data from 64% of planned monitored time. No significant arrhythmias. Had some issues with monitor, would not stay on.     - symptoms come and go, however overall increasing severity and frequency - watch reports high HRs elevated at times, 187 was the highest. Tends to be triggered with standing - dizziness with standing is ongoing - wears compression stocking, working to stay well hydrated.      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 difficile diarrhea 02/29/2016   Depression    GERD (gastroesophageal reflux disease)    Hypertension    Hypokalemia    Miscarriage    pt. states shes  spotting  and wearing a tampon   Ovarian mass, right 03/01/2016   ? complex cyst   Pregnant    Thyroid disease      Allergies  Allergen Reactions   Alpha-Gal Anaphylaxis   Cinnamon Swelling and Other (See Comments)    REACTION: throat swells   Bee Venom Swelling and Other (See Comments)    REACTION: All over body swelling   Vicodin [Hydrocodone-Acetaminophen] Nausea And Vomiting   Benadryl [Diphenhydramine]     Couldn't talk or function with injectable- pill gives chest pain and hives and SOB   Dilaudid [Hydromorphone Hcl] Hives   Toradol [Ketorolac Tromethamine]     Hives, and  chest pain     Current Outpatient Medications  Medication Sig Dispense Refill   ALPRAZolam (XANAX) 1 MG tablet Take 1 mg by mouth 2 (two) times daily as needed.     buPROPion (WELLBUTRIN XL) 150 MG 24 hr tablet Take 150 mg by mouth every morning.     cholestyramine (QUESTRAN) 4 g packet Take 1 packet (4 g total) by mouth 2 (two) times daily. 60 each 6   cromolyn (GASTROCROM) 100 MG/5ML solution Take by mouth as directed.     Cyanocobalamin (B-12) 1000 MCG SUBL Place under the tongue.     EPINEPHrine (EPIPEN IJ) Inject as directed. As needed     famotidine (PEPCID) 40 MG tablet Take 40 mg by mouth daily.     KETOTIFEN FUMARATE OP Take by mouth as needed.     Levothyroxine Sodium 137 MCG/ML SOLN Take 137 mcg by mouth daily before breakfast. 30 mL 3   losartan (COZAAR) 25 MG tablet Take 1 tablet (25 mg total) by mouth daily. 30 tablet 6   methocarbamol (ROBAXIN) 500 MG tablet Take 1 tablet (500 mg total) by mouth daily as needed for muscle spasms. 15 tablet 0   metoprolol tartrate (LOPRESSOR) 25 MG tablet Take 0.5 tablets (12.5 mg total) by mouth 2 (two) times daily  as needed (palpitations). 30 tablet 2   omeprazole (PRILOSEC) 40 MG capsule Take 40 mg by mouth in the morning and at bedtime.     potassium chloride (KLOR-CON) 20 MEQ packet Take 2 packets by mouth daily.     promethazine (PHENERGAN) 25 MG tablet Take 25 mg by mouth every 8 (eight) hours as needed for nausea or vomiting.     Vitamin D, Ergocalciferol, (DRISDOL) 1.25 MG (50000 UNIT) CAPS capsule Take 1 capsule (50,000 Units total) by mouth 2 (two) times a week. 24 capsule 0   Current Facility-Administered Medications  Medication Dose Route Frequency Provider Last Rate Last Admin   0.9 %  sodium chloride infusion  500 mL Intravenous Once Mansouraty, Netty Starring., MD         Past Surgical History:  Procedure Laterality Date   ABDOMINAL HYSTERECTOMY N/A 09/16/2018   Procedure: HYSTERECTOMY ABDOMINAL;  Surgeon: Lazaro Arms, MD;   Location: [facility];  Service: Gynecology;  Laterality: N/A;   APPENDECTOMY     CESAREAN SECTION     CHOLECYSTECTOMY     CHOLECYSTECTOMY  07/28/2012   Procedure: LAPAROSCOPIC CHOLECYSTECTOMY;  Surgeon: Fabio Bering, MD;  Location: [facility];  Service: General;  Laterality: N/A;   LAPAROSCOPY N/A 09/07/2014   Procedure: LAPAROSCOPY OPERATIVE;  Surgeon: Adam Phenix, MD;  Location: [facility];  Service: Gynecology;  Laterality: N/A;   removal of lt fallopian tube     UNILATERAL SALPINGECTOMY Right 09/07/2014   Procedure: UNILATERAL SALPINGECTOMY;  Surgeon: Adam Phenix, MD;  Location: [facility];  Service: Gynecology;  Laterality: Right;   WOUND DEBRIDEMENT Right 02/28/2016   Procedure: DEBRIDEMENT OF RIGHT SHOULDER SPIDER BITE;  Surgeon: Franky Macho, MD;  Location: [facility];  Service: General;  Laterality: Right;     Allergies  Allergen Reactions   Alpha-Gal Anaphylaxis   Cinnamon Swelling and Other (See Comments)    REACTION: throat swells   Bee Venom Swelling and Other (See Comments)    REACTION: All over body swelling   Vicodin [Hydrocodone-Acetaminophen] Nausea And Vomiting   Benadryl [Diphenhydramine]     Couldn't talk or function with injectable- pill gives chest pain and hives and SOB   Dilaudid [Hydromorphone Hcl] Hives   Toradol [Ketorolac Tromethamine]     Hives, and chest pain      Family History  Problem Relation Age of Onset   Lung cancer Father 21   Colon cancer Neg Hx    Esophageal cancer Neg Hx    Rectal cancer Neg Hx    Stomach cancer Neg Hx    Adrenal disorder Neg Hx    Liver disease Neg Hx    Inflammatory bowel disease Neg Hx    Pancreatic cancer Neg Hx      Social History Ms. Litalien reports that she has quit smoking. Her smoking use included cigarettes. She has a 0.50 pack-year smoking history. She has never been exposed to tobacco smoke. She has never used smokeless tobacco. Ms. Girty reports that she does not currently use alcohol.   Review of  Systems CONSTITUTIONAL: No weight loss, fever, chills, weakness or fatigue.  HEENT: Eyes: No visual loss, blurred vision, double vision or yellow sclerae.No hearing loss, sneezing, congestion, runny nose or sore throat.  SKIN: No rash or itching.  CARDIOVASCULAR: per hpi RESPIRATORY: No shortness of breath, cough or sputum.  GASTROINTESTINAL: No anorexia, nausea, vomiting or diarrhea. No abdominal pain or blood.  GENITOURINARY: No burning on urination, no polyuria NEUROLOGICAL: No headache, dizziness,  syncope, paralysis, ataxia, numbness or tingling in the extremities. No change in bowel or bladder control.  MUSCULOSKELETAL: No muscle, back pain, joint pain or stiffness.  LYMPHATICS: No enlarged nodes. No history of splenectomy.  PSYCHIATRIC: No history of depression or anxiety.  ENDOCRINOLOGIC: No reports of sweating, cold or heat intolerance. No polyuria or polydipsia.  Marland Kitchen   Physical Examination Today's Vitals   04/14/23 1614  BP: 102/60  Pulse: (!) 112  SpO2: 96%  Weight: 266 lb 9.6 oz (120.9 kg)  Height: 5\' 8"  (1.727 m)   Body mass index is 40.54 kg/m.  Gen: resting comfortably, no acute distress HEENT: no scleral icterus, pupils equal round and reactive, no palptable cervical adenopathy,  CV: RRR, no mrg, no jvd Resp: Clear to auscultation bilaterally GI: abdomen is soft, non-tender, non-distended, normal bowel sounds, no hepatosplenomegaly MSK: extremities are warm, no edema.  Skin: warm, no rash Neuro:  no focal deficits Psych: appropriate affect   Diagnostic Studies  10/2021 heart monitor 14 day monitor Rare supraventricular ectopy in the form of isolated PACs and couplets Rare ventricular ectopy in the form of PVCs Triggered events but no reported associated symptoms No significant arrythmias       09/2022 nuclear stress The study is normal. The study is low risk.   No ST deviation was noted.   LV perfusion is normal.   Left ventricular function is  normal. Nuclear stress EF: 69 %. The left ventricular ejection fraction is hyperdynamic (>65%). End diastolic cavity size is normal. End systolic cavity size is normal.   Prior study not available for comparison.   Normal perfusion. LVEF 69% with normal wall motion. This is a low risk study. No prior for comparison.     09/2022 monitor 30 day monitor. Data available from 64% of planned monitored time   Min HR 64, Max HR 160, Avg HR 90   No symptoms reported   Telemetry tracings show sinus rhythm and sinus tachycardia     Assessment and Plan      1. Palpitations/Dizziness - benign recent monitor -long history of symptoms, primarily occur with standing. Prior orthostatics have been borderline but not confirmatiory, at prior visit HR increasd 25 bpm with standing not quite diagnostic for POTs. I think symptoms are very consistent with her being on the spectrum of autonomic dysfunction.   - contineu aggressive hydration, compression stockings - wean losartan to 12.5mg  daily and monitor symptoms, may need to d/c if ongoing symptoms.        Antoine Poche, M.D.

## 2023-04-14 NOTE — Patient Instructions (Addendum)
Medication Instructions:   Decrease Losartan to 12.5mg  daily  Continue all other medications.     Labwork:  none  Testing/Procedures:  none  Follow-Up:  6 months   Any Other Special Instructions Will Be Listed Below (If Applicable).   If you need a refill on your cardiac medications before your next appointment, please call your pharmacy.

## 2023-04-16 LAB — TSH: TSH: 9.51 u[IU]/mL — ABNORMAL HIGH (ref 0.450–4.500)

## 2023-04-16 LAB — T4, FREE: Free T4: 1.04 ng/dL (ref 0.82–1.77)

## 2023-04-21 NOTE — Patient Instructions (Signed)

## 2023-04-22 ENCOUNTER — Encounter: Payer: Self-pay | Admitting: Nurse Practitioner

## 2023-04-22 ENCOUNTER — Ambulatory Visit (INDEPENDENT_AMBULATORY_CARE_PROVIDER_SITE_OTHER): Payer: BC Managed Care – PPO | Admitting: Nurse Practitioner

## 2023-04-22 VITALS — BP 104/70 | HR 98 | Ht 68.0 in | Wt 266.2 lb

## 2023-04-22 DIAGNOSIS — K909 Intestinal malabsorption, unspecified: Secondary | ICD-10-CM

## 2023-04-22 DIAGNOSIS — E063 Autoimmune thyroiditis: Secondary | ICD-10-CM | POA: Diagnosis not present

## 2023-04-22 DIAGNOSIS — E038 Other specified hypothyroidism: Secondary | ICD-10-CM | POA: Diagnosis not present

## 2023-04-22 DIAGNOSIS — K529 Noninfective gastroenteritis and colitis, unspecified: Secondary | ICD-10-CM | POA: Diagnosis not present

## 2023-04-22 MED ORDER — LEVOTHYROXINE SODIUM 150 MCG/5ML PO SOLN: 5.0000 mL | Freq: Every day | ORAL | 0 refills | Status: DC

## 2023-04-22 NOTE — Progress Notes (Signed)
Endocrinology Follow Up Note                                         04/22/2023, 2:31 PM  Subjective:   Subjective    Tempie L Broschart is a 42 y.o.-year-old female patient being seen in follow up after being seen in consultation for hypothyroidism referred by Lianne Moris, PA-C.   Past Medical History:  Diagnosis Date   Allergy    Allergy to alpha-gal    Allergy to alpha-gal    Anxiety    [place] recluse spider bite 02/27/2016   C. difficile diarrhea 02/29/2016   Depression    GERD (gastroesophageal reflux disease)    Hypertension    Hypokalemia    Miscarriage    pt. states shes  spotting  and wearing a tampon   Ovarian mass, right 03/01/2016   ? complex cyst   Pregnant    Thyroid disease     Past Surgical History:  Procedure Laterality Date   ABDOMINAL HYSTERECTOMY N/A 09/16/2018   Procedure: HYSTERECTOMY ABDOMINAL;  Surgeon: Lazaro Arms, MD;  Location: [facility];  Service: Gynecology;  Laterality: N/A;   APPENDECTOMY     CESAREAN SECTION     CHOLECYSTECTOMY     CHOLECYSTECTOMY  07/28/2012   Procedure: LAPAROSCOPIC CHOLECYSTECTOMY;  Surgeon: Fabio Bering, MD;  Location: [facility];  Service: General;  Laterality: N/A;   LAPAROSCOPY N/A 09/07/2014   Procedure: LAPAROSCOPY OPERATIVE;  Surgeon: Adam Phenix, MD;  Location: [facility];  Service: Gynecology;  Laterality: N/A;   removal of lt fallopian tube     UNILATERAL SALPINGECTOMY Right 09/07/2014   Procedure: UNILATERAL SALPINGECTOMY;  Surgeon: Adam Phenix, MD;  Location: [facility];  Service: Gynecology;  Laterality: Right;   WOUND DEBRIDEMENT Right 02/28/2016   Procedure: DEBRIDEMENT OF RIGHT SHOULDER SPIDER BITE;  Surgeon: Franky Macho, MD;  Location: [facility];  Service: General;  Laterality: Right;    Social History   Socioeconomic History   Marital status: Married    Spouse name: Not on file   Number of children: Not on file   Years of education: Not  on file   Highest education level: Not on file  Occupational History   Not on file  Tobacco Use   Smoking status: Former    Packs/day: 0.50    Years: 1.00    Additional pack years: 0.00    Total pack years: 0.50    Types: Cigarettes    Passive exposure: Never   Smokeless tobacco: Never  Vaping Use   Vaping Use: Never used  Substance and Sexual Activity   Alcohol use: Not Currently    Comment: occ.   Drug use: No   Sexual activity: Not Currently    Birth control/protection: Surgical    Comment: hyst  Other Topics Concern   Not on file  Social History Narrative   Not on file   Social Determinants of Health   Financial Resource Strain: Not on file  Food Insecurity:  Not on file  Transportation Needs: Not on file  Physical Activity: Not on file  Stress: Not on file  Social Connections: Not on file    Family History  Problem Relation Age of Onset   Lung cancer Father 57   Colon cancer Neg Hx    Esophageal cancer Neg Hx    Rectal cancer Neg Hx    Stomach cancer Neg Hx    Adrenal disorder Neg Hx    Liver disease Neg Hx    Inflammatory bowel disease Neg Hx    Pancreatic cancer Neg Hx     Outpatient Encounter Medications as of 04/22/2023  Medication Sig   ALPRAZolam (XANAX) 1 MG tablet Take 1 mg by mouth 2 (two) times daily as needed for anxiety or sleep.   buPROPion (WELLBUTRIN XL) 150 MG 24 hr tablet Take 150 mg by mouth every morning.   cholestyramine (QUESTRAN) 4 g packet Take 1 packet (4 g total) by mouth 2 (two) times daily.   cromolyn (GASTROCROM) 100 MG/5ML solution Take by mouth as directed.   Cyanocobalamin 1000 MCG/ML KIT Inject 1,000 mcg as directed once a week. Patient is injecting once a week   EPINEPHrine (EPIPEN IJ) Inject as directed. As needed   ergocalciferol (VITAMIN D2) 1.25 MG (50000 UT) capsule Patient takes 100,000 units by mouth twice weekly   famotidine (PEPCID) 40 MG tablet Take 40 mg by mouth daily.   KETOTIFEN FUMARATE OP Take 1 mg by  mouth as needed (for cells).   Levothyroxine Sodium 150 MCG/5ML SOLN Take 5 mLs by mouth daily before breakfast.   losartan (COZAAR) 25 MG tablet Take 0.5 tablets (12.5 mg total) by mouth daily.   methocarbamol (ROBAXIN) 500 MG tablet Take 1 tablet (500 mg total) by mouth daily as needed for muscle spasms.   metoprolol tartrate (LOPRESSOR) 25 MG tablet Take 0.5 tablets (12.5 mg total) by mouth 2 (two) times daily as needed (palpitations).   omeprazole (PRILOSEC) 40 MG capsule Take 40 mg by mouth in the morning and at bedtime.   potassium chloride (KLOR-CON) 20 MEQ packet Take 2 packets by mouth daily.   promethazine (PHENERGAN) 25 MG tablet Take 25 mg by mouth every 8 (eight) hours as needed for nausea or vomiting.   [DISCONTINUED] Levothyroxine Sodium 137 MCG/ML SOLN Take 137 mcg by mouth daily before breakfast.   Cyanocobalamin (B-12) 1000 MCG SUBL Place 1,000 Units under the tongue once a week. (Patient not taking: Reported on 04/22/2023)   Facility-Administered Encounter Medications as of 04/22/2023  Medication   0.9 %  sodium chloride infusion    ALLERGIES: Allergies  Allergen Reactions   Alpha-Gal Anaphylaxis   Cinnamon Swelling and Other (See Comments)    REACTION: throat swells   Bee Venom Swelling and Other (See Comments)    REACTION: All over body swelling   Vicodin [Hydrocodone-Acetaminophen] Nausea And Vomiting   Benadryl [Diphenhydramine]     Couldn't talk or function with injectable- pill gives chest pain and hives and SOB   Dilaudid [Hydromorphone Hcl] Hives   Toradol [Ketorolac Tromethamine]     Hives, and chest pain   VACCINATION STATUS: Immunization History  Administered Date(s) Administered   Influenza,inj,Quad PF,6+ Mos 02/28/2016   Moderna Sars-Covid-2 Vaccination 02/08/2020, 03/07/2020     HPI   Satoria L Kester is a patient with the above medical history. she was diagnosed with hypothyroidism which required subsequent initiation of thyroid hormone  replacement therapy. she was given various doses of Levothyroxine, branded-Synthroid, and Tirosint  over the years, currently on Tirosint 100 micrograms. she reports compliance to this medication:  Taking it daily on empty stomach with water, separated by >30 minutes before breakfast and other medications, and by at least 4 hours from calcium, iron, PPIs, multivitamins.  She notes she is taking cholestyramine daily to help with absorption problems.  She describes her disease course as starting out as diarrhea which came after every meal or liquid.  Then she was diagnosed with alpha-gal about 2 years ago and then, slowly she started to develop other symptoms, dizziness, abdominal cramping, headaches, near syncopal episodes.  She has seen many different specialists, was being worked up for POTS but wanted to have her thyroid managed first at it may be affecting her other symptoms.  Since last visit, she has seen Rheumatology who initiated referral to neurology due to high inflammation markers.  She never heard back from GI for a second opinion.  She also went to [facility] who is working her up for mold issues.   I reviewed patient's thyroid tests:  Lab Results  Component Value Date   TSH 9.510 (H) 04/15/2023   TSH 10.300 (H) 02/11/2023   TSH 24.300 (H) 12/12/2022   TSH 6.420 (H) 10/08/2022   TSH 17.10 (A) 08/17/2022   TSH 11.70 (A) 05/23/2022   TSH 15.20 (A) 04/10/2022   TSH 18.49 (H) 06/22/2021   TSH 3.56 03/07/2021   FREET4 1.04 04/15/2023   FREET4 0.90 02/11/2023   FREET4 0.87 12/12/2022   FREET4 1.11 10/08/2022   FREET4 0.90 03/07/2021    Pt denies feeling nodules in neck, hoarseness, dysphagia/odynophagia, SOB with lying down.  she does not know of family history of thyroid disorders including cancer.  No history of radiation therapy to head or neck.  No recent use of iodine supplements.  Denies use of Biotin containing supplements.  She has had thyroid ultrasound  in August 2022 which was normal with exception of noting cell heterogeneity.  I reviewed her chart and she also has a history of GERD, tobacco abuse, malabsorption issues.   ROS:  Constitutional: + steady, + fatigue, no subjective hyperthermia, no subjective hypothermia, chronic headache Eyes: no blurry vision, no xerophthalmia ENT: no sore throat, no nodules palpated in throat, no dysphagia/odynophagia, no hoarseness Cardiovascular: no chest pain, no SOB, + intermittent palpitations, no leg swelling, dizziness with position changes (being worked up for POTS) Respiratory: no cough, no SOB Gastrointestinal: no nausea/vomiting, + chronic diarrhea triggered by any oral intake Musculoskeletal: no muscle/joint aches, intermittent diffuse muscle spasms-worsening- ongoing eval with neurology Skin: no rashes Neurological: no tremors, no numbness, no tingling, + intermittent dizziness Psychiatric: no depression, no anxiety   Objective:   Objective     BP 104/70 (BP Location: Right Arm, Patient Position: Sitting, Cuff Size: Large)   Pulse 98   Ht 5\' 8"  (1.727 m)   Wt 266 lb 3.2 oz (120.7 kg)   LMP 04/11/2017 (Exact Date) Comment: has had continual periods  BMI 40.48 kg/m  Wt Readings from Last 3 Encounters:  04/22/23 266 lb 3.2 oz (120.7 kg)  04/14/23 266 lb 9.6 oz (120.9 kg)  04/01/23 273 lb (123.8 kg)    BP Readings from Last 3 Encounters:  04/22/23 104/70  04/14/23 102/60  04/01/23 132/82      Physical Exam- Limited  Constitutional:  Body mass index is 40.48 kg/m. , not in acute distress, normal state of mind Eyes:  EOMI, no exophthalmos Musculoskeletal: no gross deformities, strength intact in  all four extremities, no gross restriction of joint movements Skin:  no rashes, no hyperemia Neurological: no tremor with outstretched hands    CMP ( most recent) CMP     Component Value Date/Time   NA 142 12/25/2022 1150   NA 142 07/26/2022 1115   K 4.0 12/25/2022 1150    CL 107 12/25/2022 1150   CO2 26 12/25/2022 1150   GLUCOSE 83 12/25/2022 1150   BUN 8 12/25/2022 1150   BUN 8 07/26/2022 1115   CREATININE 0.83 12/25/2022 1150   CALCIUM 9.2 12/25/2022 1150   PROT 6.8 12/25/2022 1150   PROT 6.6 12/25/2022 1150   PROT 6.0 09/28/2020 1443   ALBUMIN 4.2 06/22/2021 1125   AST 26 12/25/2022 1150   ALT 40 (H) 12/25/2022 1150   ALKPHOS 83 06/22/2021 1125   BILITOT 0.4 12/25/2022 1150   GFRNONAA >60 11/10/2020 1622   GFRAA >60 09/17/2018 0525     Diabetic Labs (most recent): No results found for: "HGBA1C", "MICROALBUR"   Lipid Panel ( most recent) Lipid Panel  No results found for: "CHOL", "TRIG", "HDL", "CHOLHDL", "VLDL", "LDLCALC", "LDLDIRECT", "LABVLDL"     Lab Results  Component Value Date   TSH 9.510 (H) 04/15/2023   TSH 10.300 (H) 02/11/2023   TSH 24.300 (H) 12/12/2022   TSH 6.420 (H) 10/08/2022   TSH 17.10 (A) 08/17/2022   TSH 11.70 (A) 05/23/2022   TSH 15.20 (A) 04/10/2022   TSH 18.49 (H) 06/22/2021   TSH 3.56 03/07/2021   FREET4 1.04 04/15/2023   FREET4 0.90 02/11/2023   FREET4 0.87 12/12/2022   FREET4 1.11 10/08/2022   FREET4 0.90 03/07/2021      Thyroid US from 07/30/22 US Thyroid  Anatomical Region Laterality Modality  Neck -- Ultrasound   Impression  Thyroid heterogeneity without discrete nodule.  The above is in keeping with the ACR TI-RADS recommendations - J Am Coll Radiol 2017;14:587-595.   Electronically Signed   By: Judie Petit.  Shick M.D.   On: 07/30/2021 13:33 Narrative  CLINICAL DATA:  Palpable left thyroid nodule, neck fullness  EXAM: THYROID ULTRASOUND  TECHNIQUE: Ultrasound examination of the thyroid gland and adjacent soft tissues was performed.  COMPARISON:  None.  FINDINGS: Parenchymal Echotexture: Moderately heterogenous  Isthmus: 3 mm  Right lobe: 4.3 x 1.8 x 1.3 cm  Left lobe: 3.6 x 1.8 x 1.3 cm  _________________________________________________________  Estimated total number of  nodules >/= 1 cm: 0  Number of spongiform nodules >/=  2 cm not described below (TR1): 0  Number of mixed cystic and solid nodules >/= 1.5 cm not described below (TR2): 0  _________________________________________________________  Nonspecific thyroid heterogeneity suggesting medical thyroid disease. No hypervascularity or nodule. No adenopathy. Procedure Note  Sherren Mocha, MD - 07/30/2021 Formatting of this note might be different from the original. CLINICAL DATA:  Palpable left thyroid nodule, neck fullness  EXAM: THYROID ULTRASOUND  TECHNIQUE: Ultrasound examination of the thyroid gland and adjacent soft tissues was performed.  COMPARISON:  None.  FINDINGS: Parenchymal Echotexture: Moderately heterogenous  Isthmus: 3 mm  Right lobe: 4.3 x 1.8 x 1.3 cm  Left lobe: 3.6 x 1.8 x 1.3 cm  _________________________________________________________  Estimated total number of nodules >/= 1 cm: 0  Number of spongiform nodules >/=  2 cm not described below (TR1): 0  Number of mixed cystic and solid nodules >/= 1.5 cm not described below (TR2): 0  _________________________________________________________  Nonspecific thyroid heterogeneity suggesting medical thyroid disease. No hypervascularity or nodule. No adenopathy.  IMPRESSION: Thyroid heterogeneity without discrete nodule.  The above is in keeping with the ACR TI-RADS recommendations - J Am Coll Radiol 2017;14:587-595.   Electronically Signed   By: Judie Petit.  Shick M.D.   On: 07/30/2021 13:33 Exam End: 07/30/21 13:32   Specimen Collected: 07/30/21 13:32 Last Resulted: 07/30/21 13:33  Received From: [facility]      Latest Reference Range & Units 10/08/22 13:57 12/12/22 16:18 02/11/23 15:53 04/15/23 16:40  TSH 0.450 - 4.500 uIU/mL 6.420 (H) 24.300 (H) 10.300 (H) 9.510 (H)  Triiodothyronine,Free,Serum 2.0 - 4.4 pg/mL 2.5     T4,Free(Direct) 0.82 - 1.77 ng/dL 1.61 0.96 0.45 4.09  Thyroperoxidase Ab  SerPl-aCnc 0 - 34 IU/mL 160 (H)     Thyroglobulin Antibody 0.0 - 0.9 IU/mL 2.2 (H)     (H): Data is abnormally high   Assessment & Plan:   ASSESSMENT / PLAN:  1. Hypothyroidism-r/t Hashimotos thyroiditis   Patient with long-standing hypothyroidism, on thyroid hormone replacement therapy. On physical exam, patient does not have gross goiter, thyroid nodules, or neck compression symptoms.  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  They have improved slightly, possibly indicating she is tolerating the solution better?  I will increase her Levothyroxine solution to 150 mcg po daily before breakfast.   - We discussed about correct intake of levothyroxine, at fasting, with water, separated by at least 30 minutes from breakfast, and separated by more than 4 hours from calcium, iron, multivitamins, acid reflux medications (PPIs). -Patient is made aware of the fact that thyroid hormone replacement is needed for life, dose to be adjusted by periodic monitoring of thyroid function tests.    -Due to absence of clinical goiter and essentially normal [place] from a year ago, no need for additional thyroid ultrasound at this time.  She has follow up with Rheumatology coming up and also neurology as well.  She will be seeing new GI soon, says the referral never went through the first time because she was already established with someone else and it wasn't worded as "transfer of care."  She requested me to fill out handicap placard form for her today.  I told her this is something that her PCP would manage as our office does not complete this.    I spent  31  minutes in the care of the patient today including review of labs from Thyroid Function, CMP, and other relevant labs ; imaging/biopsy records (current and previous including abstractions from other  facilities); face-to-face time discussing  her lab results and symptoms, medications doses, her options of short and long term treatment based on the latest standards of care / guidelines;   and documenting the encounter.  Suzette Battiest  participated in the discussions, expressed understanding, and voiced agreement with the above plans.  All questions were answered to her satisfaction. she is encouraged to contact clinic should she have any questions or concerns prior to her return visit.   FOLLOW UP PLAN:  Return in about 3 months (around 07/23/2023) for Thyroid follow up, Previsit labs.  Ronny Bacon, [facility] [address] [place], [place] [address] Phone: [phone] Fax: [phone]  04/22/2023, 2:31 PM

## 2023-04-25 ENCOUNTER — Encounter: Payer: Self-pay | Admitting: *Deleted

## 2023-05-12 ENCOUNTER — Telehealth: Payer: Self-pay | Admitting: *Deleted

## 2023-05-12 NOTE — Telephone Encounter (Signed)
Patient left a voicemail, she states that at her recent office visit her thyroid medication was increased from 137 mcg to 150 mcg. The new dose of 150 mcg caused her to have really bad palpations , she had to call EMS. Patient went back to the 137 mcg until her next appointment. She is asking if she can have a new prescription of the 137 mcg sent to her pharmacy, she says any different doses will require a PA.

## 2023-05-14 ENCOUNTER — Telehealth: Payer: Self-pay | Admitting: *Deleted

## 2023-05-14 MED ORDER — LEVOTHYROXINE SODIUM 137 MCG/ML PO SOLN: 137.0000 ug | Freq: Every day | ORAL | 3 refills | Status: DC

## 2023-05-14 NOTE — Telephone Encounter (Signed)
Patient left a voicemail, she states that at her recent office visit her thyroid medication was increased from 137 mcg to 150 mcg. The new dose of 150 mcg caused her to have really bad palpations , she had to call EMS. Patient went back to the 137 mcg until her next appointment. She is asking if she can have a new prescription of the 137 mcg sent to her pharmacy, she says any different doses will require a PA.

## 2023-05-14 NOTE — Telephone Encounter (Signed)
Patient was called and made aware. 

## 2023-05-14 NOTE — Telephone Encounter (Signed)
I sent in the script for 137 mcg solution to her pharmacy on file.  This was the first time seeing this message. Not sure what happened.

## 2023-05-22 ENCOUNTER — Ambulatory Visit (INDEPENDENT_AMBULATORY_CARE_PROVIDER_SITE_OTHER): Payer: Medicaid Other | Admitting: Diagnostic Neuroimaging

## 2023-05-22 ENCOUNTER — Ambulatory Visit (INDEPENDENT_AMBULATORY_CARE_PROVIDER_SITE_OTHER): Payer: Self-pay | Admitting: Diagnostic Neuroimaging

## 2023-05-22 DIAGNOSIS — M62838 Other muscle spasm: Secondary | ICD-10-CM

## 2023-05-22 DIAGNOSIS — Z0289 Encounter for other administrative examinations: Secondary | ICD-10-CM

## 2023-05-23 LAB — CK: Total CK: 192 U/L — ABNORMAL HIGH (ref 32–182)

## 2023-05-24 LAB — ALDOLASE: Aldolase: 6.9 U/L (ref 3.3–10.3)

## 2023-05-27 NOTE — Procedures (Signed)
[facility]  NCS (NERVE CONDUCTION STUDY) WITH EMG (ELECTROMYOGRAPHY) REPORT   STUDY DATE: 05/22/23 PATIENT NAME: Gwendolyn Glass DOB: [birth year] MRN: [id number]  ORDERING CLINICIAN: Joycelyn Schmid, MD   TECHNOLOGIST: Jenelle Mages ELECTROMYOGRAPHER: Glenford Bayley. Eddis Pingleton, MD  CLINICAL INFORMATION: 42 year old female with muscle cramps and mild elevated CK level.  FINDINGS: NERVE CONDUCTION STUDY:  Bilateral tibial and peroneal motor responses are normal.  Bilateral tibial F wave latencies are normal.  Bilateral sural and superficial peroneal sensory sponsors are normal.   NEEDLE ELECTROMYOGRAPHY:  Needle examination of right upper and lower extremities is normal.   IMPRESSION:   Normal study.  No electrodiagnostic evidence of large fiber neuropathy or myopathy at this time.   INTERPRETING PHYSICIAN:  Suanne Marker, MD Certified in Neurology, Neurophysiology and Neuroimaging  [facility] [address] [place], [place] [address] [phone]   [facility]    Nerve / Sites Muscle Latency Ref. Amplitude Ref. Rel Amp Segments Distance Velocity Ref. Area    ms ms mV mV %  cm m/s m/s mVms  R Peroneal - EDB     Ankle EDB 4.4 ?6.5 10.5 ?2.0 100 Ankle - EDB 9   34.4     Fib head EDB 9.8  10.1  96 Fib head - Ankle 26 48 ?44 32.1     Pop fossa EDB 11.7  10.6  105 Pop fossa - Fib head 11 56 ?44 34.6         Pop fossa - Ankle      L Peroneal - EDB     Ankle EDB 4.6 ?6.5 11.3 ?2.0 100 Ankle - EDB 9   34.6     Fib head EDB 9.8  11.5  102 Fib head - Ankle 27 51 ?44 34.4     Pop fossa EDB 11.8  11.2  97.3 Pop fossa - Fib head 11.6 58 ?44 33.8         Pop fossa - Ankle      R Tibial - AH     Ankle AH 3.8 ?5.8 13.1 ?4.0 100 Ankle - AH 9   34.3     Pop fossa AH 10.9  11.0  84.1 Pop fossa - Ankle 36 51 ?41 33.6  L Tibial - AH     Ankle AH 4.6 ?5.8 11.0 ?4.0 100 Ankle - AH 9   22.8     Pop fossa AH 11.5  7.4  67.8 Pop fossa - Ankle 35  50 ?41 16.4             SNC    Nerve / Sites Rec. Site Peak Lat Ref.  Amp Ref. Segments Distance    ms ms V V  cm  R Sural - Ankle (Calf)     Calf Ankle 3.8 ?4.4 14 ?6 Calf - Ankle 14  L Sural - Ankle (Calf)     Calf Ankle 4.1 ?4.4 14 ?6 Calf - Ankle 14  R Superficial peroneal - Ankle     Lat leg Ankle 3.7 ?4.4 10 ?6 Lat leg - Ankle 14  L Superficial peroneal - Ankle     Lat leg Ankle 4.0 ?4.4 6 ?6 Lat leg - Ankle 14             F  Wave    Nerve F Lat Ref.   ms ms  R Tibial - AH 46.4 ?56.0  L Tibial - AH 47.8 ?56.0  EMG Summary Table    Spontaneous MUAP Recruitment  Muscle IA Fib PSW Fasc Other Amp Dur. Poly Pattern  R. Vastus medialis Normal None None None _______ Normal Normal Normal Normal  R. Tibialis anterior Normal None None None _______ Normal Normal Normal Normal  R. Gastrocnemius (Medial head) Normal rare rare Rare _______ Normal Normal Normal Normal  R. Iliopsoas Normal None None None _______ Normal Normal Normal Normal  R. Lumbar paraspinals Normal None None None _______ Normal Normal Normal Normal  R. Deltoid Normal None None None _______ Normal Normal Normal Normal  R. Biceps brachii Increased None None None _______ Normal Normal Normal Normal  R. Triceps brachii Normal None None None _______ Normal Normal Normal Normal  R. Flexor carpi radialis Normal None None None _______ Normal Normal Normal Normal  R. First dorsal interosseous Normal None None None _______ Normal Normal Normal Normal  R. Cervical paraspinals Normal None None None _______ Normal Normal Normal Normal

## 2023-06-06 ENCOUNTER — Ambulatory Visit (INDEPENDENT_AMBULATORY_CARE_PROVIDER_SITE_OTHER): Payer: BC Managed Care – PPO | Admitting: Gastroenterology

## 2023-06-06 ENCOUNTER — Encounter (INDEPENDENT_AMBULATORY_CARE_PROVIDER_SITE_OTHER): Payer: Self-pay | Admitting: Gastroenterology

## 2023-06-06 VITALS — BP 117/84 | HR 90 | Temp 98.3°F | Ht 68.0 in | Wt 265.9 lb

## 2023-06-06 DIAGNOSIS — K529 Noninfective gastroenteritis and colitis, unspecified: Secondary | ICD-10-CM

## 2023-06-06 DIAGNOSIS — R1084 Generalized abdominal pain: Secondary | ICD-10-CM

## 2023-06-06 MED ORDER — PSYLLIUM 58.6 % PO PACK: 1.0000 | PACK | Freq: Three times a day (TID) | ORAL | 12 refills | Status: AC

## 2023-06-06 NOTE — Progress Notes (Signed)
[name] , M.D. Gastroenterology & Hepatology [facility] Gastroenterology [address] [place], [place] [address] Primary Care Physician: Lianne Moris, PA-C [address] [place] [address]  Chief Complaint:  Chronic diarrhea , abdominal pain   History of Present Illness:  Gwendolyn Glass is a 42 y.o. female with Hashimoto's thyroiditis , alpha gal syndrome, polyarthralgia, prior C. difficile after antibiotics exposure,  cholecystectomy, appendectomy who presents for evaluation of chronic diarrhea and abdominal pain  Patient has a complex history .  Patient reports that she was diagnosed with alpha gal syndrome after tick bite and is not able to tolerate any mammal products, she does not eat dairy products or red meat.  She has been suffering for diarrhea for over 10 years and he has been worse in the past 3 years.  She reports that she will have large amount of liquid stool in the morning and as the day progresses wanting to get smaller, but she would have around 10 bowel movements daily.  This is affecting her life that she is not able to have a social circle and able to leave the house.  Patient denies any blood in stool.  Patient would wake up in the middle of the night to defecate as well.  She was having abdominal discomfort most of the day worsened with defecation.  Patient reports that when she was diagnosed with alpha gal syndrome she stopped eating and lost 30 pounds at that time but recently she has been gaining weight  She has significant surgical history as well with appendectomy cholecystectomy tubal ectopic pregnancy with removal of fallopian tubes and a hysterectomy because of dysfunctional uterine bleeding  She also reports that most of the day she would hear her abdomen growling and making noises.  The patient denies having any nausea, vomiting, fever, chills, hematochezia, melena, hematemesis, abdominal distention, abdominal pain,  diarrhea, jaundice, pruritus or weight loss.    Patient had upper endoscopy and colonoscopy(suboptimal prep) with biopsies negative for celiac H. pylori and microscopic colitis   Last [id number]  Impression: - No gross lesions in esophagus proximally. Biopsied. Salmon- colored mucosa suspicious for short- segment Barrett' s esophagus distally. Biopsied to rule in/ out. - Gastritis in antrum/ prepylorus. Discoloration noted in the body to antrum transition. No other gross lesions in the stomach. Biopsied. - No gross lesions in the duodenal bulb, in the first portion of the duodenum and in the second portion of the duodenum. Biopsied.  Last Colonoscopy:2021  Impression: - Preparation of the colon was fair after copious lavage but not adequate for appropriate screening. - Perianal skin tags found on perianal exam. Hemorrhoids found on digital rectal exam. - Stool in the entire examined colon - Lavaged. - The examined portion of the ileum was normal. Biopsied. - Normal mucosa in the entire examined colon when able to be visualized. Biopsied. - Two 2 to 5 mm polyps in the rectum, removed with a cold snare. Resected and retrieved. - Anal papilla( e) were hypertrophied. Non- bleeding non- thrombosed external and internal hemorrhoids.  - Repeat colonoscopy within 1 year because the bowel preparation was suboptimal ( 2- day preparation necessary) this is critical especially if polyps removed return adenomatous.  Diagnosis 1. Surgical [P], duodenal - BENIGN SMALL BOWEL MUCOSA. - NO VILLOUS BLUNTING OR INCREASE IN INTRAEPITHELIAL LYMPHOCYTES. - NO DYSPLASIA OR MALIGNANCY. 2. Surgical [P], gastric antrum - MILD REACTIVE GASTROPATHY WITH EROSIONS. [place] IS NEGATIVE FOR HELICOBACTER PYLORI. - NO  INTESTINAL METAPLASIA, DYSPLASIA, OR MALIGNANCY. 3. Surgical [P], distal esophagus - REFLUX CHANGES. - NO INTESTINAL METAPLASIA, DYSPLASIA, OR MALIGNANCY. 4. Surgical [P], esophagus, random sites -  BENIGN SQUAMOUS MUCOSA. - NO INCREASE IN INTRAEPITHELIAL EOSINOPHILS. - NO INTESTINAL METAPLASIA, DYSPLASIA, OR MALIGNANCY. 5. Surgical [P], small bowel, terminal ileum (normal looking) - BENIGN SMALL BOWEL MUCOSA. - NO ACTIVE INFLAMMATION. - NO DYSPLASIA OR MALIGNANCY. 6. Surgical [P], colon, random sites - BENIGN COLONIC MUCOSA. - NO ACTIVE INFLAMMATION OR EVIDENCE OF MICROSCOPIC COLITIS. - NO DYSPLASIA OR MALIGNANCY. 7. Surgical [P], colon, rectum, polyp (2) - HYPERPLASTIC POLYP (X2 FRAGMENTS). - NO DYSPLASIA OR MALIGNANCY    FHx: neg for any gastrointestinal/liver disease, no malignancies Social: Ex-smoker ,no alcohol or illicit drug use  Past Medical History: Past Medical History:  Diagnosis Date   Allergy    Allergy to alpha-gal    Allergy to alpha-gal    Anxiety    Piedra recluse spider bite 02/27/2016   C. difficile diarrhea 02/29/2016   Depression    GERD (gastroesophageal reflux disease)    Hypertension    Hypokalemia    Miscarriage    pt. states shes  spotting  and wearing a tampon   Ovarian mass, right 03/01/2016   ? complex cyst   Pregnant    Thyroid disease     Past Surgical History: Past Surgical History:  Procedure Laterality Date   ABDOMINAL HYSTERECTOMY N/A 09/16/2018   Procedure: HYSTERECTOMY ABDOMINAL;  Surgeon: Lazaro Arms, MD;  Location: [facility];  Service: Gynecology;  Laterality: N/A;   APPENDECTOMY     CESAREAN SECTION     CHOLECYSTECTOMY     CHOLECYSTECTOMY  07/28/2012   Procedure: LAPAROSCOPIC CHOLECYSTECTOMY;  Surgeon: Fabio Bering, MD;  Location: [facility];  Service: General;  Laterality: N/A;   LAPAROSCOPY N/A 09/07/2014   Procedure: LAPAROSCOPY OPERATIVE;  Surgeon: Adam Phenix, MD;  Location: [facility];  Service: Gynecology;  Laterality: N/A;   removal of lt fallopian tube     UNILATERAL SALPINGECTOMY Right 09/07/2014   Procedure: UNILATERAL SALPINGECTOMY;  Surgeon: Adam Phenix, MD;  Location: [facility];  Service: Gynecology;   Laterality: Right;   WOUND DEBRIDEMENT Right 02/28/2016   Procedure: DEBRIDEMENT OF RIGHT SHOULDER SPIDER BITE;  Surgeon: Franky Macho, MD;  Location: [facility];  Service: General;  Laterality: Right;    Family History: Family History  Problem Relation Age of Onset   Lung cancer Father 35   Colon cancer Neg Hx    Esophageal cancer Neg Hx    Rectal cancer Neg Hx    Stomach cancer Neg Hx    Adrenal disorder Neg Hx    Liver disease Neg Hx    Inflammatory bowel disease Neg Hx    Pancreatic cancer Neg Hx     Social History: Social History   Tobacco Use  Smoking Status Former   Packs/day: 0.50   Years: 1.00   Additional pack years: 0.00   Total pack years: 0.50   Types: Cigarettes   Passive exposure: Past  Smokeless Tobacco Never   Social History   Substance and Sexual Activity  Alcohol Use Not Currently   Social History   Substance and Sexual Activity  Drug Use No    Allergies: Allergies  Allergen Reactions   Alpha-Gal Anaphylaxis   Cinnamon Swelling and Other (See Comments)    REACTION: throat swells   Bee Venom Swelling and Other (See Comments)    REACTION: All over body swelling   Vicodin [Hydrocodone-Acetaminophen]  Nausea And Vomiting   Benadryl [Diphenhydramine]     Couldn't talk or function with injectable- pill gives chest pain and hives and SOB   Dilaudid [Hydromorphone Hcl] Hives   Toradol [Ketorolac Tromethamine]     Hives, and chest pain    Medications: Current Outpatient Medications  Medication Sig Dispense Refill   ALPRAZolam (XANAX) 1 MG tablet Take 1 mg by mouth 2 (two) times daily as needed for anxiety or sleep.     buPROPion (WELLBUTRIN XL) 150 MG 24 hr tablet Take 150 mg by mouth every morning.     cholestyramine (QUESTRAN) 4 g packet Take 1 packet (4 g total) by mouth 2 (two) times daily. 60 each 6   cromolyn (GASTROCROM) 100 MG/5ML solution Take by mouth as directed. Takes 8 liquid bottles per day     Cyanocobalamin 1000 MCG/ML KIT Inject  1,000 mcg as directed once a week. Patient is injecting once a week     EPINEPHrine (EPIPEN IJ) Inject as directed. As needed     ergocalciferol (VITAMIN D2) 1.25 MG (50000 UT) capsule 50,000 Units. Patient takes 50,000 units by mouth twice weekly     famotidine (PEPCID) 40 MG tablet Take 40 mg by mouth daily.     KETOTIFEN FUMARATE OP Take 1 mg by mouth as needed (for cells).     Levothyroxine Sodium 137 MCG/ML SOLN Take 137 mcg by mouth daily before breakfast. 30 mL 3   losartan (COZAAR) 25 MG tablet Take 0.5 tablets (12.5 mg total) by mouth daily. 45 tablet 3   metoprolol tartrate (LOPRESSOR) 25 MG tablet Take 0.5 tablets (12.5 mg total) by mouth 2 (two) times daily as needed (palpitations). 30 tablet 2   omeprazole (PRILOSEC) 40 MG capsule Take 40 mg by mouth in the morning and at bedtime.     potassium chloride (KLOR-CON) 20 MEQ packet Take 2 packets by mouth daily.     promethazine (PHENERGAN) 25 MG tablet Take 25 mg by mouth every 8 (eight) hours as needed for nausea or vomiting.     psyllium (METAMUCIL) 58.6 % packet Take 1 packet by mouth 3 (three) times daily. 30 each 12   Current Facility-Administered Medications  Medication Dose Route Frequency Provider Last Rate Last Admin   0.9 %  sodium chloride infusion  500 mL Intravenous Once Mansouraty, Netty Starring., MD        Review of Systems: GENERAL: negative for malaise, night sweats HEENT: No changes in hearing or vision, no nose bleeds or other nasal problems. NECK: Negative for lumps, goiter, pain and significant neck swelling RESPIRATORY: Negative for cough, wheezing CARDIOVASCULAR: Negative for chest pain, leg swelling, palpitations, orthopnea GI: SEE HPI SKIN: Negative for lesions, rash HEMATOLOGY Negative for prolonged bleeding, bruising easily, and swollen nodes. ENDOCRINE: Negative for cold or heat intolerance, polyuria, polydipsia and goiter. NEURO: negative for tremor, gait imbalance, syncope and seizures. The remainder  of the review of systems is noncontributory.   Physical Exam: BP 117/84 (BP Location: Left Arm, Patient Position: Sitting, Cuff Size: Large)   Pulse 90   Temp 98.3 F (36.8 C) (Oral)   Ht 5\' 8"  (1.727 m)   Wt 265 lb 14.4 oz (120.6 kg)   LMP 04/11/2017 (Exact Date) Comment: has had continual periods  BMI 40.43 kg/m  GENERAL: The patient is AO x3, in no acute distress. HEENT: Head is normocephalic and atraumatic. EOMI are intact. Mouth is well hydrated and without lesions. NECK: Supple. No masses LUNGS: Clear to auscultation. No  presence of rhonchi/wheezing/rales. Adequate chest expansion HEART: RRR, normal s1 and s2. ABDOMEN: Soft, nontender, no guarding, no peritoneal signs, and nondistended. BS +. No masses. EXTREMITIES: Without any cyanosis, clubbing, rash, lesions or edema. NEUROLOGIC: AOx3, no focal motor deficit. SKIN: no jaundice, no rashes   Imaging/Labs: as above  I personally reviewed and interpreted the available labs, imaging and endoscopic files.  Impression and Plan:  Gwendolyn Glass is a 42 y.o. female with Hashimoto's thyroiditis , alpha gal syndrome, polyarthralgia, prior C. difficile after antibiotics exposure , cholecystectomy, appendectomy who presents for evaluation of chronic diarrhea and abdominal pain  #Chronic diarrhea  Patient has more than 3 loose stools for more than 4 weeks hence qualifies for defination of chronic diarrhea  This could be secondary to secretory , osmotic ,functional ,malabsorptive or inflammatory diarrhea  Patient has chronic diarrhea for over 10 years with significant strain on her social life.  Has been previously evaluated by another gastroenterologist with extent of workup including upper endoscopy and colonoscopy with biopsies negative for H. pylori celiac and microscopic colitis  Stool studies has been negative for Entamoeba Giardia and negative GI PCR Negative CRP also goes against inflammatory diarrhea  Given  malabsorption her fecal elastase was also negative  Patient has tried Metamucil previously takes Imodium daily in the morning and cholestyramine twice a day.  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  Reports low vitamin B12 and is on vitamin B supplements.  We do not have any documentation of low vitamin B 12 in her chart.  H. Pylori nfection has been shown to be positive antiparietal antibody and also had vitamin B12 deficiency although biopsies were negative for H. pylori . patient has been on PPI for a long time, which can give a false negative results for H. pylori  She is hypothyroid and her TSH remains elevated reports unable to absorb thyroxine, would recommend following up with endocrinologist.  Although patient has gained weight which is reassuring in her case as it gives a indication she is able to absorb nutrition.  Recommendation:  Hold PPI for 2 weeks and obtain H. pylori stool antigen  Will obtain stool electrolytes and osmolality to calculate stool osmolar gap to distinguish  between secretory and osmotic diarrhea  Recommended Metamucil starting 1 scoop daily for 1 week followed by 2 scoops daily second week, and 3 scoops daily thereafter, to bulk up stool  -Will check 5-HIAA, VIP, glucagon, gastrin.   -In future we will consider carbohydrate breath test to evaluate for  SIBO and measure hydrogen and methane gases in exhaled breath samples   -Will obtain  serum C4 (7a-hydroxy-4- cholesten-3-one) to evaluate for Bile Acid diarrhea   -In future may try Colesevelam 625 mg tav, 3-6 tab daily instead of cholestyramine   -Will consider capsule endoscopy in future.  -Previous colonoscopy was suboptimal prep and was recommended to repeat in 1 year may consider repeating in future  All questions were answered.      [name], MD Gastroenterology and Hepatology [facility] Gastroenterology

## 2023-06-06 NOTE — Patient Instructions (Signed)
It was very nice to meet you today, as dicussed with will plan for the following :  1) Blood work and Stool studies after 14 days of holding omeprazole or pantoprazole 2) Metamucil three times daily

## 2023-06-17 ENCOUNTER — Encounter: Payer: Self-pay | Admitting: Diagnostic Neuroimaging

## 2023-06-17 ENCOUNTER — Ambulatory Visit (INDEPENDENT_AMBULATORY_CARE_PROVIDER_SITE_OTHER): Payer: BC Managed Care – PPO | Admitting: Gastroenterology

## 2023-06-18 ENCOUNTER — Encounter: Payer: Self-pay | Admitting: Diagnostic Neuroimaging

## 2023-06-19 ENCOUNTER — Other Ambulatory Visit: Payer: BC Managed Care – PPO

## 2023-06-19 ENCOUNTER — Other Ambulatory Visit (INDEPENDENT_AMBULATORY_CARE_PROVIDER_SITE_OTHER): Payer: Self-pay | Admitting: Gastroenterology

## 2023-06-20 LAB — POTASSIUM, STOOL

## 2023-06-21 ENCOUNTER — Ambulatory Visit
Admission: RE | Admit: 2023-06-21 | Discharge: 2023-06-21 | Disposition: A | Discharge status: Home / Self Care | Payer: BC Managed Care – PPO | Source: Ambulatory Visit | Attending: Diagnostic Neuroimaging | Admitting: Diagnostic Neuroimaging

## 2023-06-21 DIAGNOSIS — M62838 Other muscle spasm: Secondary | ICD-10-CM | POA: Diagnosis not present

## 2023-06-21 LAB — CALPROTECTIN, FECAL

## 2023-06-21 LAB — H. PYLORI ANTIGEN, STOOL: H pylori Ag, Stl: NEGATIVE

## 2023-06-21 MED ORDER — GADOPICLENOL 0.5 MMOL/ML IV SOLN
10.0000 mL | Freq: Once | INTRAVENOUS | Status: AC | PRN
  Administered 2023-06-21: 10 mL via INTRAVENOUS

## 2023-06-25 LAB — SODIUM, STOOL

## 2023-06-25 LAB — 7ALPHAC4

## 2023-06-25 LAB — 5-HIAA, PLASMA: 5-HIAA, Plasma: 7.9 ng/mL

## 2023-06-25 LAB — OSMOLALITY, STOOL

## 2023-06-25 LAB — GASTRIN: Gastrin: 30 pg/mL (ref 0–115)

## 2023-06-25 LAB — GLUCAGON

## 2023-06-25 LAB — VASOACTIVE INTESTINAL PEPTIDE (VIP)

## 2023-07-08 ENCOUNTER — Ambulatory Visit (INDEPENDENT_AMBULATORY_CARE_PROVIDER_SITE_OTHER): Payer: BC Managed Care – PPO | Admitting: Gastroenterology

## 2023-07-08 ENCOUNTER — Encounter (INDEPENDENT_AMBULATORY_CARE_PROVIDER_SITE_OTHER): Payer: Self-pay | Admitting: Gastroenterology

## 2023-07-08 ENCOUNTER — Encounter (INDEPENDENT_AMBULATORY_CARE_PROVIDER_SITE_OTHER): Payer: Self-pay

## 2023-07-08 VITALS — BP 112/79 | HR 99 | Temp 97.8°F | Ht 68.0 in | Wt 263.8 lb

## 2023-07-08 DIAGNOSIS — Z9049 Acquired absence of other specified parts of digestive tract: Secondary | ICD-10-CM | POA: Diagnosis not present

## 2023-07-08 DIAGNOSIS — K529 Noninfective gastroenteritis and colitis, unspecified: Secondary | ICD-10-CM

## 2023-07-08 DIAGNOSIS — E7870 Disorder of bile acid and cholesterol metabolism, unspecified: Secondary | ICD-10-CM | POA: Insufficient documentation

## 2023-07-08 DIAGNOSIS — K9089 Other intestinal malabsorption: Secondary | ICD-10-CM | POA: Diagnosis not present

## 2023-07-08 DIAGNOSIS — E039 Hypothyroidism, unspecified: Secondary | ICD-10-CM

## 2023-07-08 MED ORDER — COLESEVELAM HCL 625 MG PO TABS
625.0000 mg | ORAL_TABLET | Freq: Two times a day (BID) | ORAL | 2 refills | Status: DC
Start: 2023-07-08 — End: 2023-07-09

## 2023-07-08 NOTE — Progress Notes (Signed)
[name] , M.D. Gastroenterology & Hepatology [facility] [address] [place], [place] [address] Primary Care Physician: Lianne Moris, PA-C [address] [place] [address]  Chief Complaint:  Chronic diarrhea , abdominal pain   History of Present Illness:  Gwendolyn Glass is a 42 y.o. female with Hashimoto's thyroiditis , alpha gal syndrome, polyarthralgia, prior C. difficile after antibiotics exposure,  cholecystectomy, appendectomy who is following with GI for  chronic diarrhea and abdominal pain  Patient last seen 06/06/2023.Today Patient reports today that she continues to have 7-8 liquid bowel movements daily, which is slightly better with cholestyramine.  She has issues increasing cholestyramine because of taking other medications.  She has postprandial bloating and diarrhea with excessive burping  History : Patient has a complex history .  Patient reports that she was diagnosed with alpha gal syndrome after tick bite and is not able to tolerate any mammal products, she does not eat dairy products or red meat.  She has been suffering for diarrhea for over 10 years and he has been worse in the past 3 years.  She reports that she will have large amount of liquid stool in the morning and as the day progresses wanting to get smaller, but she would have around 10 bowel movements daily.  This is affecting her life that she is not able to have a social circle and able to leave the house.  Patient denies any blood in stool.  Patient would wake up in the middle of the night to defecate as well.  She was having abdominal discomfort most of the day worsened with defecation.  Patient reports that when she was diagnosed with alpha gal syndrome she stopped eating and lost 30 pounds at that time but recently she has been gaining weight  She has significant surgical history as well with appendectomy cholecystectomy tubal ectopic pregnancy with  removal of fallopian tubes and a hysterectomy because of dysfunctional uterine bleeding  Patient had upper endoscopy and colonoscopy(suboptimal prep) with biopsies negative for celiac H. pylori and microscopic colitis   Last [id number]  Impression: - No gross lesions in esophagus proximally. Biopsied. Salmon- colored mucosa suspicious for short- segment Barrett' s esophagus distally. Biopsied to rule in/ out. - Gastritis in antrum/ prepylorus. Discoloration noted in the body to antrum transition. No other gross lesions in the stomach. Biopsied. - No gross lesions in the duodenal bulb, in the first portion of the duodenum and in the second portion of the duodenum. Biopsied.  Last Colonoscopy:2021  Impression: - Preparation of the colon was fair after copious lavage but not adequate for appropriate screening. - Perianal skin tags found on perianal exam. Hemorrhoids found on digital rectal exam. - Stool in the entire examined colon - Lavaged. - The examined portion of the ileum was normal. Biopsied. - Normal mucosa in the entire examined colon when able to be visualized. Biopsied. - Two 2 to 5 mm polyps in the rectum, removed with a cold snare. Resected and retrieved. - Anal papilla( e) were hypertrophied. Non- bleeding non- thrombosed external and internal hemorrhoids.  - Repeat colonoscopy within 1 year because the bowel preparation was suboptimal ( 2- day preparation necessary) this is critical especially if polyps removed return adenomatous.  Diagnosis 1. Surgical [P], duodenal - BENIGN SMALL BOWEL MUCOSA. - NO VILLOUS BLUNTING OR INCREASE IN INTRAEPITHELIAL LYMPHOCYTES. - NO DYSPLASIA OR MALIGNANCY. 2. Surgical [P], gastric antrum - MILD REACTIVE GASTROPATHY WITH EROSIONS. Gwendolyn Glass IS  NEGATIVE FOR HELICOBACTER PYLORI. - NO INTESTINAL METAPLASIA, DYSPLASIA, OR MALIGNANCY. 3. Surgical [P], distal esophagus - REFLUX CHANGES. - NO INTESTINAL METAPLASIA, DYSPLASIA, OR MALIGNANCY. 4.  Surgical [P], esophagus, random sites - BENIGN SQUAMOUS MUCOSA. - NO INCREASE IN INTRAEPITHELIAL EOSINOPHILS. - NO INTESTINAL METAPLASIA, DYSPLASIA, OR MALIGNANCY. 5. Surgical [P], small bowel, terminal ileum (normal looking) - BENIGN SMALL BOWEL MUCOSA. - NO ACTIVE INFLAMMATION. - NO DYSPLASIA OR MALIGNANCY. 6. Surgical [P], colon, random sites - BENIGN COLONIC MUCOSA. - NO ACTIVE INFLAMMATION OR EVIDENCE OF MICROSCOPIC COLITIS. - NO DYSPLASIA OR MALIGNANCY. 7. Surgical [P], colon, rectum, polyp (2) - HYPERPLASTIC POLYP (X2 FRAGMENTS). - NO DYSPLASIA OR MALIGNANCY    FHx: neg for any gastrointestinal/liver disease, no malignancies Social: Ex-smoker ,no alcohol or illicit drug use  Past Medical History: Past Medical History:  Diagnosis Date   Allergy    Allergy to alpha-gal    Allergy to alpha-gal    Anxiety    Furgeson recluse spider bite 02/27/2016   C. difficile diarrhea 02/29/2016   Depression    GERD (gastroesophageal reflux disease)    Hypertension    Hypokalemia    Miscarriage    pt. states shes  spotting  and wearing a tampon   Ovarian mass, right 03/01/2016   ? complex cyst   Pregnant    Thyroid disease     Past Surgical History: Past Surgical History:  Procedure Laterality Date   ABDOMINAL HYSTERECTOMY N/A 09/16/2018   Procedure: HYSTERECTOMY ABDOMINAL;  Surgeon: Lazaro Arms, MD;  Location: [facility];  Service: Gynecology;  Laterality: N/A;   APPENDECTOMY     CESAREAN SECTION     CHOLECYSTECTOMY     CHOLECYSTECTOMY  07/28/2012   Procedure: LAPAROSCOPIC CHOLECYSTECTOMY;  Surgeon: Fabio Bering, MD;  Location: [facility];  Service: General;  Laterality: N/A;   LAPAROSCOPY N/A 09/07/2014   Procedure: LAPAROSCOPY OPERATIVE;  Surgeon: Adam Phenix, MD;  Location: [facility];  Service: Gynecology;  Laterality: N/A;   removal of lt fallopian tube     UNILATERAL SALPINGECTOMY Right 09/07/2014   Procedure: UNILATERAL SALPINGECTOMY;  Surgeon: Adam Phenix, MD;   Location: [facility];  Service: Gynecology;  Laterality: Right;   WOUND DEBRIDEMENT Right 02/28/2016   Procedure: DEBRIDEMENT OF RIGHT SHOULDER SPIDER BITE;  Surgeon: Franky Macho, MD;  Location: [facility];  Service: General;  Laterality: Right;    Family History: Family History  Problem Relation Age of Onset   Lung cancer Father 15   Colon cancer Neg Hx    Esophageal cancer Neg Hx    Rectal cancer Neg Hx    Stomach cancer Neg Hx    Adrenal disorder Neg Hx    Liver disease Neg Hx    Inflammatory bowel disease Neg Hx    Pancreatic cancer Neg Hx     Social History: Social History   Tobacco Use  Smoking Status Former   Current packs/day: 0.50   Average packs/day: 0.5 packs/day for 1 year (0.5 ttl pk-yrs)   Types: Cigarettes   Passive exposure: Past  Smokeless Tobacco Never   Social History   Substance and Sexual Activity  Alcohol Use Not Currently   Social History   Substance and Sexual Activity  Drug Use No    Allergies: Allergies  Allergen Reactions   Alpha-Gal Anaphylaxis   Cinnamon Swelling and Other (See Comments)    REACTION: throat swells   Bee Venom Swelling and Other (See Comments)    REACTION: All over body swelling  Vicodin [Hydrocodone-Acetaminophen] Nausea And Vomiting   Benadryl [Diphenhydramine]     Couldn't talk or function with injectable- pill gives chest pain and hives and SOB   Dilaudid [Hydromorphone Hcl] Hives   Toradol [Ketorolac Tromethamine]     Hives, and chest pain    Medications: Current Outpatient Medications  Medication Sig Dispense Refill   ALPRAZolam (XANAX) 1 MG tablet Take 1 mg by mouth 2 (two) times daily as needed for anxiety or sleep.     buPROPion (WELLBUTRIN XL) 150 MG 24 hr tablet Take 150 mg by mouth every morning.     cholestyramine (QUESTRAN) 4 g packet Take 1 packet (4 g total) by mouth 2 (two) times daily. 60 each 6   colesevelam (WELCHOL) 625 MG tablet Take 1 tablet (625 mg total) by mouth 2 (two) times daily with a  meal. 60 tablet 2   cromolyn (GASTROCROM) 100 MG/5ML solution Take by mouth as directed. Takes 8 liquid bottles per day     Cyanocobalamin 1000 MCG/ML KIT Inject 1,000 mcg as directed once a week. Patient is injecting every two weeks.     EPINEPHrine (EPIPEN IJ) Inject as directed. As needed     ergocalciferol (VITAMIN D2) 1.25 MG (50000 UT) capsule 50,000 Units. Patient takes 50,000 units by mouth twice weekly     famotidine (PEPCID) 40 MG tablet Take 40 mg by mouth daily.     KETOTIFEN FUMARATE OP Take 1 mg by mouth as needed (for cells).     Levothyroxine Sodium 137 MCG/ML SOLN Take 137 mcg by mouth daily before breakfast. 30 mL 3   losartan (COZAAR) 25 MG tablet Take 0.5 tablets (12.5 mg total) by mouth daily. 45 tablet 3   metoprolol tartrate (LOPRESSOR) 25 MG tablet Take 0.5 tablets (12.5 mg total) by mouth 2 (two) times daily as needed (palpitations). 30 tablet 2   potassium chloride (KLOR-CON) 20 MEQ packet Take 2 packets by mouth daily.     promethazine (PHENERGAN) 25 MG tablet Take 25 mg by mouth every 8 (eight) hours as needed for nausea or vomiting.     omeprazole (PRILOSEC) 40 MG capsule Take 40 mg by mouth in the morning and at bedtime. (Patient not taking: Reported on 07/08/2023)     psyllium (METAMUCIL) 58.6 % packet Take 1 packet by mouth 3 (three) times daily. (Patient not taking: Reported on 07/08/2023) 30 each 12   Current Facility-Administered Medications  Medication Dose Route Frequency Provider Last Rate Last Admin   0.9 %  sodium chloride infusion  500 mL Intravenous Once Mansouraty, Netty Starring., MD        Review of Systems: GENERAL: negative for malaise, night sweats HEENT: No changes in hearing or vision, no nose bleeds or other nasal problems. NECK: Negative for lumps, goiter, pain and significant neck swelling RESPIRATORY: Negative for cough, wheezing CARDIOVASCULAR: Negative for chest pain, leg swelling, palpitations, orthopnea GI: SEE HPI SKIN: Negative for  lesions, rash HEMATOLOGY Negative for prolonged bleeding, bruising easily, and swollen nodes. ENDOCRINE: Negative for cold or heat intolerance, polyuria, polydipsia and goiter. NEURO: negative for tremor, gait imbalance, syncope and seizures. The remainder of the review of systems is noncontributory.   Physical Exam: BP 112/79 (BP Location: Left Arm, Patient Position: Sitting, Cuff Size: Large)   Pulse 99   Temp 97.8 F (36.6 C) (Temporal)   Ht 5\' 8"  (1.727 m)   Wt 263 lb 12.8 oz (119.7 kg)   LMP 04/11/2017 (Exact Date) Comment: has had continual periods  BMI 40.11 kg/m  GENERAL: The patient is AO x3, in no acute distress. HEENT: Head is normocephalic and atraumatic. EOMI are intact. Mouth is well hydrated and without lesions. NECK: Supple. No masses LUNGS: Clear to auscultation. No presence of rhonchi/wheezing/rales. Adequate chest expansion HEART: RRR, normal s1 and s2. ABDOMEN: Soft, nontender, no guarding, no peritoneal signs, and nondistended. BS +. No masses. EXTREMITIES: Without any cyanosis, clubbing, rash, lesions or edema. NEUROLOGIC: AOx3, no focal motor deficit. SKIN: no jaundice, no rashes   Imaging/Labs: as above  I personally reviewed and interpreted the available labs, imaging and endoscopic files. Stool electrolytes were ?cancelled H pylori negative Fecal calprotectin normal  Normal 5-HIAA, Gastrin levels , Glucagon , VIP  Impression and Plan:  Gwendolyn Glass is a 42 y.o. female with Hashimoto's thyroiditis , alpha gal syndrome, polyarthralgia, prior C. difficile after antibiotics exposure,  cholecystectomy, appendectomy who is following with GI for  chronic diarrhea and abdominal pain. After extensive workup so far workup is positive for Bile acid diarrhea   #Chronic diarrhea  Patient has more than 3 loose stools for more than 4 weeks hence qualifies for defination of chronic diarrhea  Only workup so far positive is likely bile acid diarrhea , 7AlphaC4  is elevated   Patient has chronic diarrhea for over 10 years with significant strain on her social life.  Has been previously evaluated by another gastroenterologist with extent of workup including upper endoscopy and colonoscopy with biopsies negative for H. pylori celiac and microscopic colitis  Workup : Stool studies has been negative for Entamoeba Giardia and negative GI PCR Negative CRP also goes against inflammatory diarrhea Given malabsorption her fecal elastase was also negative Stool electrolytes were ?cancelled H pylori negative Fecal calprotectin normal  Normal 5-HIAA, Gastrin levels , Glucagon , VIP  Patient has tried Metamucil previously takes Imodium daily in the morning and cholestyramine twice a day.  If she does not take cholestyramine and her diarrhea will get worse   SIBO also remains on differential given postprandial bloating, diarrhea flatulence and excessive eructation.  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  Reports low vitamin B12 and is on vitamin B supplements.  We do not have any documentation of low vitamin B 12 in her chart.  H. Pylori nfection has been shown to be positive antiparietal antibody and also had vitamin B12 deficiency although biopsies were negative for H. pylori .   She is hypothyroid and her TSH remains elevated reports unable to absorb thyroxine, would recommend following up with endocrinologist.  Although patient has gained weight which is reassuring in her case as it gives a indication she is able to absorb nutrition.  Recommendation:  -Will obtain stool electrolytes and osmolality to calculate stool osmolar gap to distinguish between secretory and osmotic diarrhea  -Recommended Metamucil starting 1 scoop daily for 1 week followed by 2 scoops daily second week, and 3 scoops daily thereafter, to bulk up stool  -For bile acid diarrhea will try Colesevelam 625 mg tab, 3-6 tab daily with  cholestyramine  .Take this medication 4 hours apart from other medicines  -Imodium twice daily  -In future we will consider carbohydrate breath test to evaluate for  SIBO and measure hydrogen and methane gases in exhaled breath samples   -Previous colonoscopy was suboptimal prep and was recommended to repeat in 1 year may consider repeating in future  BMI 40      - walking at a brisk pace/biking at moderate intesity  2.5-5 hours per week     - use pedometer/step counter to track activity     - goal to lose 5-10% of initial body weight     - avoid suagry drinks and juices, use zero calorie beverages     - increase water intake     - eat a low carb diet with plenty of veggies and fruit     - Get sufficient sleep 7-8 hrs nightly     - maitain active lifestyle     - avoid alcohol     - recommend 2-3 cups Coffee daily    All questions were answered. Dicussed all labs, prior endoscopic evaluations and stool testing today again in detail .  [name], MD Gastroenterology and Hepatology [facility]

## 2023-07-08 NOTE — Patient Instructions (Addendum)
It was very nice to meet you today, as dicussed with will plan for the following :  1) You probably have  bile acid diarrhea , your 7AlphaC4 is elevated , will prescribe Colesevelam 625 mg tabelts , 3-6 tab daily  Metamucil twice daily Imodium twice daily   2) Stool studies

## 2023-07-09 ENCOUNTER — Telehealth (INDEPENDENT_AMBULATORY_CARE_PROVIDER_SITE_OTHER): Payer: Self-pay

## 2023-07-09 ENCOUNTER — Other Ambulatory Visit (INDEPENDENT_AMBULATORY_CARE_PROVIDER_SITE_OTHER): Payer: Self-pay | Admitting: Gastroenterology

## 2023-07-09 DIAGNOSIS — K529 Noninfective gastroenteritis and colitis, unspecified: Secondary | ICD-10-CM

## 2023-07-09 DIAGNOSIS — K9089 Other intestinal malabsorption: Secondary | ICD-10-CM

## 2023-07-09 MED ORDER — COLESEVELAM HCL 625 MG PO TABS
625.0000 mg | ORAL_TABLET | Freq: Two times a day (BID) | ORAL | 2 refills | Status: DC
Start: 2023-07-09 — End: 2023-11-04

## 2023-07-09 NOTE — Telephone Encounter (Signed)
Colesevelam denied has to have tried Questran and Clostipol. Per records patient has tried Questran,but not clostipol. Please advise.  YOU ASKED FOR: Service Description Code 1 Code 2 Plan Requested Dates Requested Amount COLESEVELAM HCL Tablet [id number] Medicaid 07/08/2023 60 units    WE DENIED: Service Description Code 1 Code 2 Plan Denied Dates Denied Amount COLESEVELAM HCL Tablet [id number] Medicaid 07/08/2023 60 units COMMENTS: Per the health plan's preferred drug list, at least 2 preferred drugs must be tried before requesting this drug or tell us why the member cannot try any preferred alternatives. Please send [place] supporting chart notes and lab results. Here is list of preferred alternatives: colestipol tablet.   Per our records, the member has already tried [place].  Note: Some preferred drug(s) may have quantity limits. Refer to the health plan's preferred drug list for additional details. Authority Supporting Decision: We base our decision to approve or deny a request for Medicaid services on: ? Policies found on our website at: [email or web address]

## 2023-07-09 NOTE — Telephone Encounter (Signed)
Gwendolyn Glass is a 42 y.o. female with Hashimoto's thyroiditis , alpha gal syndrome, polyarthralgia, prior C. difficile after antibiotics exposure,  cholecystectomy, appendectomy who is following with GI for  chronic diarrhea and abdominal pain. After extensive workup so far workup is positive for Bile acid diarrhea    #Chronic diarrhea   Patient has more than 3 loose stools for more than 4 weeks hence qualifies for defination of chronic diarrhea   Only workup so far positive is likely bile acid diarrhea , 7AlphaC4 is elevated    Patient has chronic diarrhea for over 10 years with significant strain on her social life.  Has been previously evaluated by another gastroenterologist with extent of workup including upper endoscopy and colonoscopy with biopsies negative for H. pylori celiac and microscopic colitis   Workup : Stool studies has been negative for Entamoeba Giardia and negative GI PCR Negative CRP also goes against inflammatory diarrhea Given malabsorption her fecal elastase was also negative Stool electrolytes were ?cancelled H pylori negative Fecal calprotectin normal  Normal 5-HIAA, Gastrin levels , Glucagon , VIP   Patient has tried Metamucil previously takes Imodium daily in the morning and cholestyramine twice a day and also colestipol and continues to suffer from Bile acid diarrhea .  If she does not take cholestyramine and her diarrhea will get worse    SIBO also remains on differential given postprandial bloating, diarrhea flatulence and excessive eructation.   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  Reports low vitamin B12 and is on vitamin B supplements.  We do not have any documentation of low vitamin B 12 in her chart.   H. Pylori nfection has been shown to be positive antiparietal antibody and also had vitamin B12 deficiency although biopsies were negative for H. pylori .    She is hypothyroid and her TSH remains elevated reports  unable to absorb thyroxine, would recommend following up with endocrinologist.   Although patient has gained weight which is reassuring in her case as it gives a indication she is able to absorb nutrition.   Recommendation:   -Will obtain stool electrolytes and osmolality to calculate stool osmolar gap to distinguish between secretory and osmotic diarrhea   -Recommended Metamucil starting 1 scoop daily for 1 week followed by 2 scoops daily second week, and 3 scoops daily thereafter, to bulk up stool   -For bile acid diarrhea will try Colesevelam 625 mg tab, 3-6 tab daily with cholestyramine .Take this medication 4 hours apart from other medicines. Previously failed colestipol also    -Imodium twice daily   -In future we will consider carbohydrate breath test to evaluate for  SIBO and measure hydrogen and methane gases in exhaled breath samples    -Previous colonoscopy was suboptimal prep and was recommended to repeat in 1 year may consider repeating in future

## 2023-07-09 NOTE — Telephone Encounter (Signed)
So we will need to fill out the forms on your desk.

## 2023-07-10 ENCOUNTER — Telehealth (INDEPENDENT_AMBULATORY_CARE_PROVIDER_SITE_OTHER): Payer: Self-pay

## 2023-07-10 NOTE — Telephone Encounter (Signed)
Thank you Crystal for getting this done . Hopefully this will help the patient

## 2023-07-10 NOTE — Telephone Encounter (Signed)
Contact us: Date: 07/10/2023 Ticket #: [id number] Reviewer: [employer] [address] [address] [place], [place] [address] Phone: [phone] Appeals Fax: [phone] LOB: [facility] MEDICAID Dear Provider: Re: Member: Gwendolyn Glass ID # [id number] DOB: 1981/02/06 Recommendations are intended to assist providers in monitoring drug regimens and ensure quality of care. We appreciate your input and comments regarding your individual experiences and cases. Medication in Question: COLESEVELAM HCL Tablet 625MG  All Pharmacy reviews are completed in accordance with the following: FDA Guidelines and National Guidelines. Reason for Approval: This drug has been approved. Approved quantity: 60 tablets per 30 day(s). You may fill up to a 34 day supply at a retail pharmacy. You may fill up to a 90 day supply for maintenance drugs, please refer to the formulary for details. Please call the pharmacy to process your prescription claim. Approval Timeframe: Start Date 07/10/2023 End Date 07/09/2024

## 2023-07-18 LAB — TSH: TSH: 15.4 u[IU]/mL — ABNORMAL HIGH (ref 0.450–4.500)

## 2023-07-23 ENCOUNTER — Encounter: Payer: Self-pay | Admitting: Nurse Practitioner

## 2023-07-23 ENCOUNTER — Ambulatory Visit (INDEPENDENT_AMBULATORY_CARE_PROVIDER_SITE_OTHER): Payer: Medicaid Other | Admitting: Nurse Practitioner

## 2023-07-23 VITALS — BP 110/72 | HR 90 | Ht 68.0 in | Wt 260.4 lb

## 2023-07-23 DIAGNOSIS — E038 Other specified hypothyroidism: Secondary | ICD-10-CM | POA: Diagnosis not present

## 2023-07-23 DIAGNOSIS — E063 Autoimmune thyroiditis: Secondary | ICD-10-CM | POA: Diagnosis not present

## 2023-07-23 MED ORDER — LEVOTHYROXINE SODIUM 137 MCG/ML PO SOLN: 137.0000 ug | Freq: Every day | ORAL | 3 refills | Status: DC

## 2023-07-23 NOTE — Patient Instructions (Signed)

## 2023-07-23 NOTE — Progress Notes (Signed)
Endocrinology Follow Up Note                                         07/23/2023, 4:25 PM  Subjective:   Subjective    Gwendolyn Glass is a 42 y.o.-year-old female patient being seen in follow up after being seen in consultation for hypothyroidism referred by Gwendolyn Moris, PA-C.   Past Medical History:  Diagnosis Date   Allergy    Allergy to alpha-gal    Allergy to alpha-gal    Anxiety    [place] recluse spider bite 02/27/2016   C. difficile diarrhea 02/29/2016   Depression    GERD (gastroesophageal reflux disease)    Hypertension    Hypokalemia    Miscarriage    pt. states shes  spotting  and wearing a tampon   Ovarian mass, right 03/01/2016   ? complex cyst   Pregnant    Thyroid disease     Past Surgical History:  Procedure Laterality Date   ABDOMINAL HYSTERECTOMY N/A 09/16/2018   Procedure: HYSTERECTOMY ABDOMINAL;  Surgeon: Lazaro Arms, MD;  Location: [facility];  Service: Gynecology;  Laterality: N/A;   APPENDECTOMY     CESAREAN SECTION     CHOLECYSTECTOMY     CHOLECYSTECTOMY  07/28/2012   Procedure: LAPAROSCOPIC CHOLECYSTECTOMY;  Surgeon: Fabio Bering, MD;  Location: [facility];  Service: General;  Laterality: N/A;   LAPAROSCOPY N/A 09/07/2014   Procedure: LAPAROSCOPY OPERATIVE;  Surgeon: Adam Phenix, MD;  Location: [facility];  Service: Gynecology;  Laterality: N/A;   removal of lt fallopian tube     UNILATERAL SALPINGECTOMY Right 09/07/2014   Procedure: UNILATERAL SALPINGECTOMY;  Surgeon: Adam Phenix, MD;  Location: [facility];  Service: Gynecology;  Laterality: Right;   WOUND DEBRIDEMENT Right 02/28/2016   Procedure: DEBRIDEMENT OF RIGHT SHOULDER SPIDER BITE;  Surgeon: Franky Macho, MD;  Location: [facility];  Service: General;  Laterality: Right;    Social History   Socioeconomic History   Marital status: Married    Spouse name: Not on file   Number of children: Not on file   Years of education: Not  on file   Highest education level: Not on file  Occupational History   Not on file  Tobacco Use   Smoking status: Former    Current packs/day: 0.50    Average packs/day: 0.5 packs/day for 1 year (0.5 ttl pk-yrs)    Types: Cigarettes    Passive exposure: Past   Smokeless tobacco: Never  Vaping Use   Vaping status: Never Used  Substance and Sexual Activity   Alcohol use: Not Currently   Drug use: No   Sexual activity: Not Currently    Birth control/protection: Surgical    Comment: hyst  Other Topics Concern   Not on file  Social History Narrative   Not on file   Social Determinants of Health   Financial Resource Strain: Not on file  Food Insecurity: Not on file  Transportation Needs: Not on file  Physical Activity: Not on file  Stress: Not on file  Social Connections: Not on file    Family History  Problem Relation Age of Onset   Lung cancer Father 96   Colon cancer Neg Hx    Esophageal cancer Neg Hx    Rectal cancer Neg Hx    Stomach cancer Neg Hx    Adrenal disorder Neg Hx    Liver disease Neg Hx    Inflammatory bowel disease Neg Hx    Pancreatic cancer Neg Hx     Outpatient Encounter Medications as of 07/23/2023  Medication Sig   ALPRAZolam (XANAX) 1 MG tablet Take 1 mg by mouth 2 (two) times daily as needed for anxiety or sleep.   cholestyramine (QUESTRAN) 4 g packet Take 1 packet (4 g total) by mouth 2 (two) times daily.   colesevelam (WELCHOL) 625 MG tablet Take 1 tablet (625 mg total) by mouth 2 (two) times daily with a meal.   cromolyn (GASTROCROM) 100 MG/5ML solution Take by mouth as directed. Takes 8 liquid bottles per day   Cyanocobalamin 1000 MCG/ML KIT Inject 1,000 mcg as directed once a week. Patient is injecting every two weeks.   EPINEPHrine (EPIPEN IJ) Inject as directed. As needed   ergocalciferol (VITAMIN D2) 1.25 MG (50000 UT) capsule 50,000 Units. Patient takes 50,000 units by mouth twice weekly   famotidine (PEPCID) 40 MG tablet Take 40 mg by  mouth daily.   KETOTIFEN FUMARATE OP Take 1 mg by mouth as needed (for cells).   losartan (COZAAR) 25 MG tablet Take 0.5 tablets (12.5 mg total) by mouth daily.   metoprolol tartrate (LOPRESSOR) 25 MG tablet Take 0.5 tablets (12.5 mg total) by mouth 2 (two) times daily as needed (palpitations).   potassium chloride (KLOR-CON) 20 MEQ packet Take 2 packets by mouth daily.   promethazine (PHENERGAN) 25 MG tablet Take 25 mg by mouth every 8 (eight) hours as needed for nausea or vomiting.   [DISCONTINUED] Levothyroxine Sodium 137 MCG/ML SOLN Take 137 mcg by mouth daily before breakfast.   buPROPion (WELLBUTRIN XL) 150 MG 24 hr tablet Take 150 mg by mouth every morning. (Patient not taking: Reported on 07/23/2023)   Levothyroxine Sodium 137 MCG/ML SOLN Take 137 mcg by mouth daily before breakfast.   omeprazole (PRILOSEC) 40 MG capsule Take 40 mg by mouth in the morning and at bedtime. (Patient not taking: Reported on 07/08/2023)   psyllium (METAMUCIL) 58.6 % packet Take 1 packet by mouth 3 (three) times daily. (Patient not taking: Reported on 07/08/2023)   Facility-Administered Encounter Medications as of 07/23/2023  Medication   0.9 %  sodium chloride infusion    ALLERGIES: Allergies  Allergen Reactions   Alpha-Gal Anaphylaxis   Cinnamon Swelling and Other (See Comments)    REACTION: throat swells   Bee Venom Swelling and Other (See Comments)    REACTION: All over body swelling   Vicodin [Hydrocodone-Acetaminophen] Nausea And Vomiting   Benadryl [Diphenhydramine]     Couldn't talk or function with injectable- pill gives chest pain and hives and SOB   Dilaudid [Hydromorphone Hcl] Hives   Toradol [Ketorolac Tromethamine]     Hives, and chest pain   VACCINATION STATUS: Immunization History  Administered Date(s) Administered   Influenza,inj,Quad PF,6+ Mos 02/28/2016   Moderna Sars-Covid-2 Vaccination 02/08/2020, 03/07/2020     HPI   Gwendolyn Glass is a patient with the above medical  history. she was diagnosed with hypothyroidism which required subsequent initiation of thyroid hormone replacement  therapy. she was given various doses of Levothyroxine, branded-Synthroid, and Tirosint over the years, currently on Levothyroxine solution 137 micrograms. she reports compliance to this medication:  Taking it daily on empty stomach with water, separated by >30 minutes before breakfast and other medications, and by at least 4 hours from calcium, iron, PPIs, multivitamins.  She notes she is taking cholestyramine daily to help with absorption problems.  She describes her disease course as starting out as diarrhea which came after every meal or liquid.  Then she was diagnosed with alpha-gal about 2 years ago and then, slowly she started to develop other symptoms, dizziness, abdominal cramping, headaches, near syncopal episodes.  She has seen many different specialists, was being worked up for POTS but wanted to have her thyroid managed first at it may be affecting her other symptoms.  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  She has not yet started taking the medication at the times suggested by her GI specialist, wanted to see what we said first..   I reviewed patient's thyroid tests:  Lab Results  Component Value Date   TSH 15.400 (H) 07/17/2023   TSH 9.510 (H) 04/15/2023   TSH 10.300 (H) 02/11/2023   TSH 24.300 (H) 12/12/2022   TSH 6.420 (H) 10/08/2022   TSH 17.10 (A) 08/17/2022   TSH 11.70 (A) 05/23/2022   TSH 15.20 (A) 04/10/2022   TSH 18.49 (H) 06/22/2021   TSH 3.56 03/07/2021   FREET4 0.94 07/17/2023   FREET4 1.04 04/15/2023   FREET4 0.90 02/11/2023   FREET4 0.87 12/12/2022   FREET4 1.11 10/08/2022   FREET4 0.90 03/07/2021    Pt denies feeling nodules in neck, hoarseness, dysphagia/odynophagia, SOB with lying down.  she does not know of family history of  thyroid disorders including cancer.  No history of radiation therapy to head or neck.  No recent use of iodine supplements.  Denies use of Biotin containing supplements.  She has had thyroid ultrasound in August 2022 which was normal with exception of noting cell heterogeneity.  I reviewed her chart and she also has a history of GERD, tobacco abuse, malabsorption issues.   ROS:  Constitutional: + steady body weight, + fatigue, no subjective hyperthermia, no subjective hypothermia, chronic headache Eyes: no blurry vision, no xerophthalmia ENT: no sore throat, no nodules palpated in throat, no dysphagia/odynophagia, no hoarseness Cardiovascular: no chest pain, no SOB, + intermittent palpitations, no leg swelling, dizziness with position changes (being worked up for POTS) Respiratory: no cough, no SOB Gastrointestinal: no nausea/vomiting, + chronic diarrhea triggered by any oral intake Musculoskeletal: no muscle/joint aches, intermittent diffuse muscle spasms-worsening- ongoing eval with neurology Skin: no rashes Neurological: no tremors, no numbness, no tingling, + intermittent dizziness Psychiatric: no depression, no anxiety   Objective:   Objective     BP 110/72 (BP Location: Left Arm, Patient Position: Sitting, Cuff Size: Large)   Pulse 90   Ht 5\' 8"  (1.727 m)   Wt 260 lb 6.4 oz (118.1 kg)   LMP 04/11/2017 (Exact Date) Comment: has had continual periods  BMI 39.59 kg/m  Wt Readings from Last 3 Encounters:  07/23/23 260 lb 6.4 oz (118.1 kg)  07/08/23 263 lb 12.8 oz (119.7 kg)  06/06/23 265 lb 14.4 oz (120.6 kg)    BP Readings from Last 3 Encounters:  07/23/23 110/72  07/08/23 112/79  06/06/23 117/84  Physical Exam- Limited  Constitutional:  Body mass index is 39.59 kg/m. , not in acute distress, normal state of mind Eyes:  EOMI, no exophthalmos Musculoskeletal: no gross deformities, strength intact in all four extremities, no gross restriction of joint  movements Skin:  no rashes, no hyperemia Neurological: no tremor with outstretched hands    CMP ( most recent) CMP     Component Value Date/Time   NA 142 12/25/2022 1150   NA 142 07/26/2022 1115   K 4.0 12/25/2022 1150   CL 107 12/25/2022 1150   CO2 26 12/25/2022 1150   GLUCOSE 83 12/25/2022 1150   BUN 8 12/25/2022 1150   BUN 8 07/26/2022 1115   CREATININE 0.83 12/25/2022 1150   CALCIUM 9.2 12/25/2022 1150   PROT 6.8 12/25/2022 1150   PROT 6.6 12/25/2022 1150   PROT 6.0 09/28/2020 1443   ALBUMIN 4.2 06/22/2021 1125   AST 26 12/25/2022 1150   ALT 40 (H) 12/25/2022 1150   ALKPHOS 83 06/22/2021 1125   BILITOT 0.4 12/25/2022 1150   GFRNONAA >60 11/10/2020 1622   GFRAA >60 09/17/2018 0525     Diabetic Labs (most recent): No results found for: "HGBA1C", "MICROALBUR"   Lipid Panel ( most recent) Lipid Panel  No results found for: "CHOL", "TRIG", "HDL", "CHOLHDL", "VLDL", "LDLCALC", "LDLDIRECT", "LABVLDL"     Lab Results  Component Value Date   TSH 15.400 (H) 07/17/2023   TSH 9.510 (H) 04/15/2023   TSH 10.300 (H) 02/11/2023   TSH 24.300 (H) 12/12/2022   TSH 6.420 (H) 10/08/2022   TSH 17.10 (A) 08/17/2022   TSH 11.70 (A) 05/23/2022   TSH 15.20 (A) 04/10/2022   TSH 18.49 (H) 06/22/2021   TSH 3.56 03/07/2021   FREET4 0.94 07/17/2023   FREET4 1.04 04/15/2023   FREET4 0.90 02/11/2023   FREET4 0.87 12/12/2022   FREET4 1.11 10/08/2022   FREET4 0.90 03/07/2021      Thyroid US from 07/30/22 US Thyroid  Anatomical Region Laterality Modality  Neck -- Ultrasound   Impression  Thyroid heterogeneity without discrete nodule.  The above is in keeping with the ACR TI-RADS recommendations - J Am Coll Radiol 2017;14:587-595.   Electronically Signed   By: Judie Petit.  Shick M.D.   On: 07/30/2021 13:33 Narrative  CLINICAL DATA:  Palpable left thyroid nodule, neck fullness  EXAM: THYROID ULTRASOUND  TECHNIQUE: Ultrasound examination of the thyroid gland and adjacent  soft tissues was performed.  COMPARISON:  None.  FINDINGS: Parenchymal Echotexture: Moderately heterogenous  Isthmus: 3 mm  Right lobe: 4.3 x 1.8 x 1.3 cm  Left lobe: 3.6 x 1.8 x 1.3 cm  _________________________________________________________  Estimated total number of nodules >/= 1 cm: 0  Number of spongiform nodules >/=  2 cm not described below (TR1): 0  Number of mixed cystic and solid nodules >/= 1.5 cm not described below (TR2): 0  _________________________________________________________  Nonspecific thyroid heterogeneity suggesting medical thyroid disease. No hypervascularity or nodule. No adenopathy. Procedure Note  Sherren Mocha, MD - 07/30/2021 Formatting of this note might be different from the original. CLINICAL DATA:  Palpable left thyroid nodule, neck fullness  EXAM: THYROID ULTRASOUND  TECHNIQUE: Ultrasound examination of the thyroid gland and adjacent soft tissues was performed.  COMPARISON:  None.  FINDINGS: Parenchymal Echotexture: Moderately heterogenous  Isthmus: 3 mm  Right lobe: 4.3 x 1.8 x 1.3 cm  Left lobe: 3.6 x 1.8 x 1.3 cm  _________________________________________________________  Estimated total number of nodules >/= 1 cm: 0  Number  of spongiform nodules >/=  2 cm not described below (TR1): 0  Number of mixed cystic and solid nodules >/= 1.5 cm not described below (TR2): 0  _________________________________________________________  Nonspecific thyroid heterogeneity suggesting medical thyroid disease. No hypervascularity or nodule. No adenopathy.  IMPRESSION: Thyroid heterogeneity without discrete nodule.  The above is in keeping with the ACR TI-RADS recommendations - J Am Coll Radiol 2017;14:587-595.   Electronically Signed   By: Judie Petit.  Shick M.D.   On: 07/30/2021 13:33 Exam End: 07/30/21 13:32   Specimen Collected: 07/30/21 13:32 Last Resulted: 07/30/21 13:33  Received From: [facility]       Latest Reference Range & Units 08/17/22 00:00 10/08/22 13:57 12/12/22 16:18 02/11/23 15:53 04/15/23 16:40 07/17/23 16:40  TSH 0.450 - 4.500 uIU/mL 17.10 ! (E) 6.420 (H) 24.300 (H) 10.300 (H) 9.510 (H) 15.400 (H)  Triiodothyronine,Free,Serum 2.0 - 4.4 pg/mL  2.5      T4,Free(Direct) 0.82 - 1.77 ng/dL  3.22 0.25 4.27 0.62 3.76  Thyroperoxidase Ab SerPl-aCnc 0 - 34 IU/mL  160 (H)      Thyroglobulin Antibody 0.0 - 0.9 IU/mL  2.2 (H)      !: Data is abnormal (H): Data is abnormally high (E): External lab result   Assessment & Plan:   ASSESSMENT / PLAN:  1. Hypothyroidism-r/t Hashimotos thyroiditis  Patient with long-standing hypothyroidism, on thyroid hormone replacement therapy. On physical exam, patient does not have gross goiter, thyroid nodules, or neck compression symptoms.  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  Initially they did improve some indicating she may be tolerating it better.  I had increased her thyroid hormone last visit but she took it once and had severe palpitations to where EMS was called, thus she did decrease back to 137 mcg solution daily.  I advised her to stay on this dose for now and go ahead and start her GI regimen to see if it impacts her thyroid hormone absorption as well.  We also discussed trying to take her Levothyroxine solution before bed due to timing constraints with the cholestyramine.  - We discussed about correct intake of levothyroxine, at fasting, with water, separated by at least 30 minutes from breakfast, and separated by more than 4 hours from calcium, iron, multivitamins, acid reflux medications (PPIs). -Patient is made aware of the fact that thyroid hormone replacement is needed for life, dose to be adjusted by periodic monitoring of thyroid function tests.    -Due to absence of clinical  goiter and essentially normal [place] from a year ago, no need for additional thyroid ultrasound at this time.       I spent  33  minutes in the care of the patient today including review of labs from Thyroid Function, CMP, and other relevant labs ; imaging/biopsy records (current and previous including abstractions from other facilities); face-to-face time discussing  her lab results and symptoms, medications doses, her options of short and long term treatment based on the latest standards of care / guidelines;   and documenting the encounter.  Suzette Battiest  participated in the discussions, expressed understanding, and voiced agreement with the above plans.  All questions were answered to her satisfaction. she is encouraged to contact clinic should she have any questions or concerns prior to her return visit.   FOLLOW UP PLAN:  Return in about 3 months (around 10/23/2023) for Thyroid follow up, Previsit  labs.  Ronny Bacon, [facility] [address] [place], [place] [address] Phone: [phone] Fax: [phone]  07/23/2023, 4:25 PM

## 2023-07-25 ENCOUNTER — Other Ambulatory Visit: Payer: Self-pay | Admitting: Medical Genetics

## 2023-07-25 DIAGNOSIS — Z006 Encounter for examination for normal comparison and control in clinical research program: Secondary | ICD-10-CM

## 2023-08-13 ENCOUNTER — Telehealth: Payer: Self-pay | Admitting: *Deleted

## 2023-08-13 NOTE — Telephone Encounter (Signed)
Patient states that she has been without her thyroid medication for 2 weeks, the pharmacy's fault. They ask her to please check with provider, she will be starting with the 137 mcg and they felt this would be to high and there could be serious side effects. Patient is asking how should she restart this?

## 2023-08-13 NOTE — Telephone Encounter (Signed)
It should be safe.  This is not a medication that she has to work up to.

## 2023-08-13 NOTE — Telephone Encounter (Signed)
Patient was called and made aware. 

## 2023-10-15 LAB — T4, FREE: Free T4: 1.17 ng/dL (ref 0.82–1.77)

## 2023-10-15 LAB — TSH: TSH: 8.37 u[IU]/mL — ABNORMAL HIGH (ref 0.450–4.500)

## 2023-10-20 ENCOUNTER — Ambulatory Visit: Payer: BC Managed Care – PPO | Attending: Cardiology | Admitting: Cardiology

## 2023-10-20 ENCOUNTER — Encounter: Payer: Self-pay | Admitting: Cardiology

## 2023-10-20 VITALS — BP 132/92 | HR 85 | Ht 68.0 in | Wt 264.4 lb

## 2023-10-20 DIAGNOSIS — R42 Dizziness and giddiness: Secondary | ICD-10-CM

## 2023-10-20 DIAGNOSIS — R002 Palpitations: Secondary | ICD-10-CM

## 2023-10-20 DIAGNOSIS — I10 Essential (primary) hypertension: Secondary | ICD-10-CM

## 2023-10-20 MED ORDER — METOPROLOL TARTRATE 25 MG PO TABS: 12.5000 mg | ORAL_TABLET | Freq: Two times a day (BID) | ORAL | 2 refills | Status: AC | PRN

## 2023-10-20 NOTE — Patient Instructions (Addendum)
Medication Instructions:   Stop Losartan (Cozaar) Lopressor refilled today  Continue all other medications.     Labwork:  none  Testing/Procedures:  none  Follow-Up:  6 months   Any Other Special Instructions Will Be Listed Below (If Applicable).  Contact the office in 1 week to update on BP readings and dizziness.    If you need a refill on your cardiac medications before your next appointment, please call your pharmacy.

## 2023-10-20 NOTE — Progress Notes (Unsigned)
Clinical Summary Ms. Bota is a 42 y.o.female seen today for follow up of the following medical problems.    1.Chest pain/Palpitations    10/2021 heart monitor: rare PACs and PVCs. Min HR 62, Max HR 167, Avg HR 93 - started on lopressor, taking prn. Nervous to take regularly as she at times gets HRs in 40s and 50s on her watch.    10/2021 echo: LVEF 65-70%, no WMAs, normal RV function  - 09/2022 nuclear stress: no ischemia 09/2022 monitor: 30 day monitor, data from 64% of planned monitored time. No significant arrhythmias. Had some issues with monitor, would not stay on.      - symptoms come and go, however overall increasing severity and frequency - watch reports high HRs elevated at times, 187 was the highest. Tends to be triggered with standing - dizziness with standing is ongoing - wears compression stocking, working to stay well hydrated.    - daily symptoms with standing. Typically feels very lightheaded and dizzy. One severe severe episode limited warning.  - working to stay well hydrated, increasing sodium intake.  - ongoing issues with low thyroid.  - has prn lopressor just as needed.      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 difficile diarrhea 02/29/2016   Depression    GERD (gastroesophageal reflux disease)    Hypertension    Hypokalemia    Miscarriage    pt. states shes  spotting  and wearing a tampon   Ovarian mass, right 03/01/2016   ? complex cyst   Pregnant    Thyroid disease      Allergies  Allergen Reactions   Alpha-Gal Anaphylaxis   Cinnamon Swelling and Other (See Comments)    REACTION: throat swells   Bee  Venom Swelling and Other (See Comments)    REACTION: All over body swelling   Vicodin [Hydrocodone-Acetaminophen] Nausea And Vomiting   Benadryl [Diphenhydramine]     Couldn't talk or function with injectable- pill gives chest pain and hives and SOB   Dilaudid [Hydromorphone Hcl] Hives   Toradol [Ketorolac Tromethamine]     Hives, and chest pain     Current Outpatient Medications  Medication Sig Dispense Refill   ALPRAZolam (XANAX) 1 MG tablet Take 1 mg by mouth 2 (two) times daily as needed for anxiety or sleep.     buPROPion (WELLBUTRIN XL) 150 MG 24 hr tablet Take 150 mg by mouth every morning. (Patient not taking: Reported on 07/23/2023)     cholestyramine (QUESTRAN) 4 g packet Take 1 packet (4 g total) by mouth 2 (two) times daily. 60 each 6   colesevelam (WELCHOL) 625 MG tablet Take 1 tablet (625 mg total) by mouth 2 (two) times daily with a meal. 60 tablet 2   cromolyn (GASTROCROM) 100 MG/5ML solution Take by mouth as directed. Takes 8 liquid bottles per day     Cyanocobalamin 1000 MCG/ML KIT Inject 1,000 mcg as directed once a week. Patient is injecting every two weeks.     EPINEPHrine (EPIPEN IJ) Inject as directed. As needed  ergocalciferol (VITAMIN D2) 1.25 MG (50000 UT) capsule 50,000 Units. Patient takes 50,000 units by mouth twice weekly     famotidine (PEPCID) 40 MG tablet Take 40 mg by mouth daily.     KETOTIFEN FUMARATE OP Take 1 mg by mouth as needed (for cells).     Levothyroxine Sodium 137 MCG/ML SOLN Take 137 mcg by mouth daily before breakfast. 30 mL 3   losartan (COZAAR) 25 MG tablet Take 0.5 tablets (12.5 mg total) by mouth daily. 45 tablet 3   metoprolol tartrate (LOPRESSOR) 25 MG tablet Take 0.5 tablets (12.5 mg total) by mouth 2 (two) times daily as needed (palpitations). 30 tablet 2   omeprazole (PRILOSEC) 40 MG capsule Take 40 mg by mouth in the morning and at bedtime. (Patient not taking: Reported on 07/08/2023)     potassium chloride (KLOR-CON) 20 MEQ packet  Take 2 packets by mouth daily.     promethazine (PHENERGAN) 25 MG tablet Take 25 mg by mouth every 8 (eight) hours as needed for nausea or vomiting.     psyllium (METAMUCIL) 58.6 % packet Take 1 packet by mouth 3 (three) times daily. (Patient not taking: Reported on 07/08/2023) 30 each 12   Current Facility-Administered Medications  Medication Dose Route Frequency Provider Last Rate Last Admin   0.9 %  sodium chloride infusion  500 mL Intravenous Once Mansouraty, Netty Starring., MD         Past Surgical History:  Procedure Laterality Date   ABDOMINAL HYSTERECTOMY N/A 09/16/2018   Procedure: HYSTERECTOMY ABDOMINAL;  Surgeon: Lazaro Arms, MD;  Location: [facility];  Service: Gynecology;  Laterality: N/A;   APPENDECTOMY     CESAREAN SECTION     CHOLECYSTECTOMY     CHOLECYSTECTOMY  07/28/2012   Procedure: LAPAROSCOPIC CHOLECYSTECTOMY;  Surgeon: Fabio Bering, MD;  Location: [facility];  Service: General;  Laterality: N/A;   LAPAROSCOPY N/A 09/07/2014   Procedure: LAPAROSCOPY OPERATIVE;  Surgeon: Adam Phenix, MD;  Location: [facility];  Service: Gynecology;  Laterality: N/A;   removal of lt fallopian tube     UNILATERAL SALPINGECTOMY Right 09/07/2014   Procedure: UNILATERAL SALPINGECTOMY;  Surgeon: Adam Phenix, MD;  Location: [facility];  Service: Gynecology;  Laterality: Right;   WOUND DEBRIDEMENT Right 02/28/2016   Procedure: DEBRIDEMENT OF RIGHT SHOULDER SPIDER BITE;  Surgeon: Franky Macho, MD;  Location: [facility];  Service: General;  Laterality: Right;     Allergies  Allergen Reactions   Alpha-Gal Anaphylaxis   Cinnamon Swelling and Other (See Comments)    REACTION: throat swells   Bee Venom Swelling and Other (See Comments)    REACTION: All over body swelling   Vicodin [Hydrocodone-Acetaminophen] Nausea And Vomiting   Benadryl [Diphenhydramine]     Couldn't talk or function with injectable- pill gives chest pain and hives and SOB   Dilaudid [Hydromorphone Hcl] Hives   Toradol [Ketorolac  Tromethamine]     Hives, and chest pain      Family History  Problem Relation Age of Onset   Lung cancer Father 36   Colon cancer Neg Hx    Esophageal cancer Neg Hx    Rectal cancer Neg Hx    Stomach cancer Neg Hx    Adrenal disorder Neg Hx    Liver disease Neg Hx    Inflammatory bowel disease Neg Hx    Pancreatic cancer Neg Hx      Social History Ms. Grunder reports that she has quit smoking. Her smoking use included  cigarettes. She has a 0.5 pack-year smoking history. She has been exposed to tobacco smoke. She has never used smokeless tobacco. Ms. Daris reports that she does not currently use alcohol.   Review of Systems CONSTITUTIONAL: No weight loss, fever, chills, weakness or fatigue.  HEENT: Eyes: No visual loss, blurred vision, double vision or yellow sclerae.No hearing loss, sneezing, congestion, runny nose or sore throat.  SKIN: No rash or itching.  CARDIOVASCULAR: per hpi RESPIRATORY: No shortness of breath, cough or sputum.  GASTROINTESTINAL: No anorexia, nausea, vomiting or diarrhea. No abdominal pain or blood.  GENITOURINARY: No burning on urination, no polyuria NEUROLOGICAL: per hpi MUSCULOSKELETAL: No muscle, back pain, joint pain or stiffness.  LYMPHATICS: No enlarged nodes. No history of splenectomy.  PSYCHIATRIC: No history of depression or anxiety.  ENDOCRINOLOGIC: No reports of sweating, cold or heat intolerance. No polyuria or polydipsia.  Marland Kitchen   Physical Examination Today's Vitals   10/20/23 1559  BP: (!) 132/92  Pulse: 85  SpO2: 98%  Weight: 264 lb 6.4 oz (119.9 kg)  Height: 5\' 8"  (1.727 m)   Body mass index is 40.2 kg/m.  Gen: resting comfortably, no acute distress HEENT: no scleral icterus, pupils equal round and reactive, no palptable cervical adenopathy,  CV: RRR, no mrg, no jvd Resp: Clear to auscultation bilaterally GI: abdomen is soft, non-tender, non-distended, normal bowel sounds, no hepatosplenomegaly MSK: extremities are warm, no  edema.  Skin: warm, no rash Neuro:  no focal deficits Psych: appropriate affect   Diagnostic Studies 10/2021 heart monitor 14 day monitor Rare supraventricular ectopy in the form of isolated PACs and couplets Rare ventricular ectopy in the form of PVCs Triggered events but no reported associated symptoms No significant arrythmias       09/2022 nuclear stress The study is normal. The study is low risk.   No ST deviation was noted.   LV perfusion is normal.   Left ventricular function is normal. Nuclear stress EF: 69 %. The left ventricular ejection fraction is hyperdynamic (>65%). End diastolic cavity size is normal. End systolic cavity size is normal.   Prior study not available for comparison.   Normal perfusion. LVEF 69% with normal wall motion. This is a low risk study. No prior for comparison.     09/2022 monitor 30 day monitor. Data available from 64% of planned monitored time   Min HR 64, Max HR 160, Avg HR 90   No symptoms reported   Telemetry tracings show sinus rhythm and sinus tachycardia      Assessment and Plan  1. Palpitations/Dizziness  -long history of symptoms, primarily occur with standing. Prior orthostatics have been borderline but not confirmatiory, at prior visit HR increasd 25 bpm with standing not quite diagnostic for POTs. I think symptoms are very consistent with her being on the spectrum of autonomic dysfunction.    - ongoing symptoms. Will d/c losartan, accept higher home bp's. Continue aggressive hydration, compression stockings - consider abdominal binder, possible midodrine/florinef pending symptoms going forward. She will update [place] on symptoms and home bp's next week.      EKG today shows NSR     Antoine Poche, M.D.

## 2023-10-21 NOTE — Patient Instructions (Signed)

## 2023-10-23 ENCOUNTER — Encounter: Payer: Self-pay | Admitting: Nurse Practitioner

## 2023-10-23 ENCOUNTER — Ambulatory Visit (INDEPENDENT_AMBULATORY_CARE_PROVIDER_SITE_OTHER): Payer: Medicaid Other | Admitting: Nurse Practitioner

## 2023-10-23 VITALS — BP 134/72 | HR 94 | Ht 68.0 in | Wt 264.2 lb

## 2023-10-23 DIAGNOSIS — E063 Autoimmune thyroiditis: Secondary | ICD-10-CM | POA: Diagnosis not present

## 2023-10-23 MED ORDER — LEVOTHYROXINE SODIUM 75 MCG PO TABS: 75.0000 ug | ORAL_TABLET | Freq: Every day | ORAL | 0 refills | Status: DC

## 2023-10-23 NOTE — Progress Notes (Signed)
Endocrinology Follow Up Note                                         10/23/2023, 3:52 PM  Subjective:   Subjective    Gwendolyn Glass is a 42 y.o.-year-old female patient being seen in follow up after being seen in consultation for hypothyroidism referred by Gwendolyn Moris, PA-C.   Past Medical History:  Diagnosis Date   Allergy    Allergy to alpha-gal    Allergy to alpha-gal    Anxiety    [place] recluse spider bite 02/27/2016   C. difficile diarrhea 02/29/2016   Depression    GERD (gastroesophageal reflux disease)    Hypertension    Hypokalemia    Miscarriage    pt. states shes  spotting  and wearing a tampon   Ovarian mass, right 03/01/2016   ? complex cyst   Pregnant    Thyroid disease     Past Surgical History:  Procedure Laterality Date   ABDOMINAL HYSTERECTOMY N/A 09/16/2018   Procedure: HYSTERECTOMY ABDOMINAL;  Surgeon: Gwendolyn Arms, MD;  Location: [facility];  Service: Gynecology;  Laterality: N/A;   APPENDECTOMY     CESAREAN SECTION     CHOLECYSTECTOMY     CHOLECYSTECTOMY  07/28/2012   Procedure: LAPAROSCOPIC CHOLECYSTECTOMY;  Surgeon: Gwendolyn Bering, MD;  Location: [facility];  Service: General;  Laterality: N/A;   LAPAROSCOPY N/A 09/07/2014   Procedure: LAPAROSCOPY OPERATIVE;  Surgeon: Gwendolyn Phenix, MD;  Location: [facility];  Service: Gynecology;  Laterality: N/A;   removal of lt fallopian tube     UNILATERAL SALPINGECTOMY Right 09/07/2014   Procedure: UNILATERAL SALPINGECTOMY;  Surgeon: Gwendolyn Phenix, MD;  Location: [facility];  Service: Gynecology;  Laterality: Right;   WOUND DEBRIDEMENT Right 02/28/2016   Procedure: DEBRIDEMENT OF RIGHT SHOULDER SPIDER BITE;  Surgeon: Gwendolyn Macho, MD;  Location: [facility];  Service: General;  Laterality: Right;    Social History   Socioeconomic History   Marital status: Married    Spouse name: Not on file   Number of children: Not on file   Years of education: Not  on file   Highest education level: Not on file  Occupational History   Not on file  Tobacco Use   Smoking status: Former    Current packs/day: 0.50    Average packs/day: 0.5 packs/day for 1 year (0.5 ttl pk-yrs)    Types: Cigarettes    Passive exposure: Past   Smokeless tobacco: Never  Vaping Use   Vaping status: Never Used  Substance and Sexual Activity   Alcohol use: Not Currently   Drug use: No   Sexual activity: Not Currently    Birth control/protection: Surgical    Comment: hyst  Other Topics Concern   Not on file  Social History Narrative   Not on file   Social Determinants of Health   Financial Resource Strain: Not on file  Food Insecurity: Not on file  Transportation Needs: Not on file  Physical Activity: Not on file  Stress: Not on file  Social Connections: Not on file    Family History  Problem Relation Age of Onset   Lung cancer Father 44   Colon cancer Neg Hx    Esophageal cancer Neg Hx    Rectal cancer Neg Hx    Stomach cancer Neg Hx    Adrenal disorder Neg Hx    Liver disease Neg Hx    Inflammatory bowel disease Neg Hx    Pancreatic cancer Neg Hx     Outpatient Encounter Medications as of 10/23/2023  Medication Sig   ALPRAZolam (XANAX) 1 MG tablet Take 1 mg by mouth 2 (two) times daily as needed for anxiety or sleep.   buPROPion (WELLBUTRIN XL) 150 MG 24 hr tablet Take 150 mg by mouth every morning.   cholestyramine (QUESTRAN) 4 g packet Take 1 packet (4 g total) by mouth 2 (two) times daily.   cromolyn (GASTROCROM) 100 MG/5ML solution Take by mouth as directed. Takes 8 liquid bottles per day   Cyanocobalamin 1000 MCG/ML KIT Inject 1,000 mcg as directed once a week. Patient is injecting every two weeks.   EPINEPHrine (EPIPEN IJ) Inject as directed. As needed   ergocalciferol (VITAMIN D2) 1.25 MG (50000 UT) capsule 50,000 Units. Patient takes 50,000 units by mouth twice weekly   escitalopram (LEXAPRO) 20 MG tablet Take 20 mg by mouth daily.    famotidine (PEPCID) 40 MG tablet Take 40 mg by mouth daily.   KETOTIFEN FUMARATE OP Take 1 mg by mouth as needed (for cells).   levothyroxine (SYNTHROID) 75 MCG tablet Take 1 tablet (75 mcg total) by mouth daily.   loperamide (IMODIUM A-D) 2 MG tablet Take 2 mg by mouth as needed for diarrhea or loose stools.   metoprolol tartrate (LOPRESSOR) 25 MG tablet Take 0.5 tablets (12.5 mg total) by mouth 2 (two) times daily as needed (palpitations).   omeprazole (PRILOSEC) 40 MG capsule Take 40 mg by mouth in the morning and at bedtime.   potassium chloride (KLOR-CON) 20 MEQ packet Take 2 packets by mouth daily.   promethazine (PHENERGAN) 25 MG tablet Take 25 mg by mouth every 8 (eight) hours as needed for nausea or vomiting.   psyllium (METAMUCIL) 58.6 % packet Take 1 packet by mouth 3 (three) times daily.   [DISCONTINUED] Levothyroxine Sodium 137 MCG/ML SOLN Take 137 mcg by mouth daily before breakfast.   colesevelam (WELCHOL) 625 MG tablet Take 1 tablet (625 mg total) by mouth 2 (two) times daily with a meal.   [DISCONTINUED] 0.9 %  sodium chloride infusion    No facility-administered encounter medications on file as of 10/23/2023.    ALLERGIES: Allergies  Allergen Reactions   Alpha-Gal Anaphylaxis   Cinnamon Swelling and Other (See Comments)    REACTION: throat swells   Bee Venom Swelling and Other (See Comments)    REACTION: All over body swelling   Vicodin [Hydrocodone-Acetaminophen] Nausea And Vomiting   Benadryl [Diphenhydramine]     Couldn't talk or function with injectable- pill gives chest pain and hives and SOB   Dilaudid [Hydromorphone Hcl] Hives   Toradol [Ketorolac Tromethamine]     Hives, and chest pain   VACCINATION STATUS: Immunization History  Administered Date(s) Administered   Influenza,inj,Quad PF,6+ Mos 02/28/2016   Moderna Sars-Covid-2 Vaccination 02/08/2020, 03/07/2020     HPI   Gwendolyn Glass is a patient with the above medical history. she was diagnosed  with hypothyroidism which required subsequent initiation of  thyroid hormone replacement therapy. she was given various doses of Levothyroxine, branded-Synthroid, and Tirosint over the years, currently on Levothyroxine solution 137 micrograms. she reports compliance to this medication:  Taking it daily on empty stomach with water, separated by >30 minutes before breakfast and other medications, and by at least 4 hours from calcium, iron, PPIs, multivitamins.  She notes she is taking cholestyramine daily to help with absorption problems.  She describes her disease course as starting out as diarrhea which came after every meal or liquid.  Then she was diagnosed with alpha-gal about 2 years ago and then, slowly she started to develop other symptoms, dizziness, abdominal cramping, headaches, near syncopal episodes.  She has seen many different specialists, was being worked up for POTS but wanted to have her thyroid managed first at it may be affecting her other symptoms.  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  She has not yet started taking the medication at the times suggested by her GI specialist, wanted to see what we said first..   I reviewed patient's thyroid tests:  Lab Results  Component Value Date   TSH 8.370 (H) 10/14/2023   TSH 15.400 (H) 07/17/2023   TSH 9.510 (H) 04/15/2023   TSH 10.300 (H) 02/11/2023   TSH 24.300 (H) 12/12/2022   TSH 6.420 (H) 10/08/2022   TSH 17.10 (A) 08/17/2022   TSH 11.70 (A) 05/23/2022   TSH 15.20 (A) 04/10/2022   TSH 18.49 (H) 06/22/2021   FREET4 1.17 10/14/2023   FREET4 0.94 07/17/2023   FREET4 1.04 04/15/2023   FREET4 0.90 02/11/2023   FREET4 0.87 12/12/2022   FREET4 1.11 10/08/2022   FREET4 0.90 03/07/2021    Pt denies feeling nodules in neck, hoarseness, dysphagia/odynophagia, SOB with lying down.  she does not know of family history  of thyroid disorders including cancer.  No history of radiation therapy to head or neck.  No recent use of iodine supplements.  Denies use of Biotin containing supplements.  She has had thyroid ultrasound in August 2022 which was normal with exception of noting cell heterogeneity.  I reviewed her chart and she also has a history of GERD, tobacco abuse, malabsorption issues.  -Since last visit she has adjusted her medications to help bile acid diarrhea after doing her own research.  She is doing 1 packet of the cholestyramine and 1 imodium before bed and she is starting to have normal BMs as a result.  She also noted she had trouble getting the Levothyroxine solution thus she had to switch back to old 75 mcg tablets she had left over and started those about 2 weeks ago.   ROS:  Constitutional: + steady body weight, + fatigue, no subjective hyperthermia, no subjective hypothermia, chronic headache Eyes: no blurry vision, no xerophthalmia ENT: no sore throat, no nodules palpated in throat, no dysphagia/odynophagia, no hoarseness Cardiovascular: no chest pain, no SOB, + intermittent palpitations, no leg swelling, dizziness with position changes (being worked up for POTS/dysautonomia) Respiratory: no cough, no SOB Gastrointestinal: no nausea/vomiting, + chronic diarrhea triggered by any oral intake Musculoskeletal: no muscle/joint aches, intermittent diffuse muscle spasms-- ongoing eval with neurology Skin: no rashes Neurological: no tremors, no numbness, no tingling, + intermittent dizziness Psychiatric: no depression, no anxiety   Objective:   Objective     BP 134/72 (BP Location: Left Arm, Patient Position: Sitting, Cuff Size: Large)  Pulse 94   Ht 5\' 8"  (1.727 m)   Wt 264 lb 3.2 oz (119.8 kg)   LMP 04/11/2017 (Exact Date) Comment: has had continual periods  BMI 40.17 kg/m  Wt Readings from Last 3 Encounters:  10/23/23 264 lb 3.2 oz (119.8 kg)  10/20/23 264 lb 6.4 oz (119.9 kg)   07/23/23 260 lb 6.4 oz (118.1 kg)    BP Readings from Last 3 Encounters:  10/23/23 134/72  10/20/23 (!) 132/92  07/23/23 110/72      Physical Exam- Limited  Constitutional:  Body mass index is 40.17 kg/m. , not in acute distress, normal state of mind Eyes:  EOMI, no exophthalmos Musculoskeletal: no gross deformities, strength intact in all four extremities, no gross restriction of joint movements Skin:  no rashes, no hyperemia Neurological: no tremor with outstretched hands    CMP ( most recent) CMP     Component Value Date/Time   NA 142 12/25/2022 1150   NA 142 07/26/2022 1115   K 4.0 12/25/2022 1150   CL 107 12/25/2022 1150   CO2 26 12/25/2022 1150   GLUCOSE 83 12/25/2022 1150   BUN 8 12/25/2022 1150   BUN 8 07/26/2022 1115   CREATININE 0.83 12/25/2022 1150   CALCIUM 9.2 12/25/2022 1150   PROT 6.8 12/25/2022 1150   PROT 6.6 12/25/2022 1150   PROT 6.0 09/28/2020 1443   ALBUMIN 4.2 06/22/2021 1125   AST 26 12/25/2022 1150   ALT 40 (H) 12/25/2022 1150   ALKPHOS 83 06/22/2021 1125   BILITOT 0.4 12/25/2022 1150   GFRNONAA >60 11/10/2020 1622   GFRAA >60 09/17/2018 0525     Diabetic Labs (most recent): No results found for: "HGBA1C", "MICROALBUR"   Lipid Panel ( most recent) Lipid Panel  No results found for: "CHOL", "TRIG", "HDL", "CHOLHDL", "VLDL", "LDLCALC", "LDLDIRECT", "LABVLDL"     Lab Results  Component Value Date   TSH 8.370 (H) 10/14/2023   TSH 15.400 (H) 07/17/2023   TSH 9.510 (H) 04/15/2023   TSH 10.300 (H) 02/11/2023   TSH 24.300 (H) 12/12/2022   TSH 6.420 (H) 10/08/2022   TSH 17.10 (A) 08/17/2022   TSH 11.70 (A) 05/23/2022   TSH 15.20 (A) 04/10/2022   TSH 18.49 (H) 06/22/2021   FREET4 1.17 10/14/2023   FREET4 0.94 07/17/2023   FREET4 1.04 04/15/2023   FREET4 0.90 02/11/2023   FREET4 0.87 12/12/2022   FREET4 1.11 10/08/2022   FREET4 0.90 03/07/2021      Thyroid US from 07/30/22 US Thyroid  Anatomical Region Laterality Modality   Neck -- Ultrasound   Impression  Thyroid heterogeneity without discrete nodule.  The above is in keeping with the ACR TI-RADS recommendations - J Am Coll Radiol 2017;14:587-595.   Electronically Signed   By: Judie Petit.  Shick M.D.   On: 07/30/2021 13:33 Narrative  CLINICAL DATA:  Palpable left thyroid nodule, neck fullness  EXAM: THYROID ULTRASOUND  TECHNIQUE: Ultrasound examination of the thyroid gland and adjacent soft tissues was performed.  COMPARISON:  None.  FINDINGS: Parenchymal Echotexture: Moderately heterogenous  Isthmus: 3 mm  Right lobe: 4.3 x 1.8 x 1.3 cm  Left lobe: 3.6 x 1.8 x 1.3 cm  _________________________________________________________  Estimated total number of nodules >/= 1 cm: 0  Number of spongiform nodules >/=  2 cm not described below (TR1): 0  Number of mixed cystic and solid nodules >/= 1.5 cm not described below (TR2): 0  _________________________________________________________  Nonspecific thyroid heterogeneity suggesting medical thyroid disease. No hypervascularity or nodule.  No adenopathy. Procedure Note  Sherren Mocha, MD - 07/30/2021 Formatting of this note might be different from the original. CLINICAL DATA:  Palpable left thyroid nodule, neck fullness  EXAM: THYROID ULTRASOUND  TECHNIQUE: Ultrasound examination of the thyroid gland and adjacent soft tissues was performed.  COMPARISON:  None.  FINDINGS: Parenchymal Echotexture: Moderately heterogenous  Isthmus: 3 mm  Right lobe: 4.3 x 1.8 x 1.3 cm  Left lobe: 3.6 x 1.8 x 1.3 cm  _________________________________________________________  Estimated total number of nodules >/= 1 cm: 0  Number of spongiform nodules >/=  2 cm not described below (TR1): 0  Number of mixed cystic and solid nodules >/= 1.5 cm not described below (TR2): 0  _________________________________________________________  Nonspecific thyroid heterogeneity suggesting medical  thyroid disease. No hypervascularity or nodule. No adenopathy.  IMPRESSION: Thyroid heterogeneity without discrete nodule.  The above is in keeping with the ACR TI-RADS recommendations - J Am Coll Radiol 2017;14:587-595.   Electronically Signed   By: Judie Petit.  Shick M.D.   On: 07/30/2021 13:33 Exam End: 07/30/21 13:32   Specimen Collected: 07/30/21 13:32 Last Resulted: 07/30/21 13:33  Received From: [facility]      Latest Reference Range & Units 10/08/22 13:57 12/12/22 16:18 02/11/23 15:53 04/15/23 16:40 07/17/23 16:40 10/14/23 12:13  TSH 0.450 - 4.500 uIU/mL 6.420 (H) 24.300 (H) 10.300 (H) 9.510 (H) 15.400 (H) 8.370 (H)  Triiodothyronine,Free,Serum 2.0 - 4.4 pg/mL 2.5       T4,Free(Direct) 0.82 - 1.77 ng/dL 6.21 3.08 6.57 8.46 9.62 1.17  Thyroperoxidase Ab SerPl-aCnc 0 - 34 IU/mL 160 (H)       Thyroglobulin Antibody 0.0 - 0.9 IU/mL 2.2 (H)       (H): Data is abnormally high   Assessment & Plan:   ASSESSMENT / PLAN:  1. Hypothyroidism-r/t Hashimotos thyroiditis  Patient with long-standing hypothyroidism, on thyroid hormone replacement therapy. On physical exam, patient does not have gross goiter, thyroid nodules, or neck compression symptoms.  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  Her previsit TFTs show great improvement on her current regimen of Levothyroxine 75 mcg tablets.  I did send in refill as she had been using up leftover supply from previous dose changes.  Now that her bile acid diarrhea is improving, I suspect her absorption is improving as well.  Will recheck TFTs prior to next visit.  - We discussed about correct intake of levothyroxine, at fasting, with water, separated by at least 30 minutes from breakfast, and separated by more than 4 hours from calcium, iron, multivitamins, acid reflux medications (PPIs). -Patient is made aware of the fact that thyroid hormone replacement is needed for life, dose to be  adjusted by periodic monitoring of thyroid function tests.    -Due to absence of clinical goiter and essentially normal [place] from a year ago, no need for additional thyroid ultrasound at this time.     I spent  23  minutes in the care of the patient today including review of labs from Thyroid Function, CMP, and other relevant labs ; imaging/biopsy records (current and previous including abstractions from other facilities); face-to-face time discussing  her lab results and symptoms, medications doses, her options of short and long term treatment based on the latest standards of care / guidelines;   and documenting the encounter.  Suzette Battiest  participated in the discussions, expressed understanding, and voiced agreement with the above plans.  All questions were answered to her satisfaction. she is  encouraged to contact clinic should she have any questions or concerns prior to her return visit.   FOLLOW UP PLAN:  Return in about 3 months (around 01/23/2024) for Thyroid follow up, Previsit labs.  Ronny Bacon, [facility] [address] [place], [place] [address] Phone: [phone] Fax: [phone]  10/23/2023, 3:52 PM

## 2023-11-04 ENCOUNTER — Encounter (INDEPENDENT_AMBULATORY_CARE_PROVIDER_SITE_OTHER): Payer: Self-pay | Admitting: Gastroenterology

## 2023-11-04 ENCOUNTER — Ambulatory Visit (INDEPENDENT_AMBULATORY_CARE_PROVIDER_SITE_OTHER): Payer: BC Managed Care – PPO | Admitting: Gastroenterology

## 2023-11-04 VITALS — BP 128/88 | HR 99 | Temp 98.0°F | Ht 68.0 in | Wt 258.2 lb

## 2023-11-04 DIAGNOSIS — K529 Noninfective gastroenteritis and colitis, unspecified: Secondary | ICD-10-CM | POA: Diagnosis not present

## 2023-11-04 DIAGNOSIS — R109 Unspecified abdominal pain: Secondary | ICD-10-CM

## 2023-11-04 DIAGNOSIS — K9089 Other intestinal malabsorption: Secondary | ICD-10-CM | POA: Diagnosis not present

## 2023-11-04 DIAGNOSIS — E039 Hypothyroidism, unspecified: Secondary | ICD-10-CM

## 2023-11-04 DIAGNOSIS — Z9049 Acquired absence of other specified parts of digestive tract: Secondary | ICD-10-CM

## 2023-11-04 DIAGNOSIS — R1084 Generalized abdominal pain: Secondary | ICD-10-CM

## 2023-11-04 DIAGNOSIS — Z6841 Body Mass Index (BMI) 40.0 and over, adult: Secondary | ICD-10-CM

## 2023-11-04 MED ORDER — LOPERAMIDE HCL 2 MG PO TABS: 2.0000 mg | ORAL_TABLET | ORAL | 1 refills | Status: AC | PRN

## 2023-11-04 MED ORDER — COLESEVELAM HCL 625 MG PO TABS: 625.0000 mg | ORAL_TABLET | Freq: Two times a day (BID) | ORAL | 2 refills | Status: AC

## 2023-11-04 MED ORDER — CHOLESTYRAMINE 4 G PO PACK: 4.0000 g | PACK | Freq: Two times a day (BID) | ORAL | 6 refills | Status: DC

## 2023-11-04 NOTE — Patient Instructions (Signed)
It was very nice to meet you today, as dicussed with will plan for the following :  1) SIBO testing 2) SID testing  3) Stool sample

## 2023-11-04 NOTE — Progress Notes (Signed)
Gwendolyn Glass , M.D. Gastroenterology & Hepatology [facility] [address] [place], [place] [address] Primary Care Physician: Gwendolyn Moris, PA-C [address] [place] [address]  Chief Complaint:  Chronic diarrhea , abdominal pain   History of Present Illness:  Gwendolyn Glass is a 42 y.o. female with Hashimoto's thyroiditis , alpha gal syndrome, polyarthralgia, prior C. difficile after antibiotics exposure,  cholecystectomy, appendectomy who is following with GI for  chronic diarrhea and abdominal pain  Patient last seen 8/062024.Today  Patient reports today that she continues to have 7-8 liquid bowel movements daily, which is slightly better with addition of Colesevelam with Ccholestyramine.  She has issues increasing cholestyramine because of taking other medications.  She has postprandial bloating and diarrhea with excessive burping  History : Patient has a complex history .  Patient reports that she was diagnosed with alpha gal syndrome after tick bite and is not able to tolerate any mammal products, she does not eat dairy products or red meat.  She has been suffering for diarrhea for over 10 years and he has been worse in the past 3 years.  She reports that she will have large amount of liquid stool in the morning and as the day progresses wanting to get smaller, but she would have around 10 bowel movements daily.  This is affecting her life that she is not able to have a social circle and able to leave the house.  Patient denies any blood in stool.  Patient would wake up in the middle of the night to defecate as well.  She was having abdominal discomfort most of the day worsened with defecation.  Patient reports that when she was diagnosed with alpha gal syndrome she stopped eating and lost 30 pounds at that time but she has been gaining weight  She has significant surgical history as well with appendectomy cholecystectomy tubal  ectopic pregnancy with removal of fallopian tubes and a hysterectomy because of dysfunctional uterine bleeding  Patient had upper endoscopy and colonoscopy(suboptimal prep) with biopsies negative for celiac H. pylori and microscopic colitis   Last [id number]  Impression: - No gross lesions in esophagus proximally. Biopsied. Salmon- colored mucosa suspicious for short- segment Barrett' s esophagus distally. Biopsied to rule in/ out. - Gastritis in antrum/ prepylorus. Discoloration noted in the body to antrum transition. No other gross lesions in the stomach. Biopsied. - No gross lesions in the duodenal bulb, in the first portion of the duodenum and in the second portion of the duodenum. Biopsied.  Last Colonoscopy:2021  Impression: - Preparation of the colon was fair after copious lavage but not adequate for appropriate screening. - Perianal skin tags found on perianal exam. Hemorrhoids found on digital rectal exam. - Stool in the entire examined colon - Lavaged. - The examined portion of the ileum was normal. Biopsied. - Normal mucosa in the entire examined colon when able to be visualized. Biopsied. - Two 2 to 5 mm polyps in the rectum, removed with a cold snare. Resected and retrieved. - Anal papilla( e) were hypertrophied. Non- bleeding non- thrombosed external and internal hemorrhoids.  - Repeat colonoscopy within 1 year because the bowel preparation was suboptimal ( 2- day preparation necessary) this is critical especially if polyps removed return adenomatous.  Diagnosis 1. Surgical [P], duodenal - BENIGN SMALL BOWEL MUCOSA. - NO VILLOUS BLUNTING OR INCREASE IN INTRAEPITHELIAL LYMPHOCYTES. - NO DYSPLASIA OR MALIGNANCY. 2. Surgical [P], gastric antrum - MILD REACTIVE GASTROPATHY WITH  EROSIONS. Gwendolyn Glass IS NEGATIVE FOR HELICOBACTER PYLORI. - NO INTESTINAL METAPLASIA, DYSPLASIA, OR MALIGNANCY. 3. Surgical [P], distal esophagus - REFLUX CHANGES. - NO INTESTINAL METAPLASIA, DYSPLASIA,  OR MALIGNANCY. 4. Surgical [P], esophagus, random sites - BENIGN SQUAMOUS MUCOSA. - NO INCREASE IN INTRAEPITHELIAL EOSINOPHILS. - NO INTESTINAL METAPLASIA, DYSPLASIA, OR MALIGNANCY. 5. Surgical [P], small bowel, terminal ileum (normal looking) - BENIGN SMALL BOWEL MUCOSA. - NO ACTIVE INFLAMMATION. - NO DYSPLASIA OR MALIGNANCY. 6. Surgical [P], colon, random sites - BENIGN COLONIC MUCOSA. - NO ACTIVE INFLAMMATION OR EVIDENCE OF MICROSCOPIC COLITIS. - NO DYSPLASIA OR MALIGNANCY. 7. Surgical [P], colon, rectum, polyp (2) - HYPERPLASTIC POLYP (X2 FRAGMENTS). - NO DYSPLASIA OR MALIGNANCY    FHx: neg for any gastrointestinal/liver disease, no malignancies Social: Ex-smoker ,no alcohol or illicit drug use  Past Medical History: Past Medical History:  Diagnosis Date   Allergy    Allergy to alpha-gal    Allergy to alpha-gal    Anxiety    Zweber recluse spider bite 02/27/2016   C. difficile diarrhea 02/29/2016   Depression    GERD (gastroesophageal reflux disease)    Hypertension    Hypokalemia    Miscarriage    pt. states shes  spotting  and wearing a tampon   Ovarian mass, right 03/01/2016   ? complex cyst   Pregnant    Thyroid disease     Past Surgical History: Past Surgical History:  Procedure Laterality Date   ABDOMINAL HYSTERECTOMY N/A 09/16/2018   Procedure: HYSTERECTOMY ABDOMINAL;  Surgeon: Lazaro Arms, MD;  Location: [facility];  Service: Gynecology;  Laterality: N/A;   APPENDECTOMY     CESAREAN SECTION     CHOLECYSTECTOMY     CHOLECYSTECTOMY  07/28/2012   Procedure: LAPAROSCOPIC CHOLECYSTECTOMY;  Surgeon: Fabio Bering, MD;  Location: [facility];  Service: General;  Laterality: N/A;   LAPAROSCOPY N/A 09/07/2014   Procedure: LAPAROSCOPY OPERATIVE;  Surgeon: Adam Phenix, MD;  Location: [facility];  Service: Gynecology;  Laterality: N/A;   removal of lt fallopian tube     UNILATERAL SALPINGECTOMY Right 09/07/2014   Procedure: UNILATERAL SALPINGECTOMY;  Surgeon: Adam Phenix, MD;  Location: [facility];  Service: Gynecology;  Laterality: Right;   WOUND DEBRIDEMENT Right 02/28/2016   Procedure: DEBRIDEMENT OF RIGHT SHOULDER SPIDER BITE;  Surgeon: Franky Macho, MD;  Location: [facility];  Service: General;  Laterality: Right;    Family History: Family History  Problem Relation Age of Onset   Lung cancer Father 42   Colon cancer Neg Hx    Esophageal cancer Neg Hx    Rectal cancer Neg Hx    Stomach cancer Neg Hx    Adrenal disorder Neg Hx    Liver disease Neg Hx    Inflammatory bowel disease Neg Hx    Pancreatic cancer Neg Hx     Social History: Social History   Tobacco Use  Smoking Status Former   Current packs/day: 0.50   Average packs/day: 0.5 packs/day for 1 year (0.5 ttl pk-yrs)   Types: Cigarettes   Passive exposure: Past  Smokeless Tobacco Never   Social History   Substance and Sexual Activity  Alcohol Use Not Currently   Social History   Substance and Sexual Activity  Drug Use No    Allergies: Allergies  Allergen Reactions   Alpha-Gal Anaphylaxis   Cinnamon Swelling and Other (See Comments)    REACTION: throat swells   Bee Venom Swelling and Other (See Comments)    REACTION: All  over body swelling   Vicodin [Hydrocodone-Acetaminophen] Nausea And Vomiting   Benadryl [Diphenhydramine]     Couldn't talk or function with injectable- pill gives chest pain and hives and SOB   Dilaudid [Hydromorphone Hcl] Hives   Toradol [Ketorolac Tromethamine]     Hives, and chest pain    Medications: Current Outpatient Medications  Medication Sig Dispense Refill   ALPRAZolam (XANAX) 1 MG tablet Take 1 mg by mouth 2 (two) times daily as needed for anxiety or sleep.     buPROPion (WELLBUTRIN XL) 150 MG 24 hr tablet Take 150 mg by mouth every morning.     cholestyramine (QUESTRAN) 4 g packet Take 1 packet (4 g total) by mouth 2 (two) times daily. 60 each 6   colesevelam (WELCHOL) 625 MG tablet Take 1 tablet (625 mg total) by mouth 2 (two) times  daily with a meal. 60 tablet 2   cromolyn (GASTROCROM) 100 MG/5ML solution Take by mouth as directed. Takes 8 liquid bottles per day     Cyanocobalamin 1000 MCG/ML KIT Inject 1,000 mcg as directed once a week. Patient is injecting every two weeks.     EPINEPHrine (EPIPEN IJ) Inject as directed. As needed     ergocalciferol (VITAMIN D2) 1.25 MG (50000 UT) capsule 50,000 Units. Patient takes 50,000 units by mouth twice weekly     escitalopram (LEXAPRO) 20 MG tablet Take 20 mg by mouth daily.     famotidine (PEPCID) 40 MG tablet Take 40 mg by mouth daily.     KETOTIFEN FUMARATE OP Take 1 mg by mouth as needed (for cells).     levothyroxine (SYNTHROID) 75 MCG tablet Take 1 tablet (75 mcg total) by mouth daily. 90 tablet 0   loperamide (IMODIUM A-D) 2 MG tablet Take 2 mg by mouth as needed for diarrhea or loose stools.     metoprolol tartrate (LOPRESSOR) 25 MG tablet Take 0.5 tablets (12.5 mg total) by mouth 2 (two) times daily as needed (palpitations). 30 tablet 2   omeprazole (PRILOSEC) 40 MG capsule Take 40 mg by mouth in the morning and at bedtime.     potassium chloride (KLOR-CON) 20 MEQ packet Take 2 packets by mouth daily.     promethazine (PHENERGAN) 25 MG tablet Take 25 mg by mouth every 8 (eight) hours as needed for nausea or vomiting.     psyllium (METAMUCIL) 58.6 % packet Take 1 packet by mouth 3 (three) times daily. 30 each 12   No current facility-administered medications for this visit.    Review of Systems: GENERAL: negative for malaise, night sweats HEENT: No changes in hearing or vision, no nose bleeds or other nasal problems. NECK: Negative for lumps, goiter, pain and significant neck swelling RESPIRATORY: Negative for cough, wheezing CARDIOVASCULAR: Negative for chest pain, leg swelling, palpitations, orthopnea GI: SEE HPI SKIN: Negative for lesions, rash HEMATOLOGY Negative for prolonged bleeding, bruising easily, and swollen nodes. ENDOCRINE: Negative for cold or heat  intolerance, polyuria, polydipsia and goiter. NEURO: negative for tremor, gait imbalance, syncope and seizures. The remainder of the review of systems is noncontributory.   Physical Exam: BP 128/88 (BP Location: Left Arm, Patient Position: Sitting, Cuff Size: Large)   Pulse 99   Temp 98 F (36.7 C) (Temporal)   Ht 5\' 8"  (1.727 m)   Wt 258 lb 3.2 oz (117.1 kg)   LMP 04/11/2017 (Exact Date) Comment: has had continual periods  BMI 39.26 kg/m  GENERAL: The patient is AO x3, in no acute distress. HEENT:  Head is normocephalic and atraumatic. EOMI are intact. Mouth is well hydrated and without lesions. NECK: Supple. No masses LUNGS: Clear to auscultation. No presence of rhonchi/wheezing/rales. Adequate chest expansion HEART: RRR, normal s1 and s2. ABDOMEN: Soft, nontender, no guarding, no peritoneal signs, and nondistended. BS +. No masses. EXTREMITIES: Without any cyanosis, clubbing, rash, lesions or edema. NEUROLOGIC: AOx3, no focal motor deficit. SKIN: no jaundice, no rashes   Imaging/Labs: as above  I personally reviewed and interpreted the available labs, imaging and endoscopic files. Stool electrolytes were ?cancelled H pylori negative Fecal calprotectin normal  Normal 5-HIAA, Gastrin levels , Glucagon , VIP  Impression and Plan:  Gwendolyn Glass is a 42 y.o. female with Hashimoto's thyroiditis , alpha gal syndrome, polyarthralgia, prior C. difficile after antibiotics exposure,  cholecystectomy, appendectomy who is following with GI for  chronic diarrhea and abdominal pain. 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  Has been previously  evaluated by another gastroenterologist with extent of workup including upper endoscopy and colonoscopy with biopsies negative for H. pylori celiac and microscopic colitis  Workup : Stool studies has been negative for Entamoeba Giardia and negative GI PCR Negative CRP also goes against inflammatory diarrhea Given malabsorption her fecal elastase was also negative Stool electrolytes were ?cancelled H pylori negative Fecal calprotectin normal  Normal 5-HIAA, Gastrin levels , Glucagon , VIP  Patient has tried Metamucil previously takes Imodium daily in the morning and cholestyramine twice a day.  If she does not take cholestyramine and her diarrhea will get worse   SIBO also remains on differential given postprandial bloating, diarrhea flatulence and excessive eructation.  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  Reports low vitamin B12 and is on vitamin B supplements.  We do not have any documentation of low vitamin B 12 in her chart.  H. Pylori nfection has been shown to be positive antiparietal antibody and also had vitamin B12 deficiency although biopsies were negative for H. pylori .   She is hypothyroid and her TSH remains elevated reports unable to absorb thyroxine, would recommend following up with endocrinologist.  Although patient weight is rather stable which is reassuring in her case as it gives a indication she is able to absorb nutrition.  Recommendation:  -Will obtain stool electrolytes and osmolality to calculate stool osmolar gap to distinguish between secretory and osmotic diarrhea  - SIBO testing and Sucrase isomaltase deficiency testing  -Recommended Metamucil starting 1 scoop daily for 1 week followed by 2 scoops daily second week, and 3 scoops daily thereafter, to bulk up stool  -For bile acid diarrhea continue Colesevelam 625 mg tab, 3-6 tab daily with  cholestyramine .Take this medication 4 hours apart from other medicines.  Previously failed colestipol also   -Imodium twice daily  -Previous colonoscopy was suboptimal prep and was recommended to repeat in 1 year , but on final recommendation suggested repeat 5 years (2026)  by Dr. Meridee Score   - Most important follow up with endocrinology to ensure adequate control of hypothyroidism   BMI 40      - walking at a brisk pace/biking at moderate intesity 2.5-5  hours per week     - use pedometer/step counter to track activity     - goal to lose 5-10% of initial body weight     - avoid suagry drinks and juices, use zero calorie beverages     - increase water intake     - eat a low carb diet with plenty of veggies and fruit     - Get sufficient sleep 7-8 hrs nightly     - maitain active lifestyle     - avoid alcohol     - recommend 2-3 cups Coffee daily    All questions were answered. Dicussed all labs, prior endoscopic evaluations and stool testing today again in detail .  Gwendolyn Lawman, MD Gastroenterology and Hepatology [facility]

## 2023-11-08 ENCOUNTER — Encounter (INDEPENDENT_AMBULATORY_CARE_PROVIDER_SITE_OTHER): Payer: Self-pay | Admitting: Gastroenterology

## 2023-11-08 DIAGNOSIS — K9089 Other intestinal malabsorption: Secondary | ICD-10-CM

## 2023-11-10 MED ORDER — CHOLESTYRAMINE LIGHT 4 G PO PACK: 4.0000 g | PACK | Freq: Two times a day (BID) | ORAL | 1 refills | Status: AC

## 2023-11-17 ENCOUNTER — Telehealth (INDEPENDENT_AMBULATORY_CARE_PROVIDER_SITE_OTHER): Payer: Self-pay | Admitting: Gastroenterology

## 2023-11-17 DIAGNOSIS — E7431 Sucrase-isomaltase deficiency: Secondary | ICD-10-CM

## 2023-11-17 MED ORDER — SUCRAID 8500 UNIT/ML PO SOLN: 8500.0000 [IU] | Freq: Three times a day (TID) | ORAL | 1 refills | Status: DC

## 2023-11-17 NOTE — Telephone Encounter (Signed)
Patient has chronic diarrhea checked sucrase activity which is suggested to be low . Will supplement her diet with sacrosidase .

## 2023-11-18 NOTE — Telephone Encounter (Signed)
Discussed results with patient. We do not have samples. There is a form to fill out to send to [facility] for sucraid. Pt is aware this med would be mailed to her and takes a couple of weeks to get started. She does want the med. Form needs to be signed. I will put on your desk to sign when you return to office.

## 2023-11-27 NOTE — Telephone Encounter (Signed)
Dr. Tasia Catchings returned to office today and signed the form. I faxed form. Await response.

## 2023-11-28 ENCOUNTER — Encounter (INDEPENDENT_AMBULATORY_CARE_PROVIDER_SITE_OTHER): Payer: Self-pay | Admitting: *Deleted

## 2023-11-28 NOTE — Telephone Encounter (Signed)
Called [facility] and asked about next step after sending over rx. They did receive the rx and told me they would do the prior auth and put it in cover my meds for Dr. Tasia Catchings to sign. Pt sent mychart message letting her know we are working on this.

## 2023-12-02 ENCOUNTER — Encounter (INDEPENDENT_AMBULATORY_CARE_PROVIDER_SITE_OTHER): Payer: Self-pay | Admitting: *Deleted

## 2023-12-02 NOTE — Telephone Encounter (Signed)
 Called [facility] to check status of sucraid . Pharmacist told me someone would contact her today. I tried to call patient to let her know and received her voicemail. I left a message for her to check her mychart and sent message letting her know pharmacy would reach out to her today and gave her the pharmacy number if she needed to call them. And to let me know if any issues getting medication.

## 2023-12-04 ENCOUNTER — Encounter: Payer: Self-pay | Admitting: *Deleted

## 2023-12-04 NOTE — Telephone Encounter (Signed)
 Yes the denial letter said the doctor could do an appeal. I placed the letter on his desk for when he returns to office on Tuesday. I'm sorry this has been such a lengthy process.

## 2023-12-04 NOTE — Telephone Encounter (Signed)
 Fax from [employer]. Sucraid solution denied. See form on desk for explanation and next steps if you want to do appeal.

## 2023-12-09 ENCOUNTER — Encounter (INDEPENDENT_AMBULATORY_CARE_PROVIDER_SITE_OTHER): Payer: Self-pay | Admitting: *Deleted

## 2023-12-09 NOTE — Telephone Encounter (Signed)
 Appeal letter faxed in. Patient sent mychart message giving her update on medication.

## 2023-12-09 NOTE — Telephone Encounter (Signed)
 Here is the appeal letter to be mailed please :  To Whom It May Concern,  I am writing to formally appeal the denial of coverage for Surosidase for my patient, Gwendolyn Glass, who has been suffering from chronic diarrhea for over 10 years, significantly worsening in the past three years. 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 Clinical History and Current Symptoms: Gwendolyn Glass experiences 7-8 liquid bowel movements daily, which slightly improve with a combination of Colesevelam  and Cholestyramine . However, her ability to increase Cholestyramine  is limited due to interactions with her other medications. She continues to report postprandial bloating, excessive burping, and large volumes of liquid stool, especially in the morning. Her condition is so debilitating that it has significantly impacted her social life and her ability to leave the house. Additionally, she frequently wakes up in the middle of the night to defecate.  Previous Evaluations and Workup:  Gwendolyn Glass has undergone extensive diagnostic testing, including evaluations by other gastroenterologist, which ruled out other potential causes for her chronic diarrhea. Her workup includes:  Negative stool studies for Entamoeba, Giardia, and GI PCR.  Normal CRP, fecal calprotectin, and fecal elastase, ruling out inflammatory and malabsorptive causes.  Negative upper endoscopy and colonoscopy biopsies for H. pylori, celiac disease, and microscopic colitis.  Normal levels of 5-HIAA, gastrin, glucagon , and VIP. 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  Previous Treatment Attempts:  Gwendolyn Glass has tried Metamucil, Imodium , and Cholestyramine , which have provided only limited relief. Cholestyramine , in particular, helps reduce the  frequency of her bowel movements but does not address the underlying enzyme deficiency and comes with its own limitations due to her other medications.  Rationale for Surosidase:  Surosidase (sacrosidase ) is a proven enzyme replacement therapy specifically indicated for patients with congenital sucrase-isomaltase deficiency (CSID) or low sucrase activity. Given Gwendolyn Glass's positive sucrose breath test and refractory symptoms despite conventional therapies, Surosidase represents a targeted and evidence-based treatment for her condition. The lack of this treatment has left her with significant, unresolved symptoms that impair her daily life and mental health.  Request for Coverage:  We strongly believe that a trial of Surosidase is medically necessary for Gwendolyn Glass. This treatment offers the best chance to manage her symptoms effectively, reduce her dependence on Cholestyramine , and improve her quality of life. Thank you for reviewing this appeal and considering the clinical necessity of Surosidase for Gwendolyn Glass. Please do not hesitate to contact me directly at [phone]  if additional information or clarification is needed. I look forward to your timely response to this matter.  Sincerely, Dr Chardae Mulkern Faizan Lakenzie Mcclafferty

## 2023-12-10 ENCOUNTER — Encounter (INDEPENDENT_AMBULATORY_CARE_PROVIDER_SITE_OTHER): Payer: Self-pay | Admitting: *Deleted

## 2023-12-10 NOTE — Telephone Encounter (Signed)
 Dr. Cinderella the appeal was denied and I left a message with our drug rep travis walter to see if there is patient assistance available that she could apply for. I also sent patient a mychart message letting her know I would get back to her after speaking with the drug rep.

## 2023-12-11 NOTE — Telephone Encounter (Signed)
 Drug rep Caron bucco called back and left on voicemail to call [facility] to see what the process if for patient assistance. ( I will call this after noon or tomorrow )   Caron also said best number to call him back at if needed is [phone].

## 2023-12-12 NOTE — Telephone Encounter (Signed)
 I called [facility] at [phone] to see what steps for patient to do for assistance to get sucraid. I was told they spoke with patient yesterday and she has the forms she needs to fill out and they will process once they receive back.

## 2023-12-15 ENCOUNTER — Encounter: Payer: Self-pay | Admitting: Nurse Practitioner

## 2023-12-15 DIAGNOSIS — E7431 Sucrase-isomaltase deficiency: Secondary | ICD-10-CM | POA: Insufficient documentation

## 2023-12-15 MED ORDER — TIROSINT-SOL 75 MCG/ML PO SOLN: 75.0000 ug | Freq: Every day | ORAL | 3 refills | Status: DC

## 2023-12-16 NOTE — Telephone Encounter (Signed)
 Insurance requiring PA for her Tirosint  solution.  Can you please check on that?  She has newly diagnosed sucrase-isomaltase deficiency which makes it difficult to digest starches and sugars, which is in the generic forms of synthetic thyroid  hormone. Because of this, her thyroid  condition is uncontrolled.

## 2023-12-18 ENCOUNTER — Telehealth: Payer: Self-pay | Admitting: *Deleted

## 2023-12-18 NOTE — Telephone Encounter (Signed)
   Dicy Okubo (Key: [id number])   [employer] has not yet replied to your PA request. You may close this dialog, return to your dashboard, and perform other tasks.  To check for an update later, open this request again from your dashboard.  If [employer] has not replied to your request within 24 hours, please reach out to the plan using the phone number located on the back on the [employer] card.

## 2023-12-18 NOTE — Telephone Encounter (Signed)
Patient left a message that she had been getting the run around from the pharmacy about the prescription Tirosint 75 mcg. She states that Alphonzo Lemmings was going to send this in for her. She is not sure if it needs a PA or what.This is why she is calling to see if a PA needs to be done or what. It looks like a prescription was sent in 12/15/2023 to [facility]. Will follow up with Selena Batten and send to the Rx Pa team.

## 2023-12-22 ENCOUNTER — Telehealth: Payer: Self-pay | Admitting: Nurse Practitioner

## 2023-12-22 NOTE — Telephone Encounter (Signed)
Mailed appeal packet and appeal letter from Ronny Bacon, NP to  [employer] [place] Attn: [employer] [address] [address] [place], [place] [address]  Copy of packet and appeal letter in chart .

## 2023-12-24 NOTE — Telephone Encounter (Signed)
Letter of medical necessity was sent to insurance co.

## 2024-01-05 ENCOUNTER — Telehealth (INDEPENDENT_AMBULATORY_CARE_PROVIDER_SITE_OTHER): Payer: Self-pay | Admitting: Gastroenterology

## 2024-01-05 NOTE — Telephone Encounter (Signed)
Hi Toniann Fail ,  Can you please call the patient and tell the patient SIBO testing was negative at [facility],  [name], MD Gastroenterology and Hepatology [facility] Gastroenterology  ============= Hi Mitzie,  Can you please schedule a follow up appointment for this patient in 2-3 months with me?  Thanks,  [name] , MD Gastroenterology and Hepatology [facility] Gastroenterology    ===============

## 2024-01-05 NOTE — Telephone Encounter (Signed)
SIBO testing at [facility]    INTERPRETATION No increase in hydrogen or methane production to diagnostic levels over 2 hours. (Hydrogen >= 20 ppm over baseline or methane >= 10 ppm over baseline, at or before 90 minutes).  Small Intestinal Bacterial Overgrowth is not present.  Richard B. Lorenda Peck, M.D.

## 2024-01-27 LAB — T4, FREE: Free T4: 1.16 ng/dL (ref 0.82–1.77)

## 2024-01-27 LAB — TSH: TSH: 6.44 u[IU]/mL — ABNORMAL HIGH (ref 0.450–4.500)

## 2024-01-27 NOTE — Patient Instructions (Signed)

## 2024-01-29 ENCOUNTER — Encounter: Payer: Self-pay | Admitting: Nurse Practitioner

## 2024-01-29 ENCOUNTER — Ambulatory Visit: Payer: Medicaid Other | Admitting: Nurse Practitioner

## 2024-01-29 VITALS — BP 112/72 | HR 106 | Ht 68.0 in | Wt 254.6 lb

## 2024-01-29 DIAGNOSIS — E063 Autoimmune thyroiditis: Secondary | ICD-10-CM | POA: Diagnosis not present

## 2024-01-29 DIAGNOSIS — E7431 Sucrase-isomaltase deficiency: Secondary | ICD-10-CM

## 2024-01-29 DIAGNOSIS — K529 Noninfective gastroenteritis and colitis, unspecified: Secondary | ICD-10-CM | POA: Diagnosis not present

## 2024-01-29 DIAGNOSIS — K909 Intestinal malabsorption, unspecified: Secondary | ICD-10-CM

## 2024-01-29 MED ORDER — TIROSINT-SOL 88 MCG/ML PO SOLN: 88.0000 ug | Freq: Every day | ORAL | 3 refills | Status: DC

## 2024-01-29 NOTE — Progress Notes (Signed)
 Endocrinology Follow Up Note                                         01/29/2024, 4:48 PM  Subjective:   Subjective    Gwendolyn Glass is a 43 y.o.-year-old female patient being seen in follow up after being seen in consultation for hypothyroidism referred by Lianne Moris, PA-C.   Past Medical History:  Diagnosis Date   Allergy    Allergy to alpha-gal    Allergy to alpha-gal    Anxiety    [place] recluse spider bite 02/27/2016   C. difficile diarrhea 02/29/2016   Depression    GERD (gastroesophageal reflux disease)    Hypertension    Hypokalemia    Miscarriage    pt. states shes  spotting  and wearing a tampon   Ovarian mass, right 03/01/2016   ? complex cyst   Pregnant    Thyroid disease     Past Surgical History:  Procedure Laterality Date   ABDOMINAL HYSTERECTOMY N/A 09/16/2018   Procedure: HYSTERECTOMY ABDOMINAL;  Surgeon: Lazaro Arms, MD;  Location: [facility];  Service: Gynecology;  Laterality: N/A;   APPENDECTOMY     CESAREAN SECTION     CHOLECYSTECTOMY     CHOLECYSTECTOMY  07/28/2012   Procedure: LAPAROSCOPIC CHOLECYSTECTOMY;  Surgeon: Fabio Bering, MD;  Location: [facility];  Service: General;  Laterality: N/A;   LAPAROSCOPY N/A 09/07/2014   Procedure: LAPAROSCOPY OPERATIVE;  Surgeon: Adam Phenix, MD;  Location: [facility];  Service: Gynecology;  Laterality: N/A;   removal of lt fallopian tube     UNILATERAL SALPINGECTOMY Right 09/07/2014   Procedure: UNILATERAL SALPINGECTOMY;  Surgeon: Adam Phenix, MD;  Location: [facility];  Service: Gynecology;  Laterality: Right;   WOUND DEBRIDEMENT Right 02/28/2016   Procedure: DEBRIDEMENT OF RIGHT SHOULDER SPIDER BITE;  Surgeon: Franky Macho, MD;  Location: [facility];  Service: General;  Laterality: Right;    Social History   Socioeconomic History   Marital status: Married    Spouse name: Not on file   Number of children: Not on file   Years of education: Not  on file   Highest education level: Not on file  Occupational History   Not on file  Tobacco Use   Smoking status: Former    Current packs/day: 0.50    Average packs/day: 0.5 packs/day for 1 year (0.5 ttl pk-yrs)    Types: Cigarettes    Passive exposure: Past   Smokeless tobacco: Never  Vaping Use   Vaping status: Never Used  Substance and Sexual Activity   Alcohol use: Not Currently   Drug use: No   Sexual activity: Not Currently    Birth control/protection: Surgical    Comment: hyst  Other Topics Concern   Not on file  Social History Narrative   Not on file   Social Drivers of Health   Financial Resource Strain: Not on file  Food Insecurity: Not on file  Transportation Needs: Not on file  Physical Activity: Not on file  Stress: Not on file  Social Connections: Not on file    Family History  Problem Relation Age of Onset   Lung cancer Father 63   Colon cancer Neg Hx    Esophageal cancer Neg Hx    Rectal cancer Neg Hx    Stomach cancer Neg Hx    Adrenal disorder Neg Hx    Liver disease Neg Hx    Inflammatory bowel disease Neg Hx    Pancreatic cancer Neg Hx     Outpatient Encounter Medications as of 01/29/2024  Medication Sig   ALPRAZolam (XANAX) 1 MG tablet Take 1 mg by mouth 2 (two) times daily as needed for anxiety or sleep.   buPROPion (WELLBUTRIN XL) 150 MG 24 hr tablet Take 150 mg by mouth every morning.   cholestyramine light (PREVALITE) 4 g packet Take 1 packet (4 g total) by mouth 2 (two) times daily.   colesevelam (WELCHOL) 625 MG tablet Take 1 tablet (625 mg total) by mouth 2 (two) times daily with a meal.   cromolyn (GASTROCROM) 100 MG/5ML solution Take by mouth as directed. Takes 8 liquid bottles per day   Cyanocobalamin 1000 MCG/ML KIT Inject 1,000 mcg as directed once a week. Patient is injecting every two weeks.   EPINEPHrine (EPIPEN IJ) Inject as directed. As needed   ergocalciferol (VITAMIN D2) 1.25 MG (50000 UT) capsule 50,000 Units. Patient  takes 50,000 units by mouth twice weekly   escitalopram (LEXAPRO) 20 MG tablet Take 20 mg by mouth daily.   famotidine (PEPCID) 40 MG tablet Take 40 mg by mouth daily.   KETOTIFEN FUMARATE OP Take 1 mg by mouth as needed (for cells).   levothyroxine (SYNTHROID) 88 MCG tablet Take 88 mcg by mouth daily.   loperamide (IMODIUM A-D) 2 MG tablet Take 1 tablet (2 mg total) by mouth as needed for diarrhea or loose stools.   metoprolol tartrate (LOPRESSOR) 25 MG tablet Take 0.5 tablets (12.5 mg total) by mouth 2 (two) times daily as needed (palpitations).   omeprazole (PRILOSEC) 40 MG capsule Take 40 mg by mouth in the morning and at bedtime.   potassium chloride (KLOR-CON) 20 MEQ packet Take 2 packets by mouth daily.   promethazine (PHENERGAN) 25 MG tablet Take 25 mg by mouth every 8 (eight) hours as needed for nausea or vomiting.   psyllium (METAMUCIL) 58.6 % packet Take 1 packet by mouth 3 (three) times daily.   Sacrosidase (SUCRAID) 8500 UNIT/ML SOLN Take 1 mL (8,500 Units total) by mouth 3 (three) times daily.   TIROSINT-SOL 88 MCG/ML SOLN Take 88 mcg by mouth daily before breakfast.   [DISCONTINUED] TIROSINT-SOL 75 MCG/ML SOLN Take 75 mcg by mouth daily before breakfast. (Patient not taking: Reported on 01/29/2024)   No facility-administered encounter medications on file as of 01/29/2024.    ALLERGIES: Allergies  Allergen Reactions   Alpha-Gal Anaphylaxis   Cinnamon Swelling and Other (See Comments)    REACTION: throat swells   Bee Venom Swelling and Other (See Comments)    REACTION: All over body swelling   Vicodin [Hydrocodone-Acetaminophen] Nausea And Vomiting   Benadryl [Diphenhydramine]     Couldn't talk or function with injectable- pill gives chest pain and hives and SOB   Dilaudid [Hydromorphone Hcl] Hives   Toradol [Ketorolac Tromethamine]     Hives, and chest pain   VACCINATION STATUS: Immunization History  Administered Date(s) Administered   Influenza,inj,Quad PF,6+ Mos  02/28/2016   Moderna Sars-Covid-2 Vaccination 02/08/2020,  03/07/2020     HPI   Gwendolyn Glass is a patient with the above medical history. she was diagnosed with hypothyroidism which required subsequent initiation of thyroid hormone replacement therapy. she was given various doses of Levothyroxine, branded-Synthroid, and Tirosint over the years, currently on Levothyroxine solution 137 micrograms. she reports compliance to this medication:  Taking it daily on empty stomach with water, separated by >30 minutes before breakfast and other medications, and by at least 4 hours from calcium, iron, PPIs, multivitamins.  She notes she is taking cholestyramine daily to help with absorption problems.  She describes her disease course as starting out as diarrhea which came after every meal or liquid.  Then she was diagnosed with alpha-gal about 2 years ago and then, slowly she started to develop other symptoms, dizziness, abdominal cramping, headaches, near syncopal episodes.  She has seen many different specialists, was being worked up for POTS but wanted to have her thyroid managed first at it may be affecting her other symptoms.  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  She has not yet started taking the medication at the times suggested by her GI specialist, wanted to see what we said first..   I reviewed patient's thyroid tests:  Lab Results  Component Value Date   TSH 6.440 (H) 01/26/2024   TSH 8.370 (H) 10/14/2023   TSH 15.400 (H) 07/17/2023   TSH 9.510 (H) 04/15/2023   TSH 10.300 (H) 02/11/2023   TSH 24.300 (H) 12/12/2022   TSH 6.420 (H) 10/08/2022   TSH 17.10 (A) 08/17/2022   TSH 11.70 (A) 05/23/2022   TSH 15.20 (A) 04/10/2022   FREET4 1.16 01/26/2024   FREET4 1.17 10/14/2023   FREET4 0.94 07/17/2023   FREET4 1.04 04/15/2023   FREET4 0.90 02/11/2023   FREET4 0.87  12/12/2022   FREET4 1.11 10/08/2022   FREET4 0.90 03/07/2021    Pt denies feeling nodules in neck, hoarseness, dysphagia/odynophagia, SOB with lying down.  she does not know of family history of thyroid disorders including cancer.  No history of radiation therapy to head or neck.  No recent use of iodine supplements.  Denies use of Biotin containing supplements.  She has had thyroid ultrasound in August 2022 which was normal with exception of noting cell heterogeneity.  I reviewed her chart and she also has a history of GERD, tobacco abuse, malabsorption issues.   ROS:  Constitutional: + steady body weight, + fatigue, no subjective hyperthermia, no subjective hypothermia, chronic headache Eyes: no blurry vision, no xerophthalmia ENT: no sore throat, no nodules palpated in throat, no dysphagia/odynophagia, no hoarseness Cardiovascular: no chest pain, no SOB, + intermittent palpitations, no leg swelling, dizziness with position changes (being worked up for POTS/dysautonomia) Respiratory: no cough, no SOB Gastrointestinal: no nausea/vomiting, + chronic diarrhea triggered by any oral intake Musculoskeletal: no muscle/joint aches, intermittent diffuse muscle spasms-- ongoing eval with neurology Skin: no rashes Neurological: no tremors, no numbness, no tingling, + intermittent dizziness Psychiatric: no depression, no anxiety   Objective:   Objective     BP 112/72 (BP Location: Left Arm, Patient Position: Sitting, Cuff Size: Large)   Pulse (!) 106   Ht 5\' 8"  (1.727 m)   Wt 254 lb 9.6 oz (115.5 kg)   LMP 04/11/2017 (Exact Date) Comment: has had continual periods  BMI 38.71 kg/m  Wt Readings from Last 3 Encounters:  01/29/24 254 lb 9.6 oz (115.5 kg)  11/04/23 258 lb 3.2 oz (117.1 kg)  10/23/23 264 lb 3.2 oz (119.8 kg)    BP Readings from Last 3 Encounters:  01/29/24 112/72  11/04/23 128/88  10/23/23 134/72      Physical Exam- Limited  Constitutional:  Body mass index is  38.71 kg/m. , not in acute distress, normal state of mind Eyes:  EOMI, no exophthalmos Musculoskeletal: no gross deformities, strength intact in all four extremities, no gross restriction of joint movements Skin:  no rashes, no hyperemia Neurological: no tremor with outstretched hands    CMP ( most recent) CMP     Component Value Date/Time   NA 142 12/25/2022 1150   NA 142 07/26/2022 1115   K 4.0 12/25/2022 1150   CL 107 12/25/2022 1150   CO2 26 12/25/2022 1150   GLUCOSE 83 12/25/2022 1150   BUN 8 12/25/2022 1150   BUN 8 07/26/2022 1115   CREATININE 0.83 12/25/2022 1150   CALCIUM 9.2 12/25/2022 1150   PROT 6.8 12/25/2022 1150   PROT 6.6 12/25/2022 1150   PROT 6.0 09/28/2020 1443   ALBUMIN 4.2 06/22/2021 1125   AST 26 12/25/2022 1150   ALT 40 (H) 12/25/2022 1150   ALKPHOS 83 06/22/2021 1125   BILITOT 0.4 12/25/2022 1150   GFRNONAA >60 11/10/2020 1622   GFRAA >60 09/17/2018 0525     Diabetic Labs (most recent): No results found for: "HGBA1C", "MICROALBUR"   Lipid Panel ( most recent) Lipid Panel  No results found for: "CHOL", "TRIG", "HDL", "CHOLHDL", "VLDL", "LDLCALC", "LDLDIRECT", "LABVLDL"     Lab Results  Component Value Date   TSH 6.440 (H) 01/26/2024   TSH 8.370 (H) 10/14/2023   TSH 15.400 (H) 07/17/2023   TSH 9.510 (H) 04/15/2023   TSH 10.300 (H) 02/11/2023   TSH 24.300 (H) 12/12/2022   TSH 6.420 (H) 10/08/2022   TSH 17.10 (A) 08/17/2022   TSH 11.70 (A) 05/23/2022   TSH 15.20 (A) 04/10/2022   FREET4 1.16 01/26/2024   FREET4 1.17 10/14/2023   FREET4 0.94 07/17/2023   FREET4 1.04 04/15/2023   FREET4 0.90 02/11/2023   FREET4 0.87 12/12/2022   FREET4 1.11 10/08/2022   FREET4 0.90 03/07/2021      Thyroid US from 07/30/22 US Thyroid  Anatomical Region Laterality Modality  Neck -- Ultrasound   Impression  Thyroid heterogeneity without discrete nodule.  The above is in keeping with the ACR TI-RADS recommendations - J Am Coll Radiol  2017;14:587-595.   Electronically Signed   By: Judie Petit.  Shick M.D.   On: 07/30/2021 13:33 Narrative  CLINICAL DATA:  Palpable left thyroid nodule, neck fullness  EXAM: THYROID ULTRASOUND  TECHNIQUE: Ultrasound examination of the thyroid gland and adjacent soft tissues was performed.  COMPARISON:  None.  FINDINGS: Parenchymal Echotexture: Moderately heterogenous  Isthmus: 3 mm  Right lobe: 4.3 x 1.8 x 1.3 cm  Left lobe: 3.6 x 1.8 x 1.3 cm  _________________________________________________________  Estimated total number of nodules >/= 1 cm: 0  Number of spongiform nodules >/=  2 cm not described below (TR1): 0  Number of mixed cystic and solid nodules >/= 1.5 cm not described below (TR2): 0  _________________________________________________________  Nonspecific thyroid heterogeneity suggesting medical thyroid disease. No hypervascularity or nodule. No adenopathy. Procedure Note  Sherren Mocha, MD - 07/30/2021 Formatting of this note might be different from the original. CLINICAL DATA:  Palpable left thyroid nodule, neck fullness  EXAM: THYROID ULTRASOUND  TECHNIQUE: Ultrasound  examination of the thyroid gland and adjacent soft tissues was performed.  COMPARISON:  None.  FINDINGS: Parenchymal Echotexture: Moderately heterogenous  Isthmus: 3 mm  Right lobe: 4.3 x 1.8 x 1.3 cm  Left lobe: 3.6 x 1.8 x 1.3 cm  _________________________________________________________  Estimated total number of nodules >/= 1 cm: 0  Number of spongiform nodules >/=  2 cm not described below (TR1): 0  Number of mixed cystic and solid nodules >/= 1.5 cm not described below (TR2): 0  _________________________________________________________  Nonspecific thyroid heterogeneity suggesting medical thyroid disease. No hypervascularity or nodule. No adenopathy.  IMPRESSION: Thyroid heterogeneity without discrete nodule.  The above is in keeping with the ACR TI-RADS  recommendations - J Am Coll Radiol 2017;14:587-595.   Electronically Signed   By: Judie Petit.  Shick M.D.   On: 07/30/2021 13:33 Exam End: 07/30/21 13:32   Specimen Collected: 07/30/21 13:32 Last Resulted: 07/30/21 13:33  Received From: [facility]      Latest Reference Range & Units 12/12/22 16:18 02/11/23 15:53 04/15/23 16:40 07/17/23 16:40 10/14/23 12:13 01/26/24 16:20  TSH 0.450 - 4.500 uIU/mL 24.300 (H) 10.300 (H) 9.510 (H) 15.400 (H) 8.370 (H) 6.440 (H)  T4,Free(Direct) 0.82 - 1.77 ng/dL 1.61 0.96 0.45 4.09 8.11 1.16  (H): Data is abnormally high   Assessment & Plan:   ASSESSMENT / PLAN:  1. Hypothyroidism-r/t Hashimotos thyroiditis  Patient with long-standing hypothyroidism, on thyroid hormone replacement therapy. On physical exam, patient does not have gross goiter, thyroid nodules, or neck compression symptoms.  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  Her previsit TFTs show great improvement on her current regimen of Levothyroxine 88 mcg tablets.  Her insurance has denied Tirosint solution.  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  Her GI specialist put her on medication for her before meals but she needs the Tirosint solution which does not have sucrose in its formulation.  I did write letter of appeal and patient has also sent in request for appeal.  I did send in Tirosint 88 mcg solution to a specialty pharmacy that will give discount for those whose insurance does not cover it.  Will recheck TFTs prior to next visit (in 6 weeks to assess progress) and then again prior to next visit.  - We discussed about correct intake of levothyroxine, at fasting, with water, separated by at least 30 minutes from breakfast, and separated by more than 4 hours from calcium, iron, multivitamins, acid reflux medications (PPIs). -Patient is made aware of the fact that thyroid hormone  replacement is needed for life, dose to be adjusted by periodic monitoring of thyroid function tests.    -Due to absence of clinical goiter and essentially normal [place] from a year ago, no need for additional thyroid ultrasound at this time.     I spent  25  minutes in the care of the patient today including review of labs from Thyroid Function, CMP, and other relevant labs ; imaging/biopsy records (current and previous including abstractions from other facilities); face-to-face time discussing  her lab results and symptoms, medications doses, her options of short and long term treatment based on the latest standards of care / guidelines;   and documenting the encounter.  Suzette Battiest  participated in the discussions, expressed understanding, and voiced agreement with the above plans.  All questions were answered to her satisfaction. she is encouraged to contact clinic should she have any questions or concerns prior to  her return visit.   FOLLOW UP PLAN:  Return in about 3 months (around 04/27/2024) for Thyroid follow up, Previsit labs.  Ronny Bacon, [facility] [address] [place], [place] [address] Phone: [phone] Fax: [phone]  01/29/2024, 4:48 PM

## 2024-02-03 ENCOUNTER — Telehealth: Payer: Self-pay | Admitting: Nurse Practitioner

## 2024-02-03 NOTE — Telephone Encounter (Signed)
 Called and lvm and sent mychart message for pt to come by and sign [facility] form.

## 2024-03-18 ENCOUNTER — Encounter (INDEPENDENT_AMBULATORY_CARE_PROVIDER_SITE_OTHER): Payer: Self-pay | Admitting: Gastroenterology

## 2024-03-18 ENCOUNTER — Encounter: Payer: Self-pay | Admitting: Gastroenterology

## 2024-04-20 LAB — TSH: TSH: 12 u[IU]/mL — ABNORMAL HIGH (ref 0.450–4.500)

## 2024-04-20 LAB — T4, FREE: Free T4: 1.01 ng/dL (ref 0.82–1.77)

## 2024-04-27 ENCOUNTER — Telehealth (INDEPENDENT_AMBULATORY_CARE_PROVIDER_SITE_OTHER): Payer: Self-pay

## 2024-04-27 NOTE — Telephone Encounter (Signed)
 [facility] sent a letter stating they were unable to reach the patient to refill Sucraid . I reached out to the patient and made her aware to call [facility]. Patient states she will call them when we hang up the phone.

## 2024-04-28 ENCOUNTER — Ambulatory Visit (INDEPENDENT_AMBULATORY_CARE_PROVIDER_SITE_OTHER): Admitting: Gastroenterology

## 2024-04-28 ENCOUNTER — Encounter: Payer: Self-pay | Admitting: Nurse Practitioner

## 2024-04-28 ENCOUNTER — Ambulatory Visit: Payer: Medicaid Other | Admitting: Nurse Practitioner

## 2024-04-28 VITALS — BP 120/86 | HR 87 | Ht 68.0 in | Wt 247.0 lb

## 2024-04-28 DIAGNOSIS — E236 Other disorders of pituitary gland: Secondary | ICD-10-CM

## 2024-04-28 DIAGNOSIS — K909 Intestinal malabsorption, unspecified: Secondary | ICD-10-CM

## 2024-04-28 DIAGNOSIS — E7431 Sucrase-isomaltase deficiency: Secondary | ICD-10-CM

## 2024-04-28 DIAGNOSIS — E063 Autoimmune thyroiditis: Secondary | ICD-10-CM | POA: Diagnosis not present

## 2024-04-28 MED ORDER — LEVOTHYROXINE SODIUM 100 MCG PO TABS: 100.0000 ug | ORAL_TABLET | ORAL | 1 refills | Status: DC

## 2024-04-28 MED ORDER — LEVOTHYROXINE SODIUM 88 MCG PO TABS: 88.0000 ug | ORAL_TABLET | ORAL | 1 refills | Status: DC

## 2024-04-28 NOTE — Progress Notes (Signed)
 Endocrinology Follow Up Note                                         04/28/2024, 4:21 PM  Subjective:   Subjective    Gwendolyn Glass is a 43 y.o.-year-old female patient being seen in follow up after being seen in consultation for hypothyroidism referred by Fredick Jarred, PA-C.   Past Medical History:  Diagnosis Date   Allergy     Allergy  to alpha-gal    Allergy  to alpha-gal    Anxiety    [place] recluse spider bite 02/27/2016   C. difficile diarrhea 02/29/2016   Depression    GERD (gastroesophageal reflux disease)    Hypertension    Hypokalemia    Miscarriage    pt. states shes  spotting  and wearing a tampon   Ovarian mass, right 03/01/2016   ? complex cyst   Pregnant    Thyroid  disease     Past Surgical History:  Procedure Laterality Date   ABDOMINAL HYSTERECTOMY N/A 09/16/2018   Procedure: HYSTERECTOMY ABDOMINAL;  Surgeon: Wendelyn Halter, MD;  Location: [facility];  Service: Gynecology;  Laterality: N/A;   APPENDECTOMY     CESAREAN SECTION     CHOLECYSTECTOMY     CHOLECYSTECTOMY  07/28/2012   Procedure: LAPAROSCOPIC CHOLECYSTECTOMY;  Surgeon: Lovena Rubinstein, MD;  Location: [facility];  Service: General;  Laterality: N/A;   LAPAROSCOPY N/A 09/07/2014   Procedure: LAPAROSCOPY OPERATIVE;  Surgeon: Tresia Fruit, MD;  Location: [facility];  Service: Gynecology;  Laterality: N/A;   removal of lt fallopian tube     UNILATERAL SALPINGECTOMY Right 09/07/2014   Procedure: UNILATERAL SALPINGECTOMY;  Surgeon: Tresia Fruit, MD;  Location: [facility];  Service: Gynecology;  Laterality: Right;   WOUND DEBRIDEMENT Right 02/28/2016   Procedure: DEBRIDEMENT OF RIGHT SHOULDER SPIDER BITE;  Surgeon: Alanda Allegra, MD;  Location: [facility];  Service: General;  Laterality: Right;    Social History   Socioeconomic History   Marital status: Married    Spouse name: Not on file   Number of children: Not on file   Years of education: Not  on file   Highest education level: Not on file  Occupational History   Not on file  Tobacco Use   Smoking status: Former    Current packs/day: 0.50    Average packs/day: 0.5 packs/day for 1 year (0.5 ttl pk-yrs)    Types: Cigarettes    Passive exposure: Past   Smokeless tobacco: Never  Vaping Use   Vaping status: Never Used  Substance and Sexual Activity   Alcohol use: Not Currently   Drug use: No   Sexual activity: Not Currently    Birth control/protection: Surgical    Comment: hyst  Other Topics Concern   Not on file  Social History Narrative   Not on file   Social Drivers of Health   Financial Resource Strain: Not on file  Food Insecurity: Not on file  Transportation Needs: Not on file  Physical Activity: Not on file  Stress: Not on file  Social Connections: Not on file    Family History  Problem Relation Age of Onset   Lung cancer Father 73   Colon cancer Neg Hx    Esophageal cancer Neg Hx    Rectal cancer Neg Hx    Stomach cancer Neg Hx    Adrenal disorder Neg Hx    Liver disease Neg Hx    Inflammatory bowel disease Neg Hx    Pancreatic cancer Neg Hx     Outpatient Encounter Medications as of 04/28/2024  Medication Sig   ALPRAZolam  (XANAX ) 1 MG tablet Take 1 mg by mouth 2 (two) times daily as needed for anxiety or sleep.   buPROPion (WELLBUTRIN XL) 150 MG 24 hr tablet Take 150 mg by mouth every morning.   cholestyramine  light (PREVALITE ) 4 g packet Take 1 packet (4 g total) by mouth 2 (two) times daily.   colesevelam  (WELCHOL ) 625 MG tablet Take 1 tablet (625 mg total) by mouth 2 (two) times daily with a meal.   cromolyn (GASTROCROM) 100 MG/5ML solution Take by mouth as directed. Takes 8 liquid bottles per day   Cyanocobalamin 1000 MCG/ML KIT Inject 1,000 mcg as directed once a week. Patient is injecting every two weeks.   EPINEPHrine  (EPIPEN  IJ) Inject as directed. As needed   ergocalciferol  (VITAMIN D2) 1.25 MG (50000 UT) capsule 50,000 Units. Patient  takes 50,000 units by mouth twice weekly   escitalopram  (LEXAPRO ) 20 MG tablet Take 20 mg by mouth daily.   famotidine (PEPCID) 40 MG tablet Take 40 mg by mouth daily.   KETOTIFEN FUMARATE OP Take 1 mg by mouth as needed (for cells).   levothyroxine  (SYNTHROID ) 100 MCG tablet Take 1 tablet (100 mcg total) by mouth every other day.   loperamide  (IMODIUM  A-D) 2 MG tablet Take 1 tablet (2 mg total) by mouth as needed for diarrhea or loose stools.   metoprolol  tartrate (LOPRESSOR ) 25 MG tablet Take 0.5 tablets (12.5 mg total) by mouth 2 (two) times daily as needed (palpitations).   omeprazole (PRILOSEC) 40 MG capsule Take 40 mg by mouth in the morning and at bedtime.   potassium chloride  (KLOR-CON ) 20 MEQ packet Take 2 packets by mouth daily.   promethazine  (PHENERGAN ) 25 MG tablet Take 25 mg by mouth every 8 (eight) hours as needed for nausea or vomiting.   psyllium (METAMUCIL) 58.6 % packet Take 1 packet by mouth 3 (three) times daily.   Sacrosidase  (SUCRAID ) 8500 UNIT/ML SOLN Take 1 mL (8,500 Units total) by mouth 3 (three) times daily.   [DISCONTINUED] levothyroxine  (SYNTHROID ) 88 MCG tablet Take 88 mcg by mouth daily.   levothyroxine  (SYNTHROID ) 88 MCG tablet Take 1 tablet (88 mcg total) by mouth every other day.   [DISCONTINUED] TIROSINT -SOL 88 MCG/ML SOLN Take 88 mcg by mouth daily before breakfast. (Patient not taking: Reported on 04/28/2024)   No facility-administered encounter medications on file as of 04/28/2024.    ALLERGIES: Allergies  Allergen Reactions   Alpha-Gal Anaphylaxis   Cinnamon Swelling and Other (See Comments)    REACTION: throat swells   Bee Venom Swelling and Other (See Comments)    REACTION: All over body swelling   Vicodin [Hydrocodone -Acetaminophen ] Nausea And Vomiting   Benadryl  [Diphenhydramine ]     Couldn't talk or function with injectable- pill gives chest pain and hives and SOB   Dilaudid  [Hydromorphone  Hcl] Hives   Toradol  [Ketorolac  Tromethamine ]      Hives, and chest  pain   VACCINATION STATUS: Immunization History  Administered Date(s) Administered   Influenza,inj,Quad PF,6+ Mos 02/28/2016   Moderna Sars-Covid-2 Vaccination 02/08/2020, 03/07/2020     HPI   [name] is a patient with the above medical history. she was diagnosed with hypothyroidism which required subsequent initiation of thyroid  hormone replacement therapy. she was given various doses of Levothyroxine , branded-Synthroid , and Tirosint  over the years, currently on Levothyroxine  solution 137 micrograms. she reports compliance to this medication:  Taking it daily on empty stomach with water, separated by >30 minutes before breakfast and other medications, and by at least 4 hours from calcium, iron, PPIs, multivitamins.  She notes she is taking cholestyramine  daily to help with absorption problems.  She describes her disease course as starting out as diarrhea which came after every meal or liquid.  Then she was diagnosed with alpha-gal about 2 years ago and then, slowly she started to develop other symptoms, dizziness, abdominal cramping, headaches, near syncopal episodes.  She has seen many different specialists, was being worked up for POTS but wanted to have her thyroid  managed first at it may be affecting her other symptoms.  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  She has not yet started taking the medication at the times suggested by her GI specialist, wanted to see what we said first..   I reviewed patient's thyroid  tests:  Lab Results  Component Value Date   TSH 12.000 (H) 04/19/2024   TSH 6.440 (H) 01/26/2024   TSH 8.370 (H) 10/14/2023   TSH 15.400 (H) 07/17/2023   TSH 9.510 (H) 04/15/2023   TSH 10.300 (H) 02/11/2023   TSH 24.300 (H) 12/12/2022   TSH 6.420 (H) 10/08/2022   TSH 17.10 (A) 08/17/2022   TSH 11.70 (A) 05/23/2022   FREET4 1.01  04/19/2024   FREET4 1.16 01/26/2024   FREET4 1.17 10/14/2023   FREET4 0.94 07/17/2023   FREET4 1.04 04/15/2023   FREET4 0.90 02/11/2023   FREET4 0.87 12/12/2022   FREET4 1.11 10/08/2022   FREET4 0.90 03/07/2021    Pt denies feeling nodules in neck, hoarseness, dysphagia/odynophagia, SOB with lying down.  she does not know of family history of thyroid  disorders including cancer.  No history of radiation therapy to head or neck.  No recent use of iodine supplements.  Denies use of Biotin containing supplements.  She has had thyroid  ultrasound in August 2022 which was normal with exception of noting cell heterogeneity.  I reviewed her chart and she also has a history of GERD, tobacco abuse, malabsorption issues.   ROS:  Constitutional: + steady body weight, + fatigue, no subjective hyperthermia, no subjective hypothermia, chronic headache Eyes: no blurry vision, no xerophthalmia ENT: no sore throat, no nodules palpated in throat, no dysphagia/odynophagia, no hoarseness Cardiovascular: no chest pain, no SOB, + intermittent palpitations, no leg swelling, dizziness with position changes (being worked up for POTS/dysautonomia) Respiratory: no cough, no SOB Gastrointestinal: no nausea/vomiting, + chronic diarrhea triggered by any oral intake Musculoskeletal: no muscle/joint aches, intermittent diffuse muscle spasms-- ongoing eval with neurology Skin: no rashes Neurological: no tremors, no numbness, no tingling, + intermittent dizziness Psychiatric: no depression, no anxiety   Objective:   Objective     BP 120/86 (BP Location: Left Arm, Patient Position: Sitting)   Pulse 87   Ht 5\' 8"  (1.727 m)   Wt 247 lb (112 kg)  LMP 04/11/2017 (Exact Date) Comment: has had continual periods  BMI 37.56 kg/m  Wt Readings from Last 3 Encounters:  04/28/24 247 lb (112 kg)  01/29/24 254 lb 9.6 oz (115.5 kg)  11/04/23 258 lb 3.2 oz (117.1 kg)    BP Readings from Last 3 Encounters:  04/28/24  120/86  01/29/24 112/72  11/04/23 128/88      Physical Exam- Limited  Constitutional:  Body mass index is 37.56 kg/m. , not in acute distress, normal state of mind Eyes:  EOMI, no exophthalmos Musculoskeletal: no gross deformities, strength intact in all four extremities, no gross restriction of joint movements Skin:  no rashes, no hyperemia Neurological: no tremor with outstretched hands    CMP ( most recent) CMP     Component Value Date/Time   NA 142 12/25/2022 1150   NA 142 07/26/2022 1115   K 4.0 12/25/2022 1150   CL 107 12/25/2022 1150   CO2 26 12/25/2022 1150   GLUCOSE 83 12/25/2022 1150   BUN 8 12/25/2022 1150   BUN 8 07/26/2022 1115   CREATININE 0.83 12/25/2022 1150   CALCIUM 9.2 12/25/2022 1150   PROT 6.8 12/25/2022 1150   PROT 6.6 12/25/2022 1150   PROT 6.0 09/28/2020 1443   ALBUMIN 4.2 06/22/2021 1125   AST 26 12/25/2022 1150   ALT 40 (H) 12/25/2022 1150   ALKPHOS 83 06/22/2021 1125   BILITOT 0.4 12/25/2022 1150   GFRNONAA >60 11/10/2020 1622   GFRAA >60 09/17/2018 0525     Diabetic Labs (most recent): No results found for: "HGBA1C", "MICROALBUR"   Lipid Panel ( most recent) Lipid Panel  No results found for: "CHOL", "TRIG", "HDL", "CHOLHDL", "VLDL", "LDLCALC", "LDLDIRECT", "LABVLDL"     Lab Results  Component Value Date   TSH 12.000 (H) 04/19/2024   TSH 6.440 (H) 01/26/2024   TSH 8.370 (H) 10/14/2023   TSH 15.400 (H) 07/17/2023   TSH 9.510 (H) 04/15/2023   TSH 10.300 (H) 02/11/2023   TSH 24.300 (H) 12/12/2022   TSH 6.420 (H) 10/08/2022   TSH 17.10 (A) 08/17/2022   TSH 11.70 (A) 05/23/2022   FREET4 1.01 04/19/2024   FREET4 1.16 01/26/2024   FREET4 1.17 10/14/2023   FREET4 0.94 07/17/2023   FREET4 1.04 04/15/2023   FREET4 0.90 02/11/2023   FREET4 0.87 12/12/2022   FREET4 1.11 10/08/2022   FREET4 0.90 03/07/2021      Thyroid  US  from 07/30/22 US  Thyroid   Anatomical Region Laterality Modality  Neck -- Ultrasound    Impression  Thyroid  heterogeneity without discrete nodule.  The above is in keeping with the ACR TI-RADS recommendations - J Am Coll Radiol 2017;14:587-595.   Electronically Signed   By: Melven Stable.  Shick M.D.   On: 07/30/2021 13:33 Narrative  CLINICAL DATA:  Palpable left thyroid  nodule, neck fullness  EXAM: THYROID  ULTRASOUND  TECHNIQUE: Ultrasound examination of the thyroid  gland and adjacent soft tissues was performed.  COMPARISON:  None.  FINDINGS: Parenchymal Echotexture: Moderately heterogenous  Isthmus: 3 mm  Right lobe: 4.3 x 1.8 x 1.3 cm  Left lobe: 3.6 x 1.8 x 1.3 cm  _________________________________________________________  Estimated total number of nodules >/= 1 cm: 0  Number of spongiform nodules >/=  2 cm not described below (TR1): 0  Number of mixed cystic and solid nodules >/= 1.5 cm not described below (TR2): 0  _________________________________________________________  Nonspecific thyroid  heterogeneity suggesting medical thyroid  disease. No hypervascularity or nodule. No adenopathy. Procedure Note  Andrew Kearns, MD - 07/30/2021 Formatting of  this note might be different from the original. CLINICAL DATA:  Palpable left thyroid  nodule, neck fullness  EXAM: THYROID  ULTRASOUND  TECHNIQUE: Ultrasound examination of the thyroid  gland and adjacent soft tissues was performed.  COMPARISON:  None.  FINDINGS: Parenchymal Echotexture: Moderately heterogenous  Isthmus: 3 mm  Right lobe: 4.3 x 1.8 x 1.3 cm  Left lobe: 3.6 x 1.8 x 1.3 cm  _________________________________________________________  Estimated total number of nodules >/= 1 cm: 0  Number of spongiform nodules >/=  2 cm not described below (TR1): 0  Number of mixed cystic and solid nodules >/= 1.5 cm not described below (TR2): 0  _________________________________________________________  Nonspecific thyroid  heterogeneity suggesting medical thyroid  disease. No  hypervascularity or nodule. No adenopathy.  IMPRESSION: Thyroid  heterogeneity without discrete nodule.  The above is in keeping with the ACR TI-RADS recommendations - J Am Coll Radiol 2017;14:587-595.   Electronically Signed   By: Melven Stable.  Shick M.D.   On: 07/30/2021 13:33 Exam End: 07/30/21 13:32   Specimen Collected: 07/30/21 13:32 Last Resulted: 07/30/21 13:33  Received From: [facility]      Latest Reference Range & Units 07/17/23 16:40 10/14/23 12:13 01/26/24 16:20 04/19/24 13:51  TSH 0.450 - 4.500 uIU/mL 15.400 (H) 8.370 (H) 6.440 (H) 12.000 (H)  T4,Free(Direct) 0.82 - 1.77 ng/dL 1.61 0.96 0.45 4.09  (H): Data is abnormally high   Assessment & Plan:   ASSESSMENT / PLAN:  1. Hypothyroidism-r/t Hashimotos thyroiditis  Patient with long-standing hypothyroidism, on thyroid  hormone replacement therapy. On physical exam, patient does not have gross goiter, thyroid  nodules, or neck compression symptoms.  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  Her previsit TFTs show high TSH and low normal Free T4 which suggests she could use a dosage increase.  She has tried higher doses in the past and did not tolerate.  However, we have not tried alternating doses.  She is willing to try alternating Levothyroxine  88 mcg and 100 mcg daily to see if we can achiever control over both TSH and Free T4 and help improve her overall symptoms.  - We discussed about correct intake of levothyroxine , at fasting, with water, separated by at least 30 minutes from breakfast, and separated by more than 4 hours from calcium, iron, multivitamins, acid reflux medications (PPIs). -Patient is made aware of the fact that thyroid  hormone replacement is needed for life, dose to be adjusted by periodic monitoring of thyroid  function tests.    -Due to absence of clinical goiter and essentially normal US  from a year ago, no need for additional thyroid  ultrasound  at this time.       I spent  46  minutes in the care of the patient today including review of labs from Thyroid  Function, CMP, and other relevant labs ; imaging/biopsy records (current and previous including abstractions from other facilities); face-to-face time discussing  her lab results and symptoms, medications doses, her options of short and long term treatment based on the latest standards of care / guidelines;   and documenting the encounter.  Larene Pleasant  participated in the discussions, expressed understanding, and voiced agreement with the above plans.  All questions were answered to her satisfaction. she is encouraged to contact clinic should she have any questions or concerns prior to her return visit.   FOLLOW UP PLAN:  Return in about 3 months (around 07/29/2024) for Thyroid  follow up, Previsit labs.  Hulon Magic, FNP-BC [facility] [address] [place],  [place] [address] Phone: [phone] Fax: [phone]  04/28/2024, 4:21 PM

## 2024-04-28 NOTE — Patient Instructions (Signed)

## 2024-05-13 ENCOUNTER — Encounter: Payer: Self-pay | Admitting: Cardiology

## 2024-05-17 ENCOUNTER — Encounter (INDEPENDENT_AMBULATORY_CARE_PROVIDER_SITE_OTHER): Payer: Self-pay | Admitting: Gastroenterology

## 2024-07-07 ENCOUNTER — Other Ambulatory Visit: Payer: Self-pay | Admitting: *Deleted

## 2024-07-07 ENCOUNTER — Telehealth: Payer: Self-pay | Admitting: *Deleted

## 2024-07-07 DIAGNOSIS — K909 Intestinal malabsorption, unspecified: Secondary | ICD-10-CM

## 2024-07-07 DIAGNOSIS — E039 Hypothyroidism, unspecified: Secondary | ICD-10-CM

## 2024-07-07 DIAGNOSIS — E063 Autoimmune thyroiditis: Secondary | ICD-10-CM

## 2024-07-07 NOTE — Telephone Encounter (Signed)
 Patient was called and lab orders for TSH,T4,Free have been updated. Patient will go today or tomorrow and have the lab work drawn.

## 2024-07-07 NOTE — Telephone Encounter (Signed)
 Patient called. She states that she has a couple of questions about her thyroid  medications. She states that since Whitney changed her thyroid  medication alternating  it between 88-100 mcg. She has been sweating, lethargic, dizzy, body aches, just like flu symptoms.  She recently, saw PCP at [facility] , they did lab work but they have not responded back to her with this those results.  Patient states that the medication change is the only thing that has really changed.She would like to know what to do?

## 2024-07-07 NOTE — Telephone Encounter (Signed)
 Hard to say what may be going on.  Those are very generic symptoms which could be stemming from something entirely different from the thyroid  issue.  However, since the symptoms started around the time of the dosage adjustment, it could be the main contributor.  Did [facility] check thyroid  labs?  If not, have her repeat them now so we can take a look.  I don't want to adjust the meds if labs are at goal and potentially cause a different issue.

## 2024-07-20 ENCOUNTER — Telehealth (INDEPENDENT_AMBULATORY_CARE_PROVIDER_SITE_OTHER): Payer: Self-pay | Admitting: Gastroenterology

## 2024-07-20 NOTE — Telephone Encounter (Signed)
 Fax from [facility] stating pt order for Sucraid  8500 unit/ml was processed at [facility] and will be shipped to your pt for delivery on 07/21/24  Copay $0.00

## 2024-08-10 LAB — T4, FREE: Free T4: 1.11 ng/dL (ref 0.82–1.77)

## 2024-08-10 LAB — TSH: TSH: 7.84 u[IU]/mL — ABNORMAL HIGH (ref 0.450–4.500)

## 2024-08-11 ENCOUNTER — Encounter: Payer: Self-pay | Admitting: Nurse Practitioner

## 2024-08-11 ENCOUNTER — Ambulatory Visit: Admitting: Nurse Practitioner

## 2024-08-11 VITALS — BP 116/72 | HR 105 | Ht 68.0 in | Wt 242.6 lb

## 2024-08-11 DIAGNOSIS — K909 Intestinal malabsorption, unspecified: Secondary | ICD-10-CM

## 2024-08-11 DIAGNOSIS — E063 Autoimmune thyroiditis: Secondary | ICD-10-CM | POA: Diagnosis not present

## 2024-08-11 DIAGNOSIS — E7431 Sucrase-isomaltase deficiency: Secondary | ICD-10-CM | POA: Diagnosis not present

## 2024-08-11 MED ORDER — LEVOTHYROXINE SODIUM 100 MCG PO TABS: 100.0000 ug | ORAL_TABLET | ORAL | 1 refills | Status: DC

## 2024-08-11 MED ORDER — LEVOTHYROXINE SODIUM 88 MCG PO TABS: 88.0000 ug | ORAL_TABLET | ORAL | 1 refills | Status: DC

## 2024-08-11 NOTE — Progress Notes (Signed)
 Endocrinology Follow Up Note                                         08/11/2024, 3:06 PM  Subjective:   Subjective    Gwendolyn Glass is a 43 y.o.-year-old female patient being seen in follow up after being seen in consultation for hypothyroidism referred by Dow Longs, PA-C.   Past Medical History:  Diagnosis Date   Allergy     Allergy  to alpha-gal    Allergy  to alpha-gal    Anxiety    [place] recluse spider bite 02/27/2016   C. difficile diarrhea 02/29/2016   Depression    GERD (gastroesophageal reflux disease)    Hypertension    Hypokalemia    Miscarriage    pt. states shes  spotting  and wearing a tampon   Ovarian mass, right 03/01/2016   ? complex cyst   Pregnant    Thyroid  disease     Past Surgical History:  Procedure Laterality Date   ABDOMINAL HYSTERECTOMY N/A 09/16/2018   Procedure: HYSTERECTOMY ABDOMINAL;  Surgeon: Jayne Vonn DEL, MD;  Location: [facility];  Service: Gynecology;  Laterality: N/A;   APPENDECTOMY     CESAREAN SECTION     CHOLECYSTECTOMY     CHOLECYSTECTOMY  07/28/2012   Procedure: LAPAROSCOPIC CHOLECYSTECTOMY;  Surgeon: Thresa JAYSON Pulling, MD;  Location: [facility];  Service: General;  Laterality: N/A;   LAPAROSCOPY N/A 09/07/2014   Procedure: LAPAROSCOPY OPERATIVE;  Surgeon: Lynwood KANDICE Solomons, MD;  Location: [facility];  Service: Gynecology;  Laterality: N/A;   removal of lt fallopian tube     UNILATERAL SALPINGECTOMY Right 09/07/2014   Procedure: UNILATERAL SALPINGECTOMY;  Surgeon: Lynwood KANDICE Solomons, MD;  Location: [facility];  Service: Gynecology;  Laterality: Right;   WOUND DEBRIDEMENT Right 02/28/2016   Procedure: DEBRIDEMENT OF RIGHT SHOULDER SPIDER BITE;  Surgeon: Oneil Budge, MD;  Location: [facility];  Service: General;  Laterality: Right;    Social History   Socioeconomic History   Marital status: Married    Spouse name: Not on file   Number of children: Not on file   Years of education: Not  on file   Highest education level: Not on file  Occupational History   Not on file  Tobacco Use   Smoking status: Former    Current packs/day: 0.50    Average packs/day: 0.5 packs/day for 1 year (0.5 ttl pk-yrs)    Types: Cigarettes    Passive exposure: Past   Smokeless tobacco: Never  Vaping Use   Vaping status: Never Used  Substance and Sexual Activity   Alcohol use: Not Currently   Drug use: No   Sexual activity: Not Currently    Birth control/protection: Surgical    Comment: hyst  Other Topics Concern   Not on file  Social History Narrative   Not on file   Social Drivers of Health   Financial Resource Strain: Not on file  Food Insecurity: Not on file  Transportation Needs: Not on file  Physical Activity: Not on file  Stress: Not on file  Social Connections: Not on file    Family History  Problem Relation Age of Onset   Lung cancer Father 16   Colon cancer Neg Hx    Esophageal cancer Neg Hx    Rectal cancer Neg Hx    Stomach cancer Neg Hx    Adrenal disorder Neg Hx    Liver disease Neg Hx    Inflammatory bowel disease Neg Hx    Pancreatic cancer Neg Hx     Outpatient Encounter Medications as of 08/11/2024  Medication Sig   ALPRAZolam  (XANAX ) 1 MG tablet Take 1 mg by mouth 2 (two) times daily as needed for anxiety or sleep.   buPROPion (WELLBUTRIN XL) 150 MG 24 hr tablet Take 150 mg by mouth every morning.   cholestyramine  light (PREVALITE ) 4 g packet Take 1 packet (4 g total) by mouth 2 (two) times daily.   colesevelam  (WELCHOL ) 625 MG tablet Take 1 tablet (625 mg total) by mouth 2 (two) times daily with a meal.   cromolyn (GASTROCROM) 100 MG/5ML solution Take by mouth as directed. Takes 8 liquid bottles per day   Cyanocobalamin 1000 MCG/ML KIT Inject 1,000 mcg as directed once a week. Patient is injecting every two weeks.   EPINEPHrine  (EPIPEN  IJ) Inject as directed. As needed   ergocalciferol  (VITAMIN D2) 1.25 MG (50000 UT) capsule 50,000 Units. Patient  takes 50,000 units by mouth twice weekly   escitalopram  (LEXAPRO ) 20 MG tablet Take 20 mg by mouth daily.   famotidine (PEPCID) 40 MG tablet Take 40 mg by mouth daily.   KETOTIFEN FUMARATE OP Take 1 mg by mouth as needed (for cells).   loperamide  (IMODIUM  A-D) 2 MG tablet Take 1 tablet (2 mg total) by mouth as needed for diarrhea or loose stools.   metoprolol  tartrate (LOPRESSOR ) 25 MG tablet Take 0.5 tablets (12.5 mg total) by mouth 2 (two) times daily as needed (palpitations).   omeprazole (PRILOSEC) 40 MG capsule Take 40 mg by mouth in the morning and at bedtime.   potassium chloride  (KLOR-CON ) 20 MEQ packet Take 2 packets by mouth daily.   promethazine  (PHENERGAN ) 25 MG tablet Take 25 mg by mouth every 8 (eight) hours as needed for nausea or vomiting.   psyllium (METAMUCIL) 58.6 % packet Take 1 packet by mouth 3 (three) times daily.   Sacrosidase  (SUCRAID ) 8500 UNIT/ML SOLN Take 1 mL (8,500 Units total) by mouth 3 (three) times daily.   [DISCONTINUED] levothyroxine  (SYNTHROID ) 100 MCG tablet Take 1 tablet (100 mcg total) by mouth every other day.   [DISCONTINUED] levothyroxine  (SYNTHROID ) 88 MCG tablet Take 1 tablet (88 mcg total) by mouth every other day.   levothyroxine  (SYNTHROID ) 100 MCG tablet Take 1 tablet (100 mcg total) by mouth every other day.   levothyroxine  (SYNTHROID ) 88 MCG tablet Take 1 tablet (88 mcg total) by mouth every other day.   No facility-administered encounter medications on file as of 08/11/2024.    ALLERGIES: Allergies  Allergen Reactions   Alpha-Gal Anaphylaxis   Cinnamon Swelling and Other (See Comments)    REACTION: throat swells   Bee Venom Swelling and Other (See Comments)    REACTION: All over body swelling   Vicodin [Hydrocodone -Acetaminophen ] Nausea And Vomiting   Benadryl  [Diphenhydramine ]     Couldn't talk or function with injectable- pill gives chest pain and hives and SOB   Dilaudid  [Hydromorphone  Hcl] Hives   Toradol  [Ketorolac  Tromethamine ]  Hives, and chest pain   VACCINATION STATUS: Immunization History  Administered Date(s) Administered   Influenza,inj,Quad PF,6+ Mos 02/28/2016   Moderna Sars-Covid-2 Vaccination 02/08/2020, 03/07/2020     HPI   Gwendolyn Glass is a patient with the above medical history. she was diagnosed with hypothyroidism which required subsequent initiation of thyroid  hormone replacement therapy. she was given various doses of Levothyroxine , branded-Synthroid , and Tirosint  over the years, currently on Levothyroxine  solution 137 micrograms. she reports compliance to this medication:  Taking it daily on empty stomach with water, separated by >30 minutes before breakfast and other medications, and by at least 4 hours from calcium, iron, PPIs, multivitamins.  She notes she is taking cholestyramine  daily to help with absorption problems.  She describes her disease course as starting out as diarrhea which came after every meal or liquid.  Then she was diagnosed with alpha-gal about 2 years ago and then, slowly she started to develop other symptoms, dizziness, abdominal cramping, headaches, near syncopal episodes.  She has seen many different specialists, was being worked up for POTS but wanted to have her thyroid  managed first at it may be affecting her other symptoms.  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  She has not yet started taking the medication at the times suggested by her GI specialist, wanted to see what we said first..   I reviewed patient's thyroid  tests:  Lab Results  Component Value Date   TSH 7.840 (H) 08/09/2024   TSH 12.000 (H) 04/19/2024   TSH 6.440 (H) 01/26/2024   TSH 8.370 (H) 10/14/2023   TSH 15.400 (H) 07/17/2023   TSH 9.510 (H) 04/15/2023   TSH 10.300 (H) 02/11/2023   TSH 24.300 (H) 12/12/2022   TSH 6.420 (H) 10/08/2022   TSH 17.10 (A) 08/17/2022   FREET4  1.11 08/09/2024   FREET4 1.01 04/19/2024   FREET4 1.16 01/26/2024   FREET4 1.17 10/14/2023   FREET4 0.94 07/17/2023   FREET4 1.04 04/15/2023   FREET4 0.90 02/11/2023   FREET4 0.87 12/12/2022   FREET4 1.11 10/08/2022   FREET4 0.90 03/07/2021    Pt denies feeling nodules in neck, hoarseness, dysphagia/odynophagia, SOB with lying down.  she does not know of family history of thyroid  disorders including cancer.  No history of radiation therapy to head or neck.  No recent use of iodine supplements.  Denies use of Biotin containing supplements.  She has had thyroid  ultrasound in August 2022 which was normal with exception of noting cell heterogeneity.  I reviewed her chart and she also has a history of GERD, tobacco abuse, malabsorption issues.   ROS:  Constitutional: + decreasing body weight, + fatigue, no subjective hyperthermia, no subjective hypothermia, chronic headache Eyes: no blurry vision, no xerophthalmia ENT: no sore throat, no nodules palpated in throat, no dysphagia/odynophagia, no hoarseness Cardiovascular: no chest pain, no SOB, + intermittent palpitations, no leg swelling, dizziness with position changes (being worked up for POTS/dysautonomia) Respiratory: no cough, no SOB Gastrointestinal: no nausea/vomiting, + chronic diarrhea triggered by any oral intake-somewhat improved Musculoskeletal: no muscle/joint aches, intermittent diffuse muscle spasms-- ongoing eval with neurology Skin: no rashes Neurological: no tremors, no numbness, no tingling, + intermittent dizziness Psychiatric: no depression, no anxiety   Objective:   Objective     BP 116/72 (BP Location: Right Arm, Patient Position: Sitting, Cuff Size: Large)   Pulse (!) 105  Ht 5' 8 (1.727 m)   Wt 242 lb 9.6 oz (110 kg)   LMP 04/11/2017 (Exact Date) Comment: has had continual periods  BMI 36.89 kg/m  Wt Readings from Last 3 Encounters:  08/11/24 242 lb 9.6 oz (110 kg)  04/28/24 247 lb (112 kg)   01/29/24 254 lb 9.6 oz (115.5 kg)    BP Readings from Last 3 Encounters:  08/11/24 116/72  04/28/24 120/86  01/29/24 112/72      Physical Exam- Limited  Constitutional:  Body mass index is 36.89 kg/m. , not in acute distress, normal state of mind Eyes:  EOMI, no exophthalmos Musculoskeletal: no gross deformities, strength intact in all four extremities, no gross restriction of joint movements Skin:  no rashes, no hyperemia Neurological: no tremor with outstretched hands    CMP ( most recent) CMP     Component Value Date/Time   NA 142 12/25/2022 1150   NA 142 07/26/2022 1115   K 4.0 12/25/2022 1150   CL 107 12/25/2022 1150   CO2 26 12/25/2022 1150   GLUCOSE 83 12/25/2022 1150   BUN 8 12/25/2022 1150   BUN 8 07/26/2022 1115   CREATININE 0.83 12/25/2022 1150   CALCIUM 9.2 12/25/2022 1150   PROT 6.8 12/25/2022 1150   PROT 6.6 12/25/2022 1150   PROT 6.0 09/28/2020 1443   ALBUMIN 4.2 06/22/2021 1125   AST 26 12/25/2022 1150   ALT 40 (H) 12/25/2022 1150   ALKPHOS 83 06/22/2021 1125   BILITOT 0.4 12/25/2022 1150   GFRNONAA >60 11/10/2020 1622   GFRAA >60 09/17/2018 0525     Diabetic Labs (most recent): No results found for: HGBA1C, MICROALBUR   Lipid Panel ( most recent) Lipid Panel  No results found for: CHOL, TRIG, HDL, CHOLHDL, VLDL, LDLCALC, LDLDIRECT, LABVLDL     Lab Results  Component Value Date   TSH 7.840 (H) 08/09/2024   TSH 12.000 (H) 04/19/2024   TSH 6.440 (H) 01/26/2024   TSH 8.370 (H) 10/14/2023   TSH 15.400 (H) 07/17/2023   TSH 9.510 (H) 04/15/2023   TSH 10.300 (H) 02/11/2023   TSH 24.300 (H) 12/12/2022   TSH 6.420 (H) 10/08/2022   TSH 17.10 (A) 08/17/2022   FREET4 1.11 08/09/2024   FREET4 1.01 04/19/2024   FREET4 1.16 01/26/2024   FREET4 1.17 10/14/2023   FREET4 0.94 07/17/2023   FREET4 1.04 04/15/2023   FREET4 0.90 02/11/2023   FREET4 0.87 12/12/2022   FREET4 1.11 10/08/2022   FREET4 0.90 03/07/2021       Thyroid  US  from 07/30/22 US  Thyroid   Anatomical Region Laterality Modality  Neck -- Ultrasound   Impression  Thyroid  heterogeneity without discrete nodule.  The above is in keeping with the ACR TI-RADS recommendations - J Am Coll Radiol 2017;14:587-595.   Electronically Signed   By: CHRISTELLA.  Shick M.D.   On: 07/30/2021 13:33 Narrative  CLINICAL DATA:  Palpable left thyroid  nodule, neck fullness  EXAM: THYROID  ULTRASOUND  TECHNIQUE: Ultrasound examination of the thyroid  gland and adjacent soft tissues was performed.  COMPARISON:  None.  FINDINGS: Parenchymal Echotexture: Moderately heterogenous  Isthmus: 3 mm  Right lobe: 4.3 x 1.8 x 1.3 cm  Left lobe: 3.6 x 1.8 x 1.3 cm  _________________________________________________________  Estimated total number of nodules >/= 1 cm: 0  Number of spongiform nodules >/=  2 cm not described below (TR1): 0  Number of mixed cystic and solid nodules >/= 1.5 cm not described below (TR2): 0  _________________________________________________________  Nonspecific thyroid  heterogeneity  suggesting medical thyroid  disease. No hypervascularity or nodule. No adenopathy. Procedure Note  Vanice Ozell Revel, MD - 07/30/2021 Formatting of this note might be different from the original. CLINICAL DATA:  Palpable left thyroid  nodule, neck fullness  EXAM: THYROID  ULTRASOUND  TECHNIQUE: Ultrasound examination of the thyroid  gland and adjacent soft tissues was performed.  COMPARISON:  None.  FINDINGS: Parenchymal Echotexture: Moderately heterogenous  Isthmus: 3 mm  Right lobe: 4.3 x 1.8 x 1.3 cm  Left lobe: 3.6 x 1.8 x 1.3 cm  _________________________________________________________  Estimated total number of nodules >/= 1 cm: 0  Number of spongiform nodules >/=  2 cm not described below (TR1): 0  Number of mixed cystic and solid nodules >/= 1.5 cm not described below (TR2):  0  _________________________________________________________  Nonspecific thyroid  heterogeneity suggesting medical thyroid  disease. No hypervascularity or nodule. No adenopathy.  IMPRESSION: Thyroid  heterogeneity without discrete nodule.  The above is in keeping with the ACR TI-RADS recommendations - J Am Coll Radiol 2017;14:587-595.   Electronically Signed   By: CHRISTELLA.  Shick M.D.   On: 07/30/2021 13:33 Exam End: 07/30/21 13:32   Specimen Collected: 07/30/21 13:32 Last Resulted: 07/30/21 13:33  Received From: [facility]      Latest Reference Range & Units 01/26/24 16:20 04/19/24 13:51 08/09/24 15:39  TSH 0.450 - 4.500 uIU/mL 6.440 (H) 12.000 (H) 7.840 (H)  T4,Free(Direct) 0.82 - 1.77 ng/dL 8.83 8.98 8.88  (H): Data is abnormally high   Assessment & Plan:   ASSESSMENT / PLAN:  1. Hypothyroidism-r/t Hashimotos thyroiditis  Patient with long-standing hypothyroidism, on thyroid  hormone replacement therapy. On physical exam, patient does not have gross goiter, thyroid  nodules, or neck compression symptoms.  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  Her previsit TFTs show some improvement with normal Free T4 and improved TSH. She is advised to continue alternating Levothyroxine  88 mcg and 100 mcg daily to see if we can achiever control over both TSH and Free T4 and help improve her overall symptoms.  - We discussed about correct intake of levothyroxine , at fasting, with water, separated by at least 30 minutes from breakfast, and separated by more than 4 hours from calcium, iron, multivitamins, acid reflux medications (PPIs). -Patient is made aware of the fact that thyroid  hormone replacement is needed for life, dose to be adjusted by periodic monitoring of thyroid  function tests.    -Due to absence of clinical goiter and essentially normal US  from a year ago, no need for additional thyroid  ultrasound at this  time.    I spent  17  minutes in the care of the patient today including review of labs from Thyroid  Function, CMP, and other relevant labs ; imaging/biopsy records (current and previous including abstractions from other facilities); face-to-face time discussing  her lab results and symptoms, medications doses, her options of short and long term treatment based on the latest standards of care / guidelines;   and documenting the encounter.  Gwendolyn Glass  participated in the discussions, expressed understanding, and voiced agreement with the above plans.  All questions were answered to her satisfaction. she is encouraged to contact clinic should she have any questions or concerns prior to her return visit.   FOLLOW UP PLAN:  Return in about 3 months (around 11/10/2024) for Thyroid  follow up, Previsit labs.  Benton Rio, [facility] [address] [place], [place] [address] Phone: [phone] Fax: [phone]  08/11/2024, 3:06 PM

## 2024-08-11 NOTE — Patient Instructions (Signed)

## 2024-09-10 ENCOUNTER — Other Ambulatory Visit: Payer: Self-pay | Admitting: Medical Genetics

## 2024-09-10 DIAGNOSIS — Z006 Encounter for examination for normal comparison and control in clinical research program: Secondary | ICD-10-CM

## 2024-09-13 ENCOUNTER — Telehealth (INDEPENDENT_AMBULATORY_CARE_PROVIDER_SITE_OTHER): Payer: Self-pay | Admitting: Gastroenterology

## 2024-09-13 NOTE — Telephone Encounter (Signed)
 Fax from [facility] stating that order for Sucraid  SD 2500 unit/ml sol was processed at [facility] and will be shipped to your patient for delivery on 09/15/24. Copay $0.00

## 2024-11-01 ENCOUNTER — Other Ambulatory Visit (INDEPENDENT_AMBULATORY_CARE_PROVIDER_SITE_OTHER): Payer: Self-pay

## 2024-11-01 ENCOUNTER — Telehealth (INDEPENDENT_AMBULATORY_CARE_PROVIDER_SITE_OTHER): Payer: Self-pay | Admitting: Gastroenterology

## 2024-11-01 DIAGNOSIS — E7431 Sucrase-isomaltase deficiency: Secondary | ICD-10-CM

## 2024-11-01 MED ORDER — SUCRAID 8500 UNIT/ML PO SOLN: 8500.0000 [IU] | Freq: Three times a day (TID) | ORAL | 1 refills | Status: AC

## 2024-11-01 NOTE — Telephone Encounter (Signed)
 Fax received from [facility] stating patient prescription for Sucraid  has no refills remaining or has expired. Please fax a new prescription or escripts so that we can ship your patients medication to them without a lapse in therapy. Refill sent to [facility]

## 2024-11-03 NOTE — Telephone Encounter (Signed)
 Fax from [facility] stating the received a new prescription for sucraid  and needing clarification. The directions and quantity have decreased since patients last fill. Previously pt was given standard directions (Qty 360ml (180 vials) Directions take 2ml po up to 6 times daily with meals and snacks)).  Refill was sent in Monday for Sucraid  take 1 ml po TID. I reordered the same script that was in chart. Please verify that pt instructions are to be 1 mL (8500 units total) po TID. Thank you!!

## 2024-11-09 NOTE — Telephone Encounter (Signed)
 That's correct , same dose as previously

## 2024-11-09 NOTE — Patient Instructions (Signed)

## 2024-11-10 ENCOUNTER — Encounter: Payer: Self-pay | Admitting: Nurse Practitioner

## 2024-11-10 ENCOUNTER — Ambulatory Visit: Admitting: Nurse Practitioner

## 2024-11-10 VITALS — BP 118/76 | HR 72 | Ht 68.0 in | Wt 259.2 lb

## 2024-11-10 DIAGNOSIS — E063 Autoimmune thyroiditis: Secondary | ICD-10-CM

## 2024-11-10 LAB — TSH: TSH: 10.4 u[IU]/mL — ABNORMAL HIGH (ref 0.450–4.500)

## 2024-11-10 LAB — T4, FREE: Free T4: 0.84 ng/dL (ref 0.82–1.77)

## 2024-11-10 MED ORDER — LEVOTHYROXINE SODIUM 100 MCG PO TABS: 100.0000 ug | ORAL_TABLET | Freq: Every day | ORAL | 1 refills | Status: AC

## 2024-11-10 NOTE — Progress Notes (Signed)
 Endocrinology Follow Up Note                                         11/10/2024, 3:31 PM  Subjective:   Subjective    Gwendolyn Glass is a 43 y.o.-year-old female patient being seen in follow up after being seen in consultation for hypothyroidism referred by Dow Longs, PA-C.   Past Medical History:  Diagnosis Date   Allergy     Allergy  to alpha-gal    Allergy  to alpha-gal    Anxiety    [place] recluse spider bite 02/27/2016   C. difficile diarrhea 02/29/2016   Depression    GERD (gastroesophageal reflux disease)    Hypertension    Hypokalemia    Miscarriage    pt. states shes  spotting  and wearing a tampon   Ovarian mass, right 03/01/2016   ? complex cyst   Pregnant    Thyroid  disease     Past Surgical History:  Procedure Laterality Date   ABDOMINAL HYSTERECTOMY N/A 09/16/2018   Procedure: HYSTERECTOMY ABDOMINAL;  Surgeon: Jayne Vonn DEL, MD;  Location: [facility];  Service: Gynecology;  Laterality: N/A;   APPENDECTOMY     CESAREAN SECTION     CHOLECYSTECTOMY     CHOLECYSTECTOMY  07/28/2012   Procedure: LAPAROSCOPIC CHOLECYSTECTOMY;  Surgeon: Thresa JAYSON Pulling, MD;  Location: [facility];  Service: General;  Laterality: N/A;   LAPAROSCOPY N/A 09/07/2014   Procedure: LAPAROSCOPY OPERATIVE;  Surgeon: Lynwood KANDICE Solomons, MD;  Location: [facility];  Service: Gynecology;  Laterality: N/A;   removal of lt fallopian tube     UNILATERAL SALPINGECTOMY Right 09/07/2014   Procedure: UNILATERAL SALPINGECTOMY;  Surgeon: Lynwood KANDICE Solomons, MD;  Location: [facility];  Service: Gynecology;  Laterality: Right;   WOUND DEBRIDEMENT Right 02/28/2016   Procedure: DEBRIDEMENT OF RIGHT SHOULDER SPIDER BITE;  Surgeon: Oneil Budge, MD;  Location: [facility];  Service: General;  Laterality: Right;    Social History   Socioeconomic History   Marital status: Married    Spouse name: Not on file   Number of children: Not on file   Years of education: Not  on file   Highest education level: Not on file  Occupational History   Not on file  Tobacco Use   Smoking status: Former    Current packs/day: 0.50    Average packs/day: 0.5 packs/day for 1 year (0.5 ttl pk-yrs)    Types: Cigarettes    Passive exposure: Past   Smokeless tobacco: Never  Vaping Use   Vaping status: Never Used  Substance and Sexual Activity   Alcohol use: Not Currently   Drug use: No   Sexual activity: Not Currently    Birth control/protection: Surgical    Comment: hyst  Other Topics Concern   Not on file  Social History Narrative   Not on file   Social Drivers of Health   Financial Resource Strain: Not on file  Food Insecurity: Not on file  Transportation Needs: Not on file  Physical Activity: Not on file  Stress: Not on file  Social Connections: Not on file    Family History  Problem Relation Age of Onset   Lung cancer Father 61   Colon cancer Neg Hx    Esophageal cancer Neg Hx    Rectal cancer Neg Hx    Stomach cancer Neg Hx    Adrenal disorder Neg Hx    Liver disease Neg Hx    Inflammatory bowel disease Neg Hx    Pancreatic cancer Neg Hx     Outpatient Encounter Medications as of 11/10/2024  Medication Sig   ALPRAZolam  (XANAX ) 1 MG tablet Take 1 mg by mouth 2 (two) times daily as needed for anxiety or sleep.   buPROPion (WELLBUTRIN XL) 150 MG 24 hr tablet Take 150 mg by mouth every morning.   cholestyramine  light (PREVALITE ) 4 g packet Take 1 packet (4 g total) by mouth 2 (two) times daily.   colesevelam  (WELCHOL ) 625 MG tablet Take 1 tablet (625 mg total) by mouth 2 (two) times daily with a meal.   cromolyn (GASTROCROM) 100 MG/5ML solution Take by mouth as directed. Takes 8 liquid bottles per day   Cyanocobalamin 1000 MCG/ML KIT Inject 1,000 mcg as directed once a week. Patient is injecting every two weeks.   EPINEPHrine  (EPIPEN  IJ) Inject as directed. As needed   ergocalciferol  (VITAMIN D2) 1.25 MG (50000 UT) capsule 50,000 Units. Patient  takes 50,000 units by mouth twice weekly   escitalopram  (LEXAPRO ) 20 MG tablet Take 20 mg by mouth daily.   famotidine (PEPCID) 40 MG tablet Take 40 mg by mouth daily.   KETOTIFEN FUMARATE OP Take 1 mg by mouth as needed (for cells).   loperamide  (IMODIUM  A-D) 2 MG tablet Take 1 tablet (2 mg total) by mouth as needed for diarrhea or loose stools.   metoprolol  tartrate (LOPRESSOR ) 25 MG tablet Take 0.5 tablets (12.5 mg total) by mouth 2 (two) times daily as needed (palpitations).   omeprazole (PRILOSEC) 40 MG capsule Take 40 mg by mouth in the morning and at bedtime.   potassium chloride  (KLOR-CON ) 20 MEQ packet Take 2 packets by mouth daily.   promethazine  (PHENERGAN ) 25 MG tablet Take 25 mg by mouth every 8 (eight) hours as needed for nausea or vomiting.   psyllium (METAMUCIL) 58.6 % packet Take 1 packet by mouth 3 (three) times daily.   Sacrosidase  (SUCRAID ) 8500 UNIT/ML SOLN Take 1 mL (8,500 Units total) by mouth 3 (three) times daily.   [DISCONTINUED] levothyroxine  (SYNTHROID ) 100 MCG tablet Take 1 tablet (100 mcg total) by mouth every other day.   [DISCONTINUED] levothyroxine  (SYNTHROID ) 88 MCG tablet Take 1 tablet (88 mcg total) by mouth every other day.   levothyroxine  (SYNTHROID ) 100 MCG tablet Take 1 tablet (100 mcg total) by mouth daily before breakfast.   No facility-administered encounter medications on file as of 11/10/2024.    ALLERGIES: Allergies  Allergen Reactions   Alpha-Gal Anaphylaxis   Cinnamon Swelling and Other (See Comments)    REACTION: throat swells   Bee Venom Swelling and Other (See Comments)    REACTION: All over body swelling   Vicodin [Hydrocodone -Acetaminophen ] Nausea And Vomiting   Benadryl  [Diphenhydramine ]     Couldn't talk or function with injectable- pill gives chest pain and hives and SOB   Dilaudid  [Hydromorphone  Hcl] Hives   Toradol  [Ketorolac  Tromethamine ]     Hives, and chest pain   VACCINATION STATUS: Immunization History  Administered  Date(s) Administered   Influenza,inj,Quad PF,6+  Mos 02/28/2016   Moderna Sars-Covid-2 Vaccination 02/08/2020, 03/07/2020     HPI   Gwendolyn Glass is a patient with the above medical history. she was diagnosed with hypothyroidism which required subsequent initiation of thyroid  hormone replacement therapy. she was given various doses of Levothyroxine , branded-Synthroid , and Tirosint  over the years, currently on Levothyroxine  solution 137 micrograms. she reports compliance to this medication:  Taking it daily on empty stomach with water, separated by >30 minutes before breakfast and other medications, and by at least 4 hours from calcium, iron, PPIs, multivitamins.  She notes she is taking cholestyramine  daily to help with absorption problems.  She describes her disease course as starting out as diarrhea which came after every meal or liquid.  Then she was diagnosed with alpha-gal about 2 years ago and then, slowly she started to develop other symptoms, dizziness, abdominal cramping, headaches, near syncopal episodes.  She has seen many different specialists, was being worked up for POTS but wanted to have her thyroid  managed first at it may be affecting her other symptoms.  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  She has not yet started taking the medication at the times suggested by her GI specialist, wanted to see what we said first..   I reviewed patient's thyroid  tests:  Lab Results  Component Value Date   TSH 10.400 (H) 11/09/2024   TSH 7.840 (H) 08/09/2024   TSH 12.000 (H) 04/19/2024   TSH 6.440 (H) 01/26/2024   TSH 8.370 (H) 10/14/2023   TSH 15.400 (H) 07/17/2023   TSH 9.510 (H) 04/15/2023   TSH 10.300 (H) 02/11/2023   TSH 24.300 (H) 12/12/2022   TSH 6.420 (H) 10/08/2022   FREET4 0.84 11/09/2024   FREET4 1.11 08/09/2024   FREET4 1.01 04/19/2024   FREET4 1.16  01/26/2024   FREET4 1.17 10/14/2023   FREET4 0.94 07/17/2023   FREET4 1.04 04/15/2023   FREET4 0.90 02/11/2023   FREET4 0.87 12/12/2022   FREET4 1.11 10/08/2022    Pt denies feeling nodules in neck, hoarseness, dysphagia/odynophagia, SOB with lying down.  she does not know of family history of thyroid  disorders including cancer.  No history of radiation therapy to head or neck.  No recent use of iodine supplements.  Denies use of Biotin containing supplements.  She has had thyroid  ultrasound in August 2022 which was normal with exception of noting cell heterogeneity.  I reviewed her chart and she also has a history of GERD, tobacco abuse, malabsorption issues.   ROS:  Constitutional: + fluctuating body weight, + fatigue-stable, no subjective hyperthermia, no subjective hypothermia, chronic headache Eyes: no blurry vision, no xerophthalmia ENT: no sore throat, no nodules palpated in throat, no dysphagia/odynophagia, no hoarseness Cardiovascular: no chest pain, no SOB, + intermittent palpitations-improved, no leg swelling, dizziness with position changes (being worked up for POTS/dysautonomia) Respiratory: no cough, no SOB Gastrointestinal: no nausea/vomiting, + chronic diarrhea triggered by any oral intake-somewhat improved Musculoskeletal: no muscle/joint aches, intermittent diffuse muscle spasms-- ongoing eval with neurology Skin: no rashes Neurological: no tremors, no numbness, no tingling, + intermittent dizziness Psychiatric: no depression, no anxiety   Objective:   Objective     BP 118/76 (BP Location: Left Arm, Patient Position: Sitting, Cuff Size: Large)   Pulse 72   Ht 5' 8 (1.727 m)   Wt 259 lb 3.2 oz (117.6 kg)   LMP 04/11/2017 (Exact  Date) Comment: has had continual periods  BMI 39.41 kg/m  Wt Readings from Last 3 Encounters:  11/10/24 259 lb 3.2 oz (117.6 kg)  08/11/24 242 lb 9.6 oz (110 kg)  04/28/24 247 lb (112 kg)    BP Readings from Last 3 Encounters:   11/10/24 118/76  08/11/24 116/72  04/28/24 120/86     Physical Exam- Limited  Constitutional:  Body mass index is 39.41 kg/m. , not in acute distress, normal state of mind Eyes:  EOMI, no exophthalmos Musculoskeletal: no gross deformities, strength intact in all four extremities, no gross restriction of joint movements Skin:  no rashes, no hyperemia Neurological: no tremor with outstretched hands    CMP ( most recent) CMP     Component Value Date/Time   NA 142 12/25/2022 1150   NA 142 07/26/2022 1115   K 4.0 12/25/2022 1150   CL 107 12/25/2022 1150   CO2 26 12/25/2022 1150   GLUCOSE 83 12/25/2022 1150   BUN 8 12/25/2022 1150   BUN 8 07/26/2022 1115   CREATININE 0.83 12/25/2022 1150   CALCIUM 9.2 12/25/2022 1150   PROT 6.8 12/25/2022 1150   PROT 6.6 12/25/2022 1150   PROT 6.0 09/28/2020 1443   ALBUMIN 4.2 06/22/2021 1125   AST 26 12/25/2022 1150   ALT 40 (H) 12/25/2022 1150   ALKPHOS 83 06/22/2021 1125   BILITOT 0.4 12/25/2022 1150   GFRNONAA >60 11/10/2020 1622   GFRAA >60 09/17/2018 0525     Diabetic Labs (most recent): No results found for: HGBA1C, MICROALBUR   Lipid Panel ( most recent) Lipid Panel  No results found for: CHOL, TRIG, HDL, CHOLHDL, VLDL, LDLCALC, LDLDIRECT, LABVLDL     Lab Results  Component Value Date   TSH 10.400 (H) 11/09/2024   TSH 7.840 (H) 08/09/2024   TSH 12.000 (H) 04/19/2024   TSH 6.440 (H) 01/26/2024   TSH 8.370 (H) 10/14/2023   TSH 15.400 (H) 07/17/2023   TSH 9.510 (H) 04/15/2023   TSH 10.300 (H) 02/11/2023   TSH 24.300 (H) 12/12/2022   TSH 6.420 (H) 10/08/2022   FREET4 0.84 11/09/2024   FREET4 1.11 08/09/2024   FREET4 1.01 04/19/2024   FREET4 1.16 01/26/2024   FREET4 1.17 10/14/2023   FREET4 0.94 07/17/2023   FREET4 1.04 04/15/2023   FREET4 0.90 02/11/2023   FREET4 0.87 12/12/2022   FREET4 1.11 10/08/2022      Thyroid  US  from 07/30/22 US  Thyroid   Anatomical Region Laterality Modality   Neck -- Ultrasound   Impression  Thyroid  heterogeneity without discrete nodule.  The above is in keeping with the ACR TI-RADS recommendations - J Am Coll Radiol 2017;14:587-595.   Electronically Signed   By: CHRISTELLA.  Shick M.D.   On: 07/30/2021 13:33 Narrative  CLINICAL DATA:  Palpable left thyroid  nodule, neck fullness  EXAM: THYROID  ULTRASOUND  TECHNIQUE: Ultrasound examination of the thyroid  gland and adjacent soft tissues was performed.  COMPARISON:  None.  FINDINGS: Parenchymal Echotexture: Moderately heterogenous  Isthmus: 3 mm  Right lobe: 4.3 x 1.8 x 1.3 cm  Left lobe: 3.6 x 1.8 x 1.3 cm  _________________________________________________________  Estimated total number of nodules >/= 1 cm: 0  Number of spongiform nodules >/=  2 cm not described below (TR1): 0  Number of mixed cystic and solid nodules >/= 1.5 cm not described below (TR2): 0  _________________________________________________________  Nonspecific thyroid  heterogeneity suggesting medical thyroid  disease. No hypervascularity or nodule. No adenopathy. Procedure Note  Vanice Ozell Revel, MD - 07/30/2021 Formatting  of this note might be different from the original. CLINICAL DATA:  Palpable left thyroid  nodule, neck fullness  EXAM: THYROID  ULTRASOUND  TECHNIQUE: Ultrasound examination of the thyroid  gland and adjacent soft tissues was performed.  COMPARISON:  None.  FINDINGS: Parenchymal Echotexture: Moderately heterogenous  Isthmus: 3 mm  Right lobe: 4.3 x 1.8 x 1.3 cm  Left lobe: 3.6 x 1.8 x 1.3 cm  _________________________________________________________  Estimated total number of nodules >/= 1 cm: 0  Number of spongiform nodules >/=  2 cm not described below (TR1): 0  Number of mixed cystic and solid nodules >/= 1.5 cm not described below (TR2): 0  _________________________________________________________  Nonspecific thyroid  heterogeneity suggesting medical  thyroid  disease. No hypervascularity or nodule. No adenopathy.  IMPRESSION: Thyroid  heterogeneity without discrete nodule.  The above is in keeping with the ACR TI-RADS recommendations - J Am Coll Radiol 2017;14:587-595.   Electronically Signed   By: CHRISTELLA.  Shick M.D.   On: 07/30/2021 13:33 Exam End: 07/30/21 13:32   Specimen Collected: 07/30/21 13:32 Last Resulted: 07/30/21 13:33  Received From: [facility]      Latest Reference Range & Units 01/26/24 16:20 04/19/24 13:51 08/09/24 15:39 11/09/24 14:56  TSH 0.450 - 4.500 uIU/mL 6.440 (H) 12.000 (H) 7.840 (H) 10.400 (H)  T4,Free(Direct) 0.82 - 1.77 ng/dL 8.83 8.98 8.88 9.15  (H): Data is abnormally high   Assessment & Plan:   ASSESSMENT / PLAN:  1. Hypothyroidism-r/t Hashimotos thyroiditis  Patient with long-standing hypothyroidism, on thyroid  hormone replacement therapy. On physical exam, patient does not have gross goiter, thyroid  nodules, or neck compression symptoms.  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  Her previsit TFTs are consistent with slight under-replacement.  Will change her Levothyroxine  to 100 mcg po daily before breakfast (not alternating doses with the 88 mcg any longer).  Will recheck TFTs prior to next visit and adjust dose accordingly.  We did go over s/s of over-replacement to watch for.  - We discussed about correct intake of levothyroxine , at fasting, with water, separated by at least 30 minutes from breakfast, and separated by more than 4 hours from calcium, iron, multivitamins, acid reflux medications (PPIs). -Patient is made aware of the fact that thyroid  hormone replacement is needed for life, dose to be adjusted by periodic monitoring of thyroid  function tests.    -Due to absence of clinical goiter and essentially normal US  from a year ago, no need for additional thyroid  ultrasound at this time.    I spent  33  minutes in the care of  the patient today including review of labs from Thyroid  Function, CMP, and other relevant labs ; imaging/biopsy records (current and previous including abstractions from other facilities); face-to-face time discussing  her lab results and symptoms, medications doses, her options of short and long term treatment based on the latest standards of care / guidelines;   and documenting the encounter.  Eleanor LITTIE Daring  participated in the discussions, expressed understanding, and voiced agreement with the above plans.  All questions were answered to her satisfaction. she is encouraged to contact clinic should she have any questions or concerns prior to her return visit.   FOLLOW UP PLAN:  Return in about 4 months (around 03/11/2025) for Thyroid  follow up, Previsit labs.  Benton Rio, [facility] [address] [place], [place] [address] Phone: [phone] Fax: [phone]  11/10/2024, 3:31 PM

## 2024-11-15 ENCOUNTER — Other Ambulatory Visit (INDEPENDENT_AMBULATORY_CARE_PROVIDER_SITE_OTHER): Payer: Self-pay

## 2024-11-15 DIAGNOSIS — E7431 Sucrase-isomaltase deficiency: Secondary | ICD-10-CM

## 2024-11-15 MED ORDER — SUCRAID 8500 UNIT/ML PO SOLN: 8500.0000 [IU] | Freq: Three times a day (TID) | ORAL | 1 refills | Status: AC

## 2024-11-15 NOTE — Telephone Encounter (Signed)
 Fax from [facility] stating we have been trying to reach your office regarding above patients [facility]  scripts.   I contacted [facility] and spoke with [place]. I advised her that a new script was sent in on 11/01/24. Heather states she does not see anything. Resent script electronically. Heather also connected me with pharmacist Devin and I was able to give a verbal order

## 2024-11-19 ENCOUNTER — Encounter (INDEPENDENT_AMBULATORY_CARE_PROVIDER_SITE_OTHER): Payer: Self-pay

## 2025-03-14 ENCOUNTER — Ambulatory Visit: Admitting: Nurse Practitioner
